# Patient Record
Sex: Female | Born: 1990 | Race: Black or African American | Hispanic: No | Marital: Single | Smoking: Former smoker
Health system: Southern US, Community
[De-identification: ages and names within clinical notes are randomized; demographics above are authoritative.]

## PROBLEM LIST (undated history)

## (undated) ENCOUNTER — Inpatient Hospital Stay (HOSPITAL_COMMUNITY): Payer: Self-pay

## (undated) ENCOUNTER — Inpatient Hospital Stay: Payer: Self-pay

## (undated) DIAGNOSIS — IMO0002 Reserved for concepts with insufficient information to code with codable children: Secondary | ICD-10-CM

## (undated) DIAGNOSIS — E282 Polycystic ovarian syndrome: Principal | ICD-10-CM

## (undated) DIAGNOSIS — D649 Anemia, unspecified: Secondary | ICD-10-CM

## (undated) DIAGNOSIS — O4290 Premature rupture of membranes, unspecified as to length of time between rupture and onset of labor, unspecified weeks of gestation: Secondary | ICD-10-CM

## (undated) DIAGNOSIS — R011 Cardiac murmur, unspecified: Secondary | ICD-10-CM

## (undated) HISTORY — DX: Reserved for concepts with insufficient information to code with codable children: IMO0002

## (undated) HISTORY — DX: Polycystic ovarian syndrome: E28.2

## (undated) HISTORY — DX: Cardiac murmur, unspecified: R01.1

## (undated) HISTORY — DX: Anemia, unspecified: D64.9

## (undated) HISTORY — PX: WISDOM TOOTH EXTRACTION: SHX21

---
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
## 2008-09-27 HISTORY — PX: CHOLECYSTECTOMY: SHX55

## 2008-10-07 ENCOUNTER — Encounter (INDEPENDENT_AMBULATORY_CARE_PROVIDER_SITE_OTHER): Payer: Self-pay | Admitting: Surgery

## 2008-10-07 ENCOUNTER — Ambulatory Visit (HOSPITAL_COMMUNITY): Admission: RE | Admit: 2008-10-07 | Discharge: 2008-10-07 | Payer: Self-pay | Admitting: Surgery

## 2010-04-26 ENCOUNTER — Emergency Department (HOSPITAL_COMMUNITY): Admission: EM | Admit: 2010-04-26 | Discharge: 2010-04-26 | Payer: Self-pay | Admitting: Emergency Medicine

## 2011-02-13 LAB — POCT I-STAT, CHEM 8
Creatinine, Ser: 0.8 mg/dL (ref 0.4–1.2)
Glucose, Bld: 89 mg/dL (ref 70–99)
HCT: 44 % (ref 36.0–46.0)
Potassium: 3.8 mEq/L (ref 3.5–5.1)
Sodium: 138 mEq/L (ref 135–145)
TCO2: 23 mmol/L (ref 0–100)

## 2011-02-13 LAB — URINE MICROSCOPIC-ADD ON

## 2011-02-13 LAB — URINALYSIS, ROUTINE W REFLEX MICROSCOPIC
Glucose, UA: NEGATIVE mg/dL
Ketones, ur: NEGATIVE mg/dL

## 2011-03-15 ENCOUNTER — Emergency Department (HOSPITAL_COMMUNITY)
Admission: EM | Admit: 2011-03-15 | Discharge: 2011-03-16 | Discharge status: Against Medical Advice | Payer: Self-pay | Attending: Emergency Medicine | Admitting: Emergency Medicine

## 2011-03-15 DIAGNOSIS — R109 Unspecified abdominal pain: Secondary | ICD-10-CM | POA: Insufficient documentation

## 2011-03-15 LAB — DIFFERENTIAL
Basophils Absolute: 0 10*3/uL (ref 0.0–0.1)
Basophils Relative: 0 % (ref 0–1)
Eosinophils Absolute: 0.3 10*3/uL (ref 0.0–0.7)
Lymphs Abs: 3.6 10*3/uL (ref 0.7–4.0)
Monocytes Relative: 8 % (ref 3–12)
Neutro Abs: 5.7 10*3/uL (ref 1.7–7.7)
Neutrophils Relative %: 54 % (ref 43–77)

## 2011-03-15 LAB — CBC
HCT: 39 % (ref 36.0–46.0)
Hemoglobin: 13.5 g/dL (ref 12.0–15.0)
MCH: 30.1 pg (ref 26.0–34.0)
MCHC: 34.6 g/dL (ref 30.0–36.0)
RBC: 4.49 MIL/uL (ref 3.87–5.11)
RDW: 12.9 % (ref 11.5–15.5)
WBC: 10.5 10*3/uL (ref 4.0–10.5)

## 2011-03-16 ENCOUNTER — Emergency Department (HOSPITAL_COMMUNITY): Payer: Self-pay

## 2011-03-16 ENCOUNTER — Inpatient Hospital Stay (HOSPITAL_COMMUNITY)
Admission: AD | Admit: 2011-03-16 | Discharge: 2011-03-16 | Disposition: A | Discharge status: Home / Self Care | Payer: Self-pay | Source: Ambulatory Visit | Attending: Obstetrics & Gynecology | Admitting: Obstetrics & Gynecology

## 2011-03-16 ENCOUNTER — Inpatient Hospital Stay (HOSPITAL_COMMUNITY): Payer: Self-pay

## 2011-03-16 DIAGNOSIS — R109 Unspecified abdominal pain: Secondary | ICD-10-CM | POA: Insufficient documentation

## 2011-03-16 LAB — BASIC METABOLIC PANEL
BUN: 5 mg/dL — ABNORMAL LOW (ref 6–23)
Calcium: 8.8 mg/dL (ref 8.4–10.5)
Creatinine, Ser: 0.66 mg/dL (ref 0.4–1.2)
GFR calc Af Amer: >60 mL/min (ref >60)
Glucose, Bld: 86 mg/dL (ref 70–99)
Potassium: 3.5 mEq/L (ref 3.5–5.1)

## 2011-03-16 LAB — POCT PREGNANCY, URINE: Preg Test, Ur: NEGATIVE

## 2011-03-16 LAB — GC/CHLAMYDIA PROBE AMP, GENITAL
Chlamydia, DNA Probe: NEGATIVE
GC Probe Amp, Genital: NEGATIVE

## 2011-03-16 LAB — URINALYSIS, ROUTINE W REFLEX MICROSCOPIC
Ketones, ur: NEGATIVE mg/dL
Leukocytes, UA: NEGATIVE
Protein, ur: NEGATIVE mg/dL
Specific Gravity, Urine: 1.027 (ref 1.005–1.030)
pH: 6 (ref 5.0–8.0)

## 2011-04-11 NOTE — Op Note (Signed)
Sarah Sutton, Sarah Sutton                ACCOUNT NO.:  [id number]   MEDICAL RECORD NO.:  [id number]          PATIENT TYPE:  AMB   LOCATION:  SDS                          FACILITY:  [facility]   PHYSICIAN:  Thomas A. Cornett, M.D.DATE OF BIRTH:  17-Oct-1991   DATE OF PROCEDURE:  10/07/2008  DATE OF DISCHARGE:                               OPERATIVE REPORT   PREOPERATIVE DIAGNOSIS:  Symptomatic cholelithiasis.   POSTOPERATIVE DIAGNOSIS:  Symptomatic cholelithiasis.   PROCEDURE:  Laparoscopic cholecystectomy with intraoperative  cholangiogram.   SURGEON:  Thomas A. Cornett, MD   ANESTHESIA:  General endotracheal anesthesia, 0.25% Sensorcaine local.   ESTIMATED BLOOD LOSS:  20 mL.   SPECIMEN:  Gallbladder gallstone to pathology   INDICATIONS FOR PROCEDURE:  The patient is a 20 year old female with  right upper quadrant pain and gallstones.  She presents for laparoscopic  cholecystectomy for symptomatic cholelithiasis.  Risks of the procedure  were discussed preoperatively with the patient in my office.  She voiced  understanding and agreed to proceed.   DESCRIPTION OF PROCEDURE:  The patient was brought to the operating room  and placed supine.  After induction of general anesthesia, the abdomen  was prepped and draped in the sterile fashion.  An 1-cm infraumbilical  incision was made.  Dissection was carried down to fascia.  Her fascia  was opened in the midline.  Using a small hemostat, I spread the  peritoneal lining open.  Purse-string suture of 0 Vicryl was placed and  a 12-mm Hasson cannula was placed under direct vision.  Pneumoperitoneum  was created, 15 mmHg of CO2 and laparoscope was placed.  She was placed  in reverse Trendelenburg and rolled to her left.  Upon examination of  the abdominal cavity, no significant abnormality was seen except for  some chronic cholecystitis.  An 11-mm subxiphoid port was then placed  under direct vision.  Two 5-mm ports were placed in the right  upper  quadrant.  Gallbladder was grabbed by its dome and retracted to the  patient's right shoulder.  Second grasper was used to grab the  infundibulum port to the patient's right lower quadrant.  The dissector  was used to dissect the cystic duct as the only tubular structure  entering the gallbladder.  Clips were placed in the gallbladder side.  A  small incision was made in the cystic duct for placement of a Cook  cholangiogram catheter, which was placed percutaneously.  The catheter  was placed in the cystic duct and controlled with clips.  Intraoperative  cholangiogram was performed using one-half strength Hypaque dye, which  showed free flow of contrast down the cystic duct and the common bile  duct into the duodenum.  There was free flow of contrast up the common  hepatic duct into the right and left hepatic ducts.  There was mild  dilatation of the biliary system, but no evidence of any retained stone,  stricture, or extravasation.  Catheter was then removed.  Cystic duct  stump was clipped 3 times and divided.  There were some small posterior  branches and cystic artery was  controlled between clips.  The cystic  artery itself was controlled between clips.  Cautery was used to dissect  the gallbladder from the gallbladder fossa without difficulty.  Gallbladder was placed in an Endocatch bag.  Gallbladder bed was  examined.  There was a little bit of oozing, controlled with cautery and  Surgicel.  Gallbladder bile was extracted through the umbilicus and  passed off the field.  Excess irrigation was suctioned out.  No evidence  of bleeding or bile leakage.  The patient was then flattened out.  CO2  was allowed escape.  Upon examining of the abdominal cavity, it showed  no evidence of injury to small bowel, large bowel, liver, or stomach.  Once ports were withdrawn, there was no evidence of any port site  bleeding.  With the CO2 escape, the port was close with removal of   gallbladder using the purse-string suture of 0 Vicryl under direct  vision.  At this point in time once all ports were removed, 4-0 Monocryl  was used to close all skin incisions.  Dermabond was applied to all skin  incisions.  All final counts of sponge, needle, and instrument were  found to be correct at this portion of the case.  The patient was awoke  and taken to the recovery room in satisfactory condition.      Thomas A. Cornett, M.D.  Electronically Signed     TAC/MEDQ  D:  10/07/2008  T:  10/08/2008  Job:  [id number]   cc:   Kathreen Cosier, M.D.

## 2011-06-16 ENCOUNTER — Ambulatory Visit (INDEPENDENT_AMBULATORY_CARE_PROVIDER_SITE_OTHER): Payer: Self-pay | Admitting: Advanced Practice Midwife

## 2011-06-16 ENCOUNTER — Encounter: Payer: Self-pay | Admitting: Obstetrics and Gynecology

## 2011-06-16 DIAGNOSIS — N643 Galactorrhea not associated with childbirth: Secondary | ICD-10-CM

## 2011-06-16 DIAGNOSIS — E282 Polycystic ovarian syndrome: Secondary | ICD-10-CM

## 2011-06-16 DIAGNOSIS — Z202 Contact with and (suspected) exposure to infections with a predominantly sexual mode of transmission: Secondary | ICD-10-CM

## 2011-06-16 MED ORDER — METFORMIN HCL 500 MG PO TABS: 500.0000 mg | ORAL_TABLET | Freq: Two times a day (BID) | ORAL | Status: DC

## 2011-06-16 NOTE — Patient Instructions (Signed)
Polycystic Ovarian Syndrome (PCOS) Polycystic ovarian syndrome is a condition with a number of problems. One problem is with the ovaries. The ovaries are organs located in the female pelvis, on each side of the uterus. Usually, during the menstrual cycle, an egg is released from 1 ovary every month. This is called ovulation. When the egg is fertilized, it goes into the womb (uterus), which allows for the growth of a baby. The egg travels from the ovary through the fallopian tube to the uterus. The ovaries also make the hormones estrogen and progesterone. These hormones help the development of a woman's breasts, body shape, and body hair. They also regulate the menstrual cycle and pregnancy. Sometimes, cysts form in the ovaries. A cyst is a fluid-filled sac. On the ovary, different types of cysts can form. The most common type of ovarian cyst is called a functional or ovulation cyst. It is normal, and often forms during the normal menstrual cycle. Each month, a woman's ovaries grow tiny cysts that hold the eggs. When an egg is fully grown, the sac breaks open. This releases the egg. Then, the sac which released the egg from the ovary dissolves. In one type of functional cyst, called a follicle cyst, the sac does not break open to release the egg. It may actually continue to grow. This type of cyst usually disappears within 1 to 3 months.  One type of cyst problem with the ovaries is called Polycystic Ovarian Syndrome (PCOS). In this condition, many follicle cysts form, but do not rupture and produce an egg. This health problem can affect the following:  Menstrual cycle.  Heart.   Obesity.   Cancer of the uterus.   Fertility.  Blood vessels.   Hair growth (face and body) or baldness.   Hormones.  Appearance.   High blood pressure.   Stroke.   Insulin production.  Inflammation of the liver.   Elevated blood cholesterol and triglycerides.   SYMPTOMS  Infrequent or no menstrual periods,  and/or irregular bleeding.   Inability to get pregnant (infertility), because of not ovulating.   Increased growth of hair on the face, chest, stomach, back, thumbs, thighs, or toes.   Acne, oily skin, or dandruff.   Pelvic pain.   Weight gain or obesity, usually carrying extra weight around the waist.   Type 2 diabetes (this is the diabetes that usually does not need insulin).   High cholesterol.   High blood pressure.   Female-pattern baldness or thinning hair.   Patches of thickened and dark brown or black skin on the neck, arms, breasts, or thighs.   Skin tags, or tiny excess flaps of skin, in the armpits or neck area.   Sleep apnea (excessive snoring and breathing stops at times while asleep).   Deepening of the voice.   Gestational diabetes when pregnant.   Increased risk of miscarriage with pregnancy.  WOMEN WITH PCOS HAVE THESE CHARACTERISTICS:  High levels of female hormones called androgens.   An irregular or no menstrual cycle.   May have many small cysts in their ovaries.   An estimated 5 to 10 percent of women of childbearing age have PCOS.  PCOS is the most common hormonal reproductive problem in women of childbearing age. CAUSES  No one knows the exact cause of PCOS.   Women with PCOS often have a mother or sister with PCOS. There is not yet enough proof to say this is inherited.   Many women with PCOS have a weight problem.     Researchers are looking at the relationship between PCOS and the body's ability to make insulin. Insulin is a hormone that regulates the change of sugar, starches, and other food into energy for the body's use, or for storage. Some women with PCOS make too much insulin. It is possible that the ovaries react by making too many female hormones, called androgens. This can lead to acne, excessive hair growth, weight gain, and ovulation problems.   Too much production of luteinizing hormone (LH) from the pituitary gland in the brain  stimulates the ovary to produce too much female hormone (androgen).  WHY DO WOMEN WITH PCOS HAVE TROUBLE WITH THEIR MENSTRUAL CYCLE? Each month, about 20 eggs start to mature in the ovaries. As one egg grows and matures, the follicle breaks open to release the egg, so it can travel through the fallopian tube for fertilization. When the single egg leaves the follicle, ovulation takes place. In women with PCOS, the ovary does not make all of the hormones it needs for any of the eggs to fully mature. They may start to grow and accumulate fluid, but no one egg becomes large enough. Instead, some may remain as cysts. Since no egg matures or is released, ovulation does not occur and the hormone progesterone is not made. Without progesterone, a woman's menstrual cycle is irregular or absent. Also, the cysts produce female hormones, which continue to prevent ovulation.  DIAGNOSIS There is no single test to diagnose PCOS.   Your caregiver will:   Take a medical history.  Perform a pelvic exam.   Perform an ultrasound.   Check your female and female hormone levels.  Measure glucose or sugar levels in the blood.   Do other blood tests.    If you are producing too many female hormones, your caregiver will make sure it is from PCOS. At the physical exam, your caregiver will want to evaluate the areas of increased hair growth. Try to allow natural hair growth for a few days before the visit.   During a pelvic exam, the ovaries may be enlarged or swollen by the increased number of small cysts. This can be seen more easily by vaginal ultrasound or screening, to examine the ovaries and lining of the uterus (endometrium) for cysts. The uterine lining may become thicker, if there has not been a regular period.  TREATMENT Because there is no cure for PCOS, it needs to be managed to prevent problems. Treatments are based on your symptoms. Treatment is also based on whether you want to have a baby or whether you need  contraception.  Treatment may include:  Progesterone hormone, to start a menstrual period.   Birth control pills, to make you have regular menstrual periods.   Medicines to make you ovulate, if you want to get pregnant.   Medicines to control your insulin.   Medicine to control your blood pressure.   Medicine and diet, to control your high cholesterol and triglycerides in your blood.   Surgery, making small holes in the ovary, to decrease the amount of female hormone production. This is done through a long, lighted tube (laparoscope), placed into the pelvis through a tiny incision in the lower abdomen.  Your caregiver will go over some of the choices with you. Document Released: 03/09/2005 Document Re-Released: 05/03/2010 [employer] Patient Information 2011 [employer]. 

## 2011-06-16 NOTE — Progress Notes (Signed)
  Subjective:    Patient ID: Sarah Sutton, female    DOB: 1991-07-11, 20 y.o.   MRN: [id number]  HPI Presents with c/o irregular cycles and right nipple discharge. Had a period in February and the next was not until May.  She then had one in June. She had had a long history of missing periods occasionally.  She has been Rxed Metformin before by Dr Gaynell Face who thought she had PCOS but never took it.  She noticed a tiny amount of nipple discharge and started squeezing her nipples to "get it out". After that the discharge was more copious. She does this daily.  Also reports was treated for Trich at [facility].  Review of Systems Right Nipple discharge Irregular cycles with some abdominal discomfort before period.     Objective:   Physical Exam  Constitutional: She appears well-developed and well-nourished.  HENT:  Head: Normocephalic.  Abdominal: Soft.  Neurological: She is alert.  Skin: Skin is warm and dry.  Psychiatric: She has a normal mood and affect.   [facility] Pel[facility] exam deferred today.      Assessment & Plan:  A:  Oligomenorrhea probably related to PCOS Galactorrhea, probably related to hormonal shifts and exacerbated by stimulation, but need to r/o pituitary tumor  P:  PCOS labs, including Testosterone Prolactin to be done with above labs in One week (pt instructed to cease nipple stimulation) Metformin 500mg  bid.  When she comes back for her results visit, need to order her the increased dose of Metformin 1000mg  bid Discussed PCOS and nipple discharge in detail.  Return 2 wks after labs for results  Declines contraceptive Rx ... Pt wishes to become pregnant

## 2011-06-22 ENCOUNTER — Other Ambulatory Visit: Payer: Self-pay

## 2011-06-23 ENCOUNTER — Other Ambulatory Visit: Payer: Self-pay

## 2011-06-23 DIAGNOSIS — E282 Polycystic ovarian syndrome: Secondary | ICD-10-CM

## 2011-06-24 LAB — LUTEINIZING HORMONE: LH: 7.1 m[IU]/mL

## 2011-06-24 LAB — PROLACTIN: Prolactin: 40.8 ng/mL

## 2011-06-24 LAB — ESTRADIOL: Estradiol: 121.4 pg/mL

## 2011-06-24 LAB — TESTOSTERONE: Testosterone: 69.19 ng/dL (ref 10–70)

## 2011-07-03 ENCOUNTER — Telehealth: Payer: Self-pay | Admitting: *Deleted

## 2011-07-03 NOTE — Telephone Encounter (Signed)
Spoke w/pt. I told her that she needs return appt for results per Dr. Serita Kyle not from 06/16/11. I am not able to interpret the results. I will have our scheduling staff call her tomorrow with appt date and time. Pt voiced understanding.

## 2011-07-06 NOTE — Telephone Encounter (Signed)
Called pt and left voicemail with appointment date and time.

## 2011-08-09 ENCOUNTER — Ambulatory Visit: Payer: Self-pay | Admitting: Obstetrics and Gynecology

## 2011-08-11 ENCOUNTER — Encounter: Payer: Self-pay | Admitting: Advanced Practice Midwife

## 2011-08-11 ENCOUNTER — Ambulatory Visit (INDEPENDENT_AMBULATORY_CARE_PROVIDER_SITE_OTHER): Payer: Self-pay | Admitting: Advanced Practice Midwife

## 2011-08-11 DIAGNOSIS — N6459 Other signs and symptoms in breast: Secondary | ICD-10-CM

## 2011-08-11 DIAGNOSIS — N926 Irregular menstruation, unspecified: Secondary | ICD-10-CM

## 2011-08-11 DIAGNOSIS — N6452 Nipple discharge: Secondary | ICD-10-CM

## 2011-08-11 DIAGNOSIS — E282 Polycystic ovarian syndrome: Secondary | ICD-10-CM

## 2011-08-11 LAB — PROLACTIN: Prolactin: 9.6 ng/mL

## 2011-08-11 NOTE — Progress Notes (Signed)
States just had a very light period ,not her normal period. Also c/o breasts leaking milk since february

## 2011-08-11 NOTE — Progress Notes (Signed)
  Subjective:    Patient ID: Sarah Sutton, female    DOB: 11-14-1991, 20 y.o.   MRN: [id number]  HPI; results visit for PCOS eval 7/12. 2 Monthly cycles since last visit, but LMP light. Galactorrhea continues. Does not strongly desire pregnancy, but does not want to prevent it either. Not using contraception. Some diarrhea w/ Metformin, but does not always take w/ food.   Review of Systems Otherwise neg    Objective:   Physical Exam  Recent Results (from the past 2016 hour(s))  PROLACTIN   Collection Time   06/23/11 11:04 AM      Component Value Range   Prolactin 40.8    TESTOSTERONE   Collection Time   06/23/11 11:04 AM      Component Value Range   Testosterone 69.19  10 - 70 (ng/dL)  FOLLICLE STIMULATING HORMONE   Collection Time   06/23/11 11:04 AM      Component Value Range   FSH 2.3    LUTEINIZING HORMONE   Collection Time   06/23/11 11:04 AM      Component Value Range   LH 7.1    ESTRADIOL   Collection Time   06/23/11 11:04 AM      Component Value Range   Estradiol 121.4      Assessment & Plan:  Assessment: 1. Hyperprolactinemia 2. PCOS  Plan: 1. Per consult w/ Dr. Macon Large increase Metformin to 1500 mg PO QD (pt has adequate refills). Pt educated that this may increase her fertility. Pt verbalizes understanding and wishes to proceed w/ dose change. 2. TSH. HgbA1C, and fasting Prolactin drawn 3. Will call w/ results and schedule F/U

## 2011-08-13 MED ORDER — METFORMIN HCL 500 MG PO TABS: 500.0000 mg | ORAL_TABLET | Freq: Three times a day (TID) | ORAL | Status: DC

## 2011-08-29 LAB — COMPREHENSIVE METABOLIC PANEL
Albumin: 3.7
BUN: 6
CO2: 28
Calcium: 9.5
Chloride: 106
Creatinine, Ser: 0.65
GFR calc non Af Amer: >60
Total Bilirubin: 0.4

## 2011-08-29 LAB — PREGNANCY, URINE: Preg Test, Ur: NEGATIVE

## 2011-08-29 LAB — CBC
HCT: 41.2
MCHC: 33.3
MCV: 88
Platelets: 315
WBC: 8.6

## 2011-08-29 LAB — DIFFERENTIAL
Basophils Absolute: 0.1
Lymphocytes Relative: 44
Lymphs Abs: 3.8
Neutro Abs: 4

## 2011-09-11 LAB — POCT I-STAT CREATININE: Creatinine, Ser: 0.8

## 2011-09-11 LAB — URINALYSIS, ROUTINE W REFLEX MICROSCOPIC
Leukocytes, UA: NEGATIVE
Leukocytes, UA: NEGATIVE
Nitrite: NEGATIVE
Protein, ur: NEGATIVE
Protein, ur: NEGATIVE
Specific Gravity, Urine: 1.02
Urobilinogen, UA: 1
Urobilinogen, UA: 1

## 2011-09-11 LAB — GC/CHLAMYDIA PROBE AMP, GENITAL
Chlamydia, DNA Probe: NEGATIVE
GC Probe Amp, Genital: NEGATIVE

## 2011-09-11 LAB — POCT PREGNANCY, URINE
Operator id: 288331
Preg Test, Ur: NEGATIVE

## 2011-09-11 LAB — I-STAT 8, (EC8 V) (CONVERTED LAB)
Acid-Base Excess: 4 — ABNORMAL HIGH
Chloride: 101
Potassium: 3.1 — ABNORMAL LOW
Sodium: 137
pH, Ven: 7.389 — ABNORMAL HIGH

## 2011-09-11 LAB — URINE MICROSCOPIC-ADD ON

## 2011-09-11 LAB — WET PREP, GENITAL: WBC, Wet Prep HPF POC: NONE SEEN

## 2011-09-11 LAB — PREGNANCY, URINE: Preg Test, Ur: NEGATIVE

## 2011-11-20 ENCOUNTER — Emergency Department (HOSPITAL_COMMUNITY)
Admission: EM | Admit: 2011-11-20 | Discharge: 2011-11-20 | Disposition: A | Discharge status: Home / Self Care | Payer: Self-pay | Source: Home / Self Care | Attending: Family Medicine | Admitting: Family Medicine

## 2011-11-22 ENCOUNTER — Emergency Department (INDEPENDENT_AMBULATORY_CARE_PROVIDER_SITE_OTHER)
Admission: EM | Admit: 2011-11-22 | Discharge: 2011-11-22 | Disposition: A | Discharge status: Hospice | Payer: Self-pay | Source: Home / Self Care | Attending: Emergency Medicine | Admitting: Emergency Medicine

## 2011-11-22 ENCOUNTER — Encounter (HOSPITAL_COMMUNITY): Payer: Self-pay | Admitting: *Deleted

## 2011-11-22 DIAGNOSIS — R1110 Vomiting, unspecified: Secondary | ICD-10-CM

## 2011-11-22 DIAGNOSIS — J069 Acute upper respiratory infection, unspecified: Secondary | ICD-10-CM

## 2011-11-22 LAB — POCT URINALYSIS DIP (DEVICE)
Glucose, UA: NEGATIVE mg/dL
Leukocytes, UA: NEGATIVE
Nitrite: NEGATIVE
pH: 6 (ref 5.0–8.0)

## 2011-11-22 LAB — POCT PREGNANCY, URINE: Preg Test, Ur: NEGATIVE

## 2011-11-22 MED ORDER — GUAIFENESIN-CODEINE 100-10 MG/5ML PO SYRP: 5.0000 mL | ORAL_SOLUTION | Freq: Three times a day (TID) | ORAL | Status: AC | PRN

## 2011-11-22 MED ORDER — ONDANSETRON HCL 4 MG PO TABS: 4.0000 mg | ORAL_TABLET | Freq: Four times a day (QID) | ORAL | Status: AC

## 2011-11-22 NOTE — ED Provider Notes (Signed)
History     CSN: [id number]  Arrival date & time 11/22/11  5956   First MD Initiated Contact with Patient 11/22/11 [phone]      Chief Complaint  Patient presents with  . Cough    (Consider location/radiation/quality/duration/timing/severity/associated sxs/prior treatment) HPI Comments: Cough, and congested for several days, still coughing tried OTC medicines and not getting any better, No SOB  Patient is a 20 y.o. female presenting with cough. The history is provided by the patient.  Cough This is a new problem. The current episode started more than 2 days ago. The problem occurs hourly. The problem has not changed since onset.The cough is non-productive. The maximum temperature recorded prior to her arrival was 100 to 100.9 F. Associated symptoms include rhinorrhea. Pertinent negatives include no chest pain, no chills, no sweats, no ear congestion, no shortness of breath and no wheezing. She has tried decongestants and cough syrup for the symptoms. The treatment provided mild relief. She is not a smoker. Her past medical history does not include bronchitis, pneumonia, COPD or asthma.    Past Medical History  Diagnosis Date  . Polycystic ovarian syndrome 2009    by Dr. Gaynell Face  . Diabetes mellitus     Past Surgical History  Procedure Date  . Cholecystectomy november 2009  . Colposcopy 2009    Family History  Problem Relation Age of Onset  . Asthma Mother   . Diabetes Father   . Hypertension Father   . Cancer Maternal Grandmother     bone cancer  . Cancer Maternal Grandfather     breast cancer    History  Substance Use Topics  . Smoking status: Current Everyday Smoker -- 0.5 packs/day    Types: Cigarettes  . Smokeless tobacco: Never Used  . Alcohol Use: No    OB History    Grav Para Term Preterm Abortions TAB SAB Ect Mult Living   0               Review of Systems  Constitutional: Negative for chills.  HENT: Positive for rhinorrhea.   Respiratory:  Positive for cough. Negative for shortness of breath and wheezing.   Cardiovascular: Negative for chest pain.    Allergies  Review of patient's allergies indicates no known allergies.  Home Medications   Current Outpatient Rx  Name Route Sig Dispense Refill  . GUAIFENESIN-CODEINE 100-10 MG/5ML PO SYRP Oral Take 5 mLs by mouth 3 (three) times daily as needed for cough. 120 mL 0  . METFORMIN HCL 500 MG PO TABS Oral Take 1 tablet (500 mg total) by mouth 3 (three) times daily with meals. 1 tablet 0  . ONDANSETRON HCL 4 MG PO TABS Oral Take 1 tablet (4 mg total) by mouth every 6 (six) hours. 12 tablet 0    BP 102/66  Pulse 79  Temp(Src) 97.9 F (36.6 C) (Oral)  Resp 16  SpO2 99%  LMP 10/18/2011  Physical Exam  Nursing note and vitals reviewed. Constitutional: She appears well-developed and well-nourished. No distress.  HENT:  Head: Normocephalic.  Mouth/Throat: Uvula is midline, oropharynx is clear and moist and mucous membranes are normal.  Eyes: Conjunctivae are normal.  Neck: Normal range of motion. No JVD present. No tracheal deviation present.  Cardiovascular: Normal rate.   No murmur heard. Pulmonary/Chest: Breath sounds normal. No respiratory distress. She has no decreased breath sounds. She has no wheezes. She has no rhonchi. She has no rales.  Abdominal: Soft.  Lymphadenopathy:    She  has no cervical adenopathy.    ED Course  Procedures (including critical care time)  Labs Reviewed  POCT URINALYSIS DIP (DEVICE) - Abnormal; Notable for the following:    Bilirubin Urine SMALL (*)    Hgb urine dipstick MODERATE (*)    All other components within normal limits  POCT PREGNANCY, URINE   No results found.   1. Upper respiratory infection   2. Vomiting       MDM  URI normal  lung exam-comfortable        Jimmie Molly, MD 11/22/11 [phone]

## 2011-11-22 NOTE — ED Notes (Signed)
Pt  Has     Cough  /  Congested      Aching  All  Over     Reports  Had  Fever  Several  Days  Ago       symptoms  unreleived  By otc meds  By  otc  meds

## 2011-11-28 NOTE — L&D Delivery Note (Signed)
Delivery Note Pt complete at 2325.  Pushed with approximately 5 ctxs to SVD at 11:47 PM.  A viable female "Sarah Sutton" was delivered via Vaginal, Spontaneous Delivery (Presentation: Left Occiput Anterior).  APGAR: 5, 7; weight 4 lb 11.2 oz (2132 g).  Double nuchal noted and loose; reduced over head prior to delivery of body.  Newborn immediately placed on mom's abdomen where dried and stimulated, w/ spontaneous shallow cry, and slow to transition; cord doubly clamped and [name] cut cord, and newborn transferred to warmer where NICU awaited.   Placenta status: Intact, Spontaneous, Schultz.  Cord: 3 vessels with the following complications: None.  Cord pH: not collected.  Anesthesia: epidural Episiotomy: None Lacerations: None Suture Repair: n/a Est. Blood Loss (mL): 350 Placenta w/ delay in delivery, and after delivered, fundus boggy intermittently.  Placed of cytotec pr.  Placenta sent to path.   Mom to postpartum 3rd floor, and will CTO BP's closely.  Baby to NICU. Pt plans to breastfeed, pump initially.    [name] 10/01/2012, 3:58 AM

## 2012-03-15 ENCOUNTER — Inpatient Hospital Stay (HOSPITAL_COMMUNITY)
Admission: AD | Admit: 2012-03-15 | Discharge: 2012-03-15 | Disposition: A | Discharge status: Home / Self Care | Payer: 59 | Source: Ambulatory Visit | Attending: Obstetrics & Gynecology | Admitting: Obstetrics & Gynecology

## 2012-03-15 ENCOUNTER — Encounter (HOSPITAL_COMMUNITY): Payer: Self-pay | Admitting: *Deleted

## 2012-03-15 ENCOUNTER — Inpatient Hospital Stay (HOSPITAL_COMMUNITY): Payer: 59

## 2012-03-15 DIAGNOSIS — O26899 Other specified pregnancy related conditions, unspecified trimester: Secondary | ICD-10-CM

## 2012-03-15 DIAGNOSIS — O99891 Other specified diseases and conditions complicating pregnancy: Secondary | ICD-10-CM | POA: Insufficient documentation

## 2012-03-15 DIAGNOSIS — R1011 Right upper quadrant pain: Secondary | ICD-10-CM | POA: Insufficient documentation

## 2012-03-15 DIAGNOSIS — R109 Unspecified abdominal pain: Secondary | ICD-10-CM

## 2012-03-15 LAB — URINALYSIS, ROUTINE W REFLEX MICROSCOPIC
Nitrite: NEGATIVE
Protein, ur: NEGATIVE mg/dL
Urobilinogen, UA: 0.2 mg/dL (ref 0.0–1.0)

## 2012-03-15 LAB — URINE MICROSCOPIC-ADD ON

## 2012-03-15 LAB — CBC
HCT: 37.7 % (ref 36.0–46.0)
MCHC: 34.2 g/dL (ref 30.0–36.0)
RDW: 13.5 % (ref 11.5–15.5)

## 2012-03-15 LAB — HCG, QUANTITATIVE, PREGNANCY: hCG, Beta Chain, Quant, S: 3335 m[IU]/mL — ABNORMAL HIGH (ref <5)

## 2012-03-15 LAB — POCT PREGNANCY, URINE: Preg Test, Ur: POSITIVE — AB

## 2012-03-15 NOTE — MAU Note (Signed)
Pt states she has had some abdominal pain and pt states she is pregnant

## 2012-03-15 NOTE — MAU Provider Note (Signed)
Attestation of Attending Supervision of Advanced Practitioner: Evaluation and management procedures were performed by the OB Fellow/PA/CNM/NP under my supervision and collaboration. Chart reviewed, and agree with management and plan.  Lynne Takemoto, M.D. 03/15/2012 2:15 PM   

## 2012-03-15 NOTE — MAU Provider Note (Signed)
History     CSN: [id number]  Arrival date and time: 03/15/12 0011   None     Chief Complaint  Patient presents with  . Abdominal Pain   HPI 21 y.o. G1P0 at Unknown EGA with RUQ pain earlier tonight, now resolved, + UPT at home, h/o irregular cycles/PCOS. No bleeding or discharge.    Past Medical History  Diagnosis Date  . Polycystic ovarian syndrome 2009    by Dr. Gaynell Face  . Diabetes mellitus     Past Surgical History  Procedure Date  . Cholecystectomy november 2009  . Colposcopy 2009    Family History  Problem Relation Age of Onset  . Asthma Mother   . Diabetes Father   . Hypertension Father   . Cancer Maternal Grandmother     bone cancer  . Cancer Maternal Grandfather     breast cancer    History  Substance Use Topics  . Smoking status: Former Smoker -- 0.5 packs/day    Types: Cigarettes    Quit date: 03/14/2012  . Smokeless tobacco: Never Used  . Alcohol Use: No    Allergies: No Known Allergies  No prescriptions prior to admission    Review of Systems  Constitutional: Negative.   Respiratory: Negative.   Cardiovascular: Negative.   Gastrointestinal: Positive for abdominal pain. Negative for nausea, vomiting, diarrhea and constipation.  Genitourinary: Negative for dysuria, urgency, frequency, hematuria and flank pain.       Negative for vaginal bleeding, vaginal discharge, dyspareunia  Musculoskeletal: Negative.   Neurological: Negative.   Psychiatric/Behavioral: Negative.    Physical Exam   Blood pressure 128/76, pulse 68, temperature 98 F (36.7 C), temperature source Oral, resp. rate 18, last menstrual period 01/12/2012.  Physical Exam  Nursing note and vitals reviewed. Constitutional: She is oriented to person, place, and time. She appears well-developed and well-nourished. No distress.  Cardiovascular: Normal rate.   Respiratory: Effort normal.  GI: Soft. There is no tenderness.  Musculoskeletal: Normal range of motion.    Neurological: She is alert and oriented to person, place, and time.  Skin: Skin is warm and dry.  Psychiatric: She has a normal mood and affect.    MAU Course  Procedures  Results for orders placed during the hospital encounter of 03/15/12 (from the past 24 hour(s))  URINALYSIS, ROUTINE W REFLEX MICROSCOPIC     Status: Abnormal   Collection Time   03/15/12 12:27 AM      Component Value Range   Color, Urine YELLOW  YELLOW    APPearance CLEAR  CLEAR    Specific Gravity, Urine >1.030 (*) 1.005 - 1.030    pH 6.0  5.0 - 8.0    Glucose, UA NEGATIVE  NEGATIVE (mg/dL)   Hgb urine dipstick SMALL (*) NEGATIVE    Bilirubin Urine NEGATIVE  NEGATIVE    Ketones, ur NEGATIVE  NEGATIVE (mg/dL)   Protein, ur NEGATIVE  NEGATIVE (mg/dL)   Urobilinogen, UA 0.2  0.0 - 1.0 (mg/dL)   Nitrite NEGATIVE  NEGATIVE    Leukocytes, UA NEGATIVE  NEGATIVE   URINE MICROSCOPIC-ADD ON     Status: Abnormal   Collection Time   03/15/12 12:27 AM      Component Value Range   Squamous Epithelial / LPF MANY (*) RARE    RBC / HPF 0-2  <3 (RBC/hpf)  POCT PREGNANCY, URINE     Status: Abnormal   Collection Time   03/15/12  1:22 AM      Component Value Range  Preg Test, Ur POSITIVE (*) NEGATIVE   CBC     Status: Normal   Collection Time   03/15/12  1:45 AM      Component Value Range   WBC 10.4  4.0 - 10.5 (K/uL)   RBC 4.32  3.87 - 5.11 (MIL/uL)   Hemoglobin 12.9  12.0 - 15.0 (g/dL)   HCT 45.4  09.8 - 11.9 (%)   MCV 87.3  78.0 - 100.0 (fL)   MCH 29.9  26.0 - 34.0 (pg)   MCHC 34.2  30.0 - 36.0 (g/dL)   RDW 14.7  82.9 - 56.2 (%)   Platelets 298  150 - 400 (K/uL)  ABO/RH     Status: Normal   Collection Time   03/15/12  1:45 AM      Component Value Range   ABO/RH(D) A POS    HCG, QUANTITATIVE, PREGNANCY     Status: Abnormal   Collection Time   03/15/12  1:45 AM      Component Value Range   hCG, Beta Chain, Quant, S 3335 (*) <5 (mIU/mL)   U/S: 5.3 week IUGS, + yolk sac, no fetal pole   Declines pelvic  exam  Assessment and Plan  21 y.o. G1P0 at 5.[redacted] weeks EGA Start prenatal care ASAP  Sarah Sutton 03/15/2012, 2:57 AM

## 2012-04-04 ENCOUNTER — Inpatient Hospital Stay (HOSPITAL_COMMUNITY)
Admission: AD | Admit: 2012-04-04 | Discharge: 2012-04-05 | Disposition: A | Discharge status: Home / Self Care | Payer: 59 | Source: Ambulatory Visit | Attending: Obstetrics and Gynecology | Admitting: Obstetrics and Gynecology

## 2012-04-04 ENCOUNTER — Encounter (HOSPITAL_COMMUNITY): Payer: Self-pay | Admitting: *Deleted

## 2012-04-04 DIAGNOSIS — N760 Acute vaginitis: Secondary | ICD-10-CM

## 2012-04-04 DIAGNOSIS — A499 Bacterial infection, unspecified: Secondary | ICD-10-CM | POA: Insufficient documentation

## 2012-04-04 DIAGNOSIS — O209 Hemorrhage in early pregnancy, unspecified: Secondary | ICD-10-CM

## 2012-04-04 DIAGNOSIS — O26859 Spotting complicating pregnancy, unspecified trimester: Secondary | ICD-10-CM | POA: Insufficient documentation

## 2012-04-04 DIAGNOSIS — O2390 Unspecified genitourinary tract infection in pregnancy, unspecified trimester: Secondary | ICD-10-CM | POA: Insufficient documentation

## 2012-04-04 DIAGNOSIS — B9689 Other specified bacterial agents as the cause of diseases classified elsewhere: Secondary | ICD-10-CM | POA: Insufficient documentation

## 2012-04-04 NOTE — MAU Note (Signed)
Pt reports vaginal bleeding/spotting x 4 hours. Denies pain.

## 2012-04-05 ENCOUNTER — Inpatient Hospital Stay (HOSPITAL_COMMUNITY): Payer: 59

## 2012-04-05 ENCOUNTER — Encounter (HOSPITAL_COMMUNITY): Payer: Self-pay | Admitting: *Deleted

## 2012-04-05 DIAGNOSIS — N760 Acute vaginitis: Secondary | ICD-10-CM

## 2012-04-05 DIAGNOSIS — O208 Other hemorrhage in early pregnancy: Secondary | ICD-10-CM

## 2012-04-05 LAB — URINE MICROSCOPIC-ADD ON

## 2012-04-05 LAB — URINALYSIS, ROUTINE W REFLEX MICROSCOPIC
Bilirubin Urine: NEGATIVE
Glucose, UA: NEGATIVE mg/dL
Ketones, ur: NEGATIVE mg/dL
Leukocytes, UA: NEGATIVE
Protein, ur: NEGATIVE mg/dL
pH: 6 (ref 5.0–8.0)

## 2012-04-05 LAB — GC/CHLAMYDIA PROBE AMP, GENITAL
Chlamydia, DNA Probe: NEGATIVE
GC Probe Amp, Genital: NEGATIVE

## 2012-04-05 LAB — WET PREP, GENITAL
Trich, Wet Prep: NONE SEEN
Yeast Wet Prep HPF POC: NONE SEEN

## 2012-04-05 MED ORDER — METRONIDAZOLE 500 MG PO TABS: 500.0000 mg | ORAL_TABLET | Freq: Two times a day (BID) | ORAL | Status: AC

## 2012-04-05 NOTE — Discharge Instructions (Signed)
Bacterial Vaginosis Bacterial vaginosis (BV) is a vaginal infection where the normal balance of bacteria in the vagina is disrupted. The normal balance is then replaced by an overgrowth of certain bacteria. There are several different kinds of bacteria that can cause BV. BV is the most common vaginal infection in women of childbearing age. CAUSES   The cause of BV is not fully understood. BV develops when there is an increase or imbalance of harmful bacteria.   Some activities or behaviors can upset the normal balance of bacteria in the vagina and put women at increased risk including:   Having a new sex partner or multiple sex partners.   Douching.   Using an intrauterine device (IUD) for contraception.   It is not clear what role sexual activity plays in the development of BV. However, women that have never had sexual intercourse are rarely infected with BV.  Women do not get BV from toilet seats, bedding, swimming pools or from touching objects around them.  SYMPTOMS   Grey vaginal discharge.   A fish-like odor with discharge, especially after sexual intercourse.   Itching or burning of the vagina and vulva.   Burning or pain with urination.   Some women have no signs or symptoms at all.  DIAGNOSIS  Your caregiver must examine the vagina for signs of BV. Your caregiver will perform lab tests and look at the sample of vaginal fluid through a microscope. They will look for bacteria and abnormal cells (clue cells), a pH test higher than 4.5, and a positive amine test all associated with BV.  RISKS AND COMPLICATIONS   Pelvic inflammatory disease (PID).   Infections following gynecology surgery.   Developing HIV.   Developing herpes virus.  TREATMENT  Sometimes BV will clear up without treatment. However, all women with symptoms of BV should be treated to avoid complications, especially if gynecology surgery is planned. Female partners generally do not need to be treated. However,  BV may spread between female sex partners so treatment is helpful in preventing a recurrence of BV.   BV may be treated with antibiotics. The antibiotics come in either pill or vaginal cream forms. Either can be used with nonpregnant or pregnant women, but the recommended dosages differ. These antibiotics are not harmful to the baby.   BV can recur after treatment. If this happens, a second round of antibiotics will often be prescribed.   Treatment is important for pregnant women. If not treated, BV can cause a premature delivery, especially for a pregnant woman who had a premature birth in the past. All pregnant women who have symptoms of BV should be checked and treated.   For chronic reoccurrence of BV, treatment with a type of prescribed gel vaginally twice a week is helpful.  HOME CARE INSTRUCTIONS   Finish all medication as directed by your caregiver.   Do not have sex until treatment is completed.   Tell your sexual partner that you have a vaginal infection. They should see their caregiver and be treated if they have problems, such as a mild rash or itching.   Practice safe sex. Use condoms. Only have 1 sex partner.  PREVENTION  Basic prevention steps can help reduce the risk of upsetting the natural balance of bacteria in the vagina and developing BV:  Do not have sexual intercourse (be abstinent).   Do not douche.   Use all of the medicine prescribed for treatment of BV, even if the signs and symptoms go away.     Tell your sex partner if you have BV. That way, they can be treated, if needed, to prevent reoccurrence.  SEEK MEDICAL CARE IF:   Your symptoms are not improving after 3 days of treatment.   You have increased discharge, pain, or fever.  MAKE SURE YOU:   Understand these instructions.   Will watch your condition.   Will get help right away if you are not doing well or get worse.  FOR MORE INFORMATION  Division of STD Prevention (DSTDP), Centers for Disease  Control and Prevention: [employer] American Social Health Association (ASHA): www.ashastd.org  Document Released: 11/13/2005 Document Revised: 11/02/2011 Document Reviewed: 05/06/2009 [facility] Patient Information 2012 [place], [place]. Vaginal Bleeding During Pregnancy, First Trimester A small amount of bleeding (spotting) is relatively common in early pregnancy. It usually stops on its own. There are many causes for bleeding or spotting in early pregnancy. Some bleeding may be related to the pregnancy and some may not. Cramping with the bleeding is more serious and concerning. Tell your caregiver if you have any vaginal bleeding.  CAUSES   It is normal in most cases.   The pregnancy ends (miscarriage).   The pregnancy may end (threatened miscarriage).   Infection or inflammation of the cervix.   Growths (polyps) on the cervix.   Pregnancy happens outside of the uterus and in a fallopian tube (tubal pregnancy).   Many tiny cysts in the uterus instead of pregnancy tissue (molar pregnancy).  SYMPTOMS  Vaginal bleeding or spotting with or without cramps. DIAGNOSIS  To evaluate the pregnancy, your caregiver may:  Do a pelvic exam.   Take blood tests.   Do an ultrasound.  It is very important to follow your caregiver's instructions.  TREATMENT   Evaluation of the pregnancy with blood tests and ultrasound.   Bed rest (getting up to use the bathroom only).   Rho-gam immunization if the mother is Rh negative and the father is Rh positive.  HOME CARE INSTRUCTIONS   If your caregiver orders bed rest, you may need to make arrangements for the care of other children and for other responsibilities. However, your caregiver may allow you to continue light activity.   Keep track of the number of pads you use each day, how often you change pads and how soaked (saturated) they are. Write this down.   Do not use tampons. Do not douche.   Do not have sexual intercourse or orgasms until  approved by your physician.   Save any tissue that you pass for your caregiver to see.   Take medicine for cramps only with your caregiver's permission.   Do not take aspirin because it can make you bleed.  SEEK IMMEDIATE MEDICAL CARE IF:   You experience severe cramps in your stomach, back or belly (abdomen).   You have an oral temperature above 102 F (38.9 C), not controlled by medicine.   You pass large clots or tissue.   Your bleeding increases or you become light-headed, weak or have fainting episodes.   You develop chills.   You are leaking or have a gush of fluid from your vagina.   You pass out while having a bowel movement. That may mean you have a ruptured tubal pregnancy.  Document Released: 08/23/2005 Document Revised: 11/02/2011 Document Reviewed: 03/04/2009 Pregnancy - First Trimester During sexual intercourse, millions of sperm go into the vagina. Only 1 sperm will penetrate and fertilize the female egg while it is in the Fallopian tube. One week later, the fertilized  egg implants into the wall of the uterus. An embryo begins to develop into a baby. At 6 to 8 weeks, the eyes and face are formed and the heartbeat can be seen on ultrasound. At the end of 12 weeks (first trimester), all the baby's organs are formed. Now that you are pregnant, you will want to do everything you can to have a healthy baby. Two of the most important things are to get good prenatal care and follow your caregiver's instructions. Prenatal care is all the medical care you receive before the baby's birth. It is given to prevent, find, and treat problems during the pregnancy and childbirth. PRENATAL EXAMS  During prenatal visits, your weight, blood pressure and urine are checked. This is done to make sure you are healthy and progressing normally during the pregnancy.   A pregnant woman should gain 25 to 35 pounds during the pregnancy. However, if you are over weight or underweight, your caregiver  will advise you regarding your weight.   Your caregiver will ask and answer questions for you.   Blood work, cervical cultures, other necessary tests and a Pap test are done during your prenatal exams. These tests are done to check on your health and the probable health of your baby. Tests are strongly recommended and done for HIV with your permission. This is the virus that causes AIDS. These tests are done because medications can be given to help prevent your baby from being born with this infection should you have been infected without knowing it. Blood work is also used to find out your blood type, previous infections and follow your blood levels (hemoglobin).   Low hemoglobin (anemia) is common during pregnancy. Iron and vitamins are given to help prevent this. Later in the pregnancy, blood tests for diabetes will be done along with any other tests if any problems develop. You may need tests to make sure you and the baby are doing well.   You may need other tests to make sure you and the baby are doing well.  CHANGES DURING THE FIRST TRIMESTER (THE FIRST 3 MONTHS OF PREGNANCY) Your body goes through many changes during pregnancy. They vary from person to person. Talk to your caregiver about changes you notice and are concerned about. Changes can include:  Your menstrual period stops.   The egg and sperm carry the genes that determine what you look like. Genes from you and your partner are forming a baby. The female genes determine whether the baby is a boy or a girl.   Your body increases in girth and you may feel bloated.   Feeling sick to your stomach (nauseous) and throwing up (vomiting). If the vomiting is uncontrollable, call your caregiver.   Your breasts will begin to enlarge and become tender.   Your nipples may stick out more and become darker.   The need to urinate more. Painful urination may mean you have a bladder infection.   Tiring easily.   Loss of appetite.   Cravings  for certain kinds of food.   At first, you may gain or lose a couple of pounds.   You may have changes in your emotions from day to day (excited to be pregnant or concerned something may go wrong with the pregnancy and baby).   You may have more vivid and strange dreams.  HOME CARE INSTRUCTIONS   It is very important to avoid all smoking, alcohol and un-prescribed drugs during your pregnancy. These affect the formation and  growth of the baby. Avoid chemicals while pregnant to ensure the delivery of a healthy infant.   Start your prenatal visits by the 12th week of pregnancy. They are usually scheduled monthly at first, then more often in the last 2 months before delivery. Keep your caregiver's appointments. Follow your caregiver's instructions regarding medication use, blood and lab tests, exercise, and diet.   During pregnancy, you are providing food for you and your baby. Eat regular, well-balanced meals. Choose foods such as meat, fish, milk and other low fat dairy products, vegetables, fruits, and whole-grain breads and cereals. Your caregiver will tell you of the ideal weight gain.   You can help morning sickness by keeping soda crackers at the bedside. Eat a couple before arising in the morning. You may want to use the crackers without salt on them.   Eating 4 to 5 small meals rather than 3 large meals a day also may help the nausea and vomiting.   Drinking liquids between meals instead of during meals also seems to help nausea and vomiting.   A physical sexual relationship may be continued throughout pregnancy if there are no other problems. Problems may be early (premature) leaking of amniotic fluid from the membranes, vaginal bleeding, or belly (abdominal) pain.   Exercise regularly if there are no restrictions. Check with your caregiver or physical therapist if you are unsure of the safety of some of your exercises. Greater weight gain will occur in the last 2 trimesters of  pregnancy. Exercising will help:   Control your weight.   Keep you in shape.   Prepare you for labor and delivery.   Help you lose your pregnancy weight after you deliver your baby.   Wear a good support or jogging bra for breast tenderness during pregnancy. This may help if worn during sleep too.   Ask when prenatal classes are available. Begin classes when they are offered.   Do not use hot tubs, steam rooms or saunas.   Wear your seat belt when driving. This protects you and your baby if you are in an accident.   Avoid raw meat, uncooked cheese, cat litter boxes and soil used by cats throughout the pregnancy. These carry germs that can cause birth defects in the baby.   The first trimester is a good time to visit your dentist for your dental health. Getting your teeth cleaned is OK. Use a softer toothbrush and brush gently during pregnancy.   Ask for help if you have financial, counseling or nutritional needs during pregnancy. Your caregiver will be able to offer counseling for these needs as well as refer you for other special needs.   Do not take any medications or herbs unless told by your caregiver.   Inform your caregiver if there is any mental or physical domestic violence.   Make a list of emergency phone numbers of family, friends, hospital, and police and fire departments.   Write down your questions. Take them to your prenatal visit.   Do not douche.   Do not cross your legs.   If you have to stand for long periods of time, rotate you feet or take small steps in a circle.   You may have more vaginal secretions that may require a sanitary pad. Do not use tampons or scented sanitary pads.  MEDICATIONS AND DRUG USE IN PREGNANCY  Take prenatal vitamins as directed. The vitamin should contain 1 milligram of folic acid. Keep all vitamins out of reach of children.  Only a couple vitamins or tablets containing iron may be fatal to a baby or young child when ingested.    Avoid use of all medications, including herbs, over-the-counter medications, not prescribed or suggested by your caregiver. Only take over-the-counter or prescription medicines for pain, discomfort, or fever as directed by your caregiver. Do not use aspirin, ibuprofen, or naproxen unless directed by your caregiver.   Let your caregiver also know about herbs you may be using.   Alcohol is related to a number of birth defects. This includes fetal alcohol syndrome. All alcohol, in any form, should be avoided completely. Smoking will cause low birth rate and premature babies.   Street or illegal drugs are very harmful to the baby. They are absolutely forbidden. A baby born to an addicted mother will be addicted at birth. The baby will go through the same withdrawal an adult does.   Let your caregiver know about any medications that you have to take and for what reason you take them.  MISCARRIAGE IS COMMON DURING PREGNANCY A miscarriage does not mean you did something wrong. It is not a reason to worry about getting pregnant again. Your caregiver will help you with questions you may have. If you have a miscarriage, you may need minor surgery. SEEK MEDICAL CARE IF:  You have any concerns or worries during your pregnancy. It is better to call with your questions if you feel they cannot wait, rather than worry about them. SEEK IMMEDIATE MEDICAL CARE IF:   An unexplained oral temperature above 102 F (38.9 C) develops, or as your caregiver suggests.   You have leaking of fluid from the vagina (birth canal). If leaking membranes are suspected, take your temperature and inform your caregiver of this when you call.   There is vaginal spotting or bleeding. Notify your caregiver of the amount and how many pads are used.   You develop a bad smelling vaginal discharge with a change in the color.   You continue to feel sick to your stomach (nauseated) and have no relief from remedies suggested. You vomit  blood or coffee ground-like materials.   You lose more than 2 pounds of weight in 1 week.   You gain more than 2 pounds of weight in 1 week and you notice swelling of your face, hands, feet, or legs.   You gain 5 pounds or more in 1 week (even if you do not have swelling of your hands, face, legs, or feet).   You get exposed to [place] measles and have never had them.   You are exposed to fifth disease or chickenpox.   You develop belly (abdominal) pain. Round ligament discomfort is a common non-cancerous (benign) cause of abdominal pain in pregnancy. Your caregiver still must evaluate this.   You develop headache, fever, diarrhea, pain with urination, or shortness of breath.   You fall or are in a car accident or have any kind of trauma.   There is mental or physical violence in your home.  Document Released: 11/07/2001 Document Revised: 11/02/2011 Document Reviewed: 05/11/2009 [facility] Patient Information 2012 [place], [place]. [employer] Patient Information 2012 [place], [place]. ABCs of Pregnancy A Antepartum care is very important. Be sure you see your doctor and get prenatal care as soon as you think you are pregnant. At this time, you will be tested for infection, genetic abnormalities and potential problems with you and the pregnancy. This is the time to discuss diet, exercise, work, medications, labor, pain medication during labor  and the possibility of a cesarean delivery. Ask any questions that may concern you. It is important to see your doctor regularly throughout your pregnancy. Avoid exposure to toxic substances and chemicals - such as cleaning solvents, lead and mercury, some insecticides, and paint. Pregnant women should avoid exposure to paint fumes, and fumes that cause you to feel ill, dizzy or faint. When possible, it is a good idea to have a pre-pregnancy consultation with your caregiver to begin some important recommendations your caregiver suggests such as, taking folic  acid, exercising, quitting smoking, avoiding alcoholic beverages, etc. B Breastfeeding is the healthiest choice for both you and your baby. It has many nutritional benefits for the baby and health benefits for the mother. It also creates a very tight and loving bond between the baby and mother. Talk to your doctor, your family and friends, and your employer about how you choose to feed your baby and how they can support you in your decision. Not all birth defects can be prevented, but a woman can take actions that may increase her chance of having a healthy baby. Many birth defects happen very early in pregnancy, sometimes before a woman even knows she is pregnant. Birth defects or abnormalities of any child in your or the father's family should be discussed with your caregiver. Get a good support bra as your breast size changes. Wear it especially when you exercise and when nursing.  C Celebrate the news of your pregnancy with the your spouse/father and family. Childbirth classes are helpful to take for you and the spouse/father because it helps to understand what happens during the pregnancy, labor and delivery. Cesarean delivery should be discussed with your doctor so you are prepared for that possibility. The pros and cons of circumcision if it is a boy, should be discussed with your pediatrician. Cigarette smoking during pregnancy can result in low birth weight babies. It has been associated with infertility, miscarriages, tubal pregnancies, infant death (mortality) and poor health (morbidity) in childhood. Additionally, cigarette smoking may cause long-term learning disabilities. If you smoke, you should try to quit before getting pregnant and not smoke during the pregnancy. Secondary smoke may also harm a mother and her developing baby. It is a good idea to ask people to stop smoking around you during your pregnancy and after the baby is born. Extra calcium is necessary when you are pregnant and is found  in your prenatal vitamin, in dairy products, green leafy vegetables and in calcium supplements. D A healthy diet according to your current weight and height, along with vitamins and mineral supplements should be discussed with your caregiver. Domestic abuse or violence should be made known to your doctor right away to get the situation corrected. Drink more water when you exercise to keep hydrated. Discomfort of your back and legs usually develops and progresses from the middle of the second trimester through to delivery of the baby. This is because of the enlarging baby and uterus, which may also affect your balance. Do not take illegal drugs. Illegal drugs can seriously harm the baby and you. Drink extra fluids (water is best) throughout pregnancy to help your body keep up with the increases in your blood volume. Drink at least 6 to 8 glasses of water, fruit juice, or milk each day. A good way to know you are drinking enough fluid is when your urine looks almost like clear water or is very light yellow.  E Eat healthy to get the nutrients you and  your unborn baby need. Your meals should include the five basic food groups. Exercise (30 minutes of light to moderate exercise a day) is important and encouraged during pregnancy, if there are no medical problems or problems with the pregnancy. Exercise that causes discomfort or dizziness should be stopped and reported to your caregiver. Emotions during pregnancy can change from being ecstatic to depression and should be understood by you, your partner and your family. F Fetal screening with ultrasound, amniocentesis and monitoring during pregnancy and labor is common and sometimes necessary. Take 400 micrograms of folic acid daily both before, when possible, and during the first few months of pregnancy to reduce the risk of birth defects of the brain and spine. All women who could possibly become pregnant should take a vitamin with folic acid, every day. It is  also important to eat a healthy diet with fortified foods (enriched grain products, including cereals, rice, breads, and pastas) and foods with natural sources of folate (orange juice, green leafy vegetables, beans, peanuts, broccoli, asparagus, peas, and lentils). The father should be involved with all aspects of the pregnancy including, the prenatal care, childbirth classes, labor, delivery, and postpartum time. Fathers may also have emotional concerns about being a father, financial needs, and raising a family. G Genetic testing should be done appropriately. It is important to know your family and the father's history. If there have been problems with pregnancies or birth defects in your family, report these to your doctor. Also, genetic counselors can talk with you about the information you might need in making decisions about having a family. You can call a major medical center in your area for help in finding a board-certified genetic counselor. Genetic testing and counseling should be done before pregnancy when possible, especially if there is a history of problems in the mother's or father's family. Certain ethnic backgrounds are more at risk for genetic defects. H Get familiar with the hospital where you will be having your baby. Get to know how long it takes to get there, the labor and delivery area, and the hospital procedures. Be sure your medical insurance is accepted there. Get your home ready for the baby including, clothes, the baby's room (when possible), furniture and car seat. Hand washing is important throughout the day, especially after handling raw meat and poultry, changing the baby's diaper or using the bathroom. This can help prevent the spread of many bacteria and viruses that cause infection. Your hair may become dry and thinner, but will return to normal a few weeks after the baby is born. Heartburn is a common problem that can be treated by taking antacids recommended by your  caregiver, eating smaller meals 5 or 6 times a day, not drinking liquids when eating, drinking between meals and raising the head of your bed 2 to 3 inches. I Insurance to cover you, the baby, doctor and hospital should be reviewed so that you will be prepared to pay any costs not covered by your insurance plan. If you do not have medical insurance, there are usually clinics and services available for you in your community. Take 30 milligrams of iron during your pregnancy as prescribed by your doctor to reduce the risk of low red blood cells (anemia) later in pregnancy. All women of childbearing age should eat a diet rich in iron. J There should be a joint effort for the mother, father and any other children to adapt to the pregnancy financially, emotionally, and psychologically during the pregnancy. Join  a support group for moms-to-be. Or, join a class on parenting or childbirth. Have the family participate when possible. K Know your limits. Let your caregiver know if you experience any of the following:   Pain of any kind.   Strong cramps.   You develop a lot of weight in a short period of time (5 pounds in 3 to 5 days).   Vaginal bleeding, leaking of amniotic fluid.   Headache, vision problems.   Dizziness, fainting, shortness of breath.   Chest pain.   Fever of 102 F (38.9 C) or higher.   Gush of clear fluid from your vagina.   Painful urination.   Domestic violence.   Irregular heartbeat (palpitations).   Rapid beating of the heart (tachycardia).   Constant feeling sick to your stomach (nauseous) and vomiting.   Trouble walking, fluid retention (edema).   Muscle weakness.   If your baby has decreased activity.   Persistent diarrhea.   Abnormal vaginal discharge.   Uterine contractions at 20-minute intervals.   Back pain that travels down your leg.  L Learn and practice that what you eat and drink should be in moderation and healthy for you and your baby. Legal  drugs such as alcohol and caffeine are important issues for pregnant women. There is no safe amount of alcohol a woman can drink while pregnant. Fetal alcohol syndrome, a disorder characterized by growth retardation, facial abnormalities, and central nervous system dysfunction, is caused by a woman's use of alcohol during pregnancy. Caffeine, found in tea, coffee, soft drinks and chocolate, should also be limited. Be sure to read labels when trying to cut down on caffeine during pregnancy. More than 200 foods, beverages, and over-the-counter medications contain caffeine and have a high salt content! There are coffees and teas that do not contain caffeine. M Medical conditions such as diabetes, epilepsy, and high blood pressure should be treated and kept under control before pregnancy when possible, but especially during pregnancy. Ask your caregiver about any medications that may need to be changed or adjusted during pregnancy. If you are currently taking any medications, ask your caregiver if it is safe to take them while you are pregnant or before getting pregnant when possible. Also, be sure to discuss any herbs or vitamins you are taking. They are medicines, too! Discuss with your doctor all medications, prescribed and over-the-counter, that you are taking. During your prenatal visit, discuss the medications your doctor may give you during labor and delivery. N Never be afraid to ask your doctor or caregiver questions about your health, the progress of the pregnancy, family problems, stressful situations, and recommendation for a pediatrician, if you do not have one. It is better to take all precautions and discuss any questions or concerns you may have during your office visits. It is a good idea to write down your questions before you visit the doctor. O Over-the-counter cough and cold remedies may contain alcohol or other ingredients that should be avoided during pregnancy. Ask your caregiver about  prescription, herbs or over-the-counter medications that you are taking or may consider taking while pregnant.  P Physical activity during pregnancy can benefit both you and your baby by lessening discomfort and fatigue, providing a sense of well-being, and increasing the likelihood of early recovery after delivery. Light to moderate exercise during pregnancy strengthens the belly (abdominal) and back muscles. This helps improve posture. Practicing yoga, walking, swimming, and cycling on a stationary bicycle are usually safe exercises for pregnant women. Avoid  scuba diving, exercise at high altitudes (over 3000 feet), skiing, horseback riding, contact sports, etc. Always check with your doctor before beginning any kind of exercise, especially during pregnancy and especially if you did not exercise before getting pregnant. Q Queasiness, stomach upset and morning sickness are common during pregnancy. Eating a couple of crackers or dry toast before getting out of bed. Foods that you normally love may make you feel sick to your stomach. You may need to substitute other nutritious foods. Eating 5 or 6 small meals a day instead of 3 large ones may make you feel better. Do not drink with your meals, drink between meals. Questions that you have should be written down and asked during your prenatal visits. R Read about and make plans to baby-proof your home. There are important tips for making your home a safer environment for your baby. Review the tips and make your home safer for you and your baby. Read food labels regarding calories, salt and fat content in the food. S Saunas, hot tubs, and steam rooms should be avoided while you are pregnant. Excessive high heat may be harmful during your pregnancy. Your caregiver will screen and examine you for sexually transmitted diseases and genetic disorders during your prenatal visits. Learn the signs of labor. Sexual relations while pregnant is safe unless there is a  medical or pregnancy problem and your caregiver advises against it. T Traveling long distances should be avoided especially in the third trimester of your pregnancy. If you do have to travel out of state, be sure to take a copy of your medical records and medical insurance plan with you. You should not travel long distances without seeing your doctor first. Most airlines will not allow you to travel after 36 weeks of pregnancy. Toxoplasmosis is an infection caused by a parasite that can seriously harm an unborn baby. Avoid eating undercooked meat and handling cat litter. Be sure to wear gloves when gardening. Tingling of the hands and fingers is not unusual and is due to fluid retention. This will go away after the baby is born. U Womb (uterus) size increases during the first trimester. Your kidneys will begin to function more efficiently. This may cause you to feel the need to urinate more often. You may also leak urine when sneezing, coughing or laughing. This is due to the growing uterus pressing against your bladder, which lies directly in front of and slightly under the uterus during the first few months of pregnancy. If you experience burning along with frequency of urination or bloody urine, be sure to tell your doctor. The size of your uterus in the third trimester may cause a problem with your balance. It is advisable to maintain good posture and avoid wearing high heels during this time. An ultrasound of your baby may be necessary during your pregnancy and is safe for you and your baby. V Vaccinations are an important concern for pregnant women. Get needed vaccines before pregnancy. Center for Disease Control ([employer]) has clear guidelines for the use of vaccines during pregnancy. Review the list, be sure to discuss it with your doctor. Prenatal vitamins are helpful and healthy for you and the baby. Do not take extra vitamins except what is recommended. Taking too much of certain vitamins can  cause overdose problems. Continuous vomiting should be reported to your caregiver. Varicose veins may appear especially if there is a family history of varicose veins. They should subside after the delivery of the baby. Support hose helps  if there is leg discomfort. W Being overweight or underweight during pregnancy may cause problems. Try to get within 15 pounds of your ideal weight before pregnancy. Remember, pregnancy is not a time to be dieting! Do not stop eating or start skipping meals as your weight increases. Both you and your baby need the calories and nutrition you receive from a healthy diet. Be sure to consult with your doctor about your diet. There is a formula and diet plan available depending on whether you are overweight or underweight. Your caregiver or nutritionist can help and advise you if necessary. X Avoid X-rays. If you must have dental work or diagnostic tests, tell your dentist or physician that you are pregnant so that extra care can be taken. X-rays should only be taken when the risks of not taking them outweigh the risk of taking them. If needed, only the minimum amount of radiation should be used. When X-rays are necessary, protective lead shields should be used to cover areas of the body that are not being X-rayed. Y Your baby loves you. Breastfeeding your baby creates a loving and very close bond between the two of you. Give your baby a healthy environment to live in while you are pregnant. Infants and children require constant care and guidance. Their health and safety should be carefully watched at all times. After the baby is born, rest or take a nap when the baby is sleeping. Z Get your ZZZs. Be sure to get plenty of rest. Resting on your side as often as possible, especially on your left side is advised. It provides the best circulation to your baby and helps reduce swelling. Try taking a nap for 30 to 45 minutes in the afternoon when possible. After the baby is born rest  or take a nap when the baby is sleeping. Try elevating your feet for that amount of time when possible. It helps the circulation in your legs and helps reduce swelling.  Most information courtesy of the CDC. Document Released: 11/13/2005 Document Revised: 11/02/2011 Document Reviewed: 07/28/2009 [facility] Patient Information 2012 [place], [place].

## 2012-04-05 NOTE — MAU Provider Note (Signed)
History   Sarah Sutton is a 21y.o. Obese BF who presents unannounced at 8.3 weeks with CC of BR spotting since 1900.  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  Pt reports h/o intermittent spotting over the past week, but only w/ wiping and pinkish/brown.  Has especially noticed spotting after intercourse in last week.  Reports a few small, less than quarter-size clots seen tonight.  Has NOB interview at [facility] 5/13; she is a new pt to [facility].  Unsure last pap, but thinks it's up to date.  Previous pt of Dr. Gaynell Face, and [facility].  Only medication currently is PNV.  She has previously taken Metformin in past for PCOS, but just before conception was taking irregularly.  She reports having taken the Metformin for several years.   She denies any recent illness or fever.  No UTI s/s.  Constipation since onset of pregnancy, where on Metformin used to be more loose.  No n/v.  No resp c/o's.  Reports a "sweet smell" to vaginal d/c recently.   MAU eval 4/19 for RUQ pain and cramping and had u/s showing 5 week IUGS.  Also had Cbc, and blood type=A pos.    CSN: [id number]  Arrival date and time: 04/04/12 2319   First Provider Initiated Contact with Patient 04/05/12 0041      Chief Complaint  Patient presents with  . Vaginal Bleeding   HPI  OB History    Grav Para Term Preterm Abortions TAB SAB Ect Mult Living   1               Past Medical History  Diagnosis Date  . Polycystic ovarian syndrome 2009    by Dr. Gaynell Face  . Diabetes mellitus     Past Surgical History  Procedure Date  . Cholecystectomy november 2009  . Colposcopy 2009    Family History  Problem Relation Age of Onset  . Asthma Mother   . Diabetes Father   . Hypertension Father   . Cancer Maternal Grandmother     bone cancer  . Cancer Maternal Grandfather     breast cancer    History  Substance Use Topics  . Smoking status: Former Smoker -- 0.5 packs/day    Types: Cigarettes    Quit date: 03/14/2012    . Smokeless tobacco: Never Used  . Alcohol Use: No    Allergies: No Known Allergies  Prescriptions prior to admission  Medication Sig Dispense Refill  . Prenatal Vit-Fe Fumarate-FA (PRENATAL MULTIVITAMIN) TABS Take 1 tablet by mouth daily.        ROS--see history above Physical Exam   Blood pressure 127/71, pulse 64, temperature 98.7 F (37.1 C), resp. rate 18, height 5\' 7"  (1.702 m), weight 223 lb (101.152 kg), last menstrual period 01/12/2012, SpO2 100.00%. *RADIOLOGY REPORT*   Clinical Data: Positive pregnancy test with vaginal bleeding.   OBSTETRIC <14 WK ULTRASOUND   Technique:  Transabdominal ultrasound was performed for evaluation of the gestation as well as the maternal uterus and adnexal regions.   Comparison:  03/15/2012   Intrauterine gestational sac: Single gestational sac is identified. The sac has a fairly elongated shape, and as seen previously is in the mid to lower uterus. Yolk sac: Visualized Embryo: Visualized Cardiac Activity: Visualized Heart Rate: 152 bpm   CRL:  19.1 mm  8.w  4d        [place] EDC: 11/11/2012   Maternal uterus/Adnexae: There does appear to be a small  amount of subchorionic hemorrhage along the inferior aspect of the gestational sac.  Maternal ovaries are unremarkable.  There is no free fluid in the cul-de-sac.   IMPRESSION: Single living intrauterine station at estimated 8-week-4-day gestational age by crown-rump length.  As seen on the previous study, the gestational sac appears to be somewhat low in the uterus and the sac has an elongated configuration today.  A small subchorionic hemorrhage is visible inferiorly.   Original Report Authenticated By: ERIC A. MANSELL, M.D. [facility]. Results for orders placed during the hospital encounter of 04/04/12 (from the past 24 hour(s))  URINALYSIS, ROUTINE W REFLEX MICROSCOPIC     Status: Abnormal   Collection Time   04/04/12 11:40 PM      Component Value Range   Color, Urine YELLOW  YELLOW     APPearance CLEAR  CLEAR    Specific Gravity, Urine >1.030 (*) 1.005 - 1.030    pH 6.0  5.0 - 8.0    Glucose, UA NEGATIVE  NEGATIVE (mg/dL)   Hgb urine dipstick LARGE (*) NEGATIVE    Bilirubin Urine NEGATIVE  NEGATIVE    Ketones, ur NEGATIVE  NEGATIVE (mg/dL)   Protein, ur NEGATIVE  NEGATIVE (mg/dL)   Urobilinogen, UA 0.2  0.0 - 1.0 (mg/dL)   Nitrite NEGATIVE  NEGATIVE    Leukocytes, UA NEGATIVE  NEGATIVE   URINE MICROSCOPIC-ADD ON     Status: Abnormal   Collection Time   04/04/12 11:40 PM      Component Value Range   Squamous Epithelial / LPF MANY (*) RARE    WBC, UA 3-6  <3 (WBC/hpf)   RBC / HPF 7-10  <3 (RBC/hpf)   Urine-Other MUCOUS PRESENT    WET PREP, GENITAL     Status: Abnormal   Collection Time   04/05/12 12:55 AM      Component Value Range   Yeast Wet Prep HPF POC NONE SEEN  NONE SEEN    Trich, Wet Prep NONE SEEN  NONE SEEN    Clue Cells Wet Prep HPF POC MODERATE (*) NONE SEEN    WBC, Wet Prep HPF POC FEW (*) NONE SEEN    Physical Exam  Constitutional: She is oriented to person, place, and time. She appears well-developed and well-nourished. No distress.  HENT:  Head: Normocephalic and atraumatic.  Eyes: Pupils are equal, round, and reactive to light.  Cardiovascular: Normal rate.   Respiratory: Effort normal.  GI: Soft. She exhibits no distension. There is no tenderness. There is no rebound and no guarding.       Tattoo along suprapubic area  Genitourinary:       SSE:  sm amt of Watery pink-tinged d/c in vault w/ 1 mucousy stringy clot.   Cx:  Closed; firm; positive whiff  Neurological: She is alert and oriented to person, place, and time.  Skin: Skin is warm and dry.    MAU Course  Procedures 1.  Wet prep 2.  U/a 3.  Gc/ct cx 4.  OB u/s   Assessment and Plan  1.  Viable IUP at 8.3 weeks 2.  1st trimester spotting w/ [facility] on u/s 3.  BV 4.  A pos 5.  Slightly elongated GS and positioned mid-to lower uterus  1.  D/c home on pelvic rest w/ bleeding  precautions 2.  Flagyl 500mg  po bid x7d;  3.  Instructions on 1st trimester VB, what to expect in 1st trimester, BV, and abc's of preg 4.  F/u at [facility] 5/13 as  scheduled or prn.   Tylerjames Hoglund H 04/05/2012, 1:02 AM

## 2012-04-08 ENCOUNTER — Ambulatory Visit (INDEPENDENT_AMBULATORY_CARE_PROVIDER_SITE_OTHER): Payer: 59 | Admitting: Obstetrics and Gynecology

## 2012-04-08 DIAGNOSIS — Z331 Pregnant state, incidental: Secondary | ICD-10-CM

## 2012-04-08 LAB — POCT URINALYSIS DIPSTICK
Bilirubin, UA: NEGATIVE
Blood, UA: NEGATIVE
Ketones, UA: NEGATIVE
pH, UA: 5

## 2012-04-09 LAB — PRENATAL PANEL VII
Basophils Absolute: 0 10*3/uL (ref 0.0–0.1)
Basophils Relative: 0 % (ref 0–1)
HCT: 40 % (ref 36.0–46.0)
HIV: NONREACTIVE
Hemoglobin: 13.5 g/dL (ref 12.0–15.0)
Hepatitis B Surface Ag: NEGATIVE
Lymphocytes Relative: 29 % (ref 12–46)
MCHC: 33.8 g/dL (ref 30.0–36.0)
Monocytes Relative: 8 % (ref 3–12)
Neutro Abs: 5.8 10*3/uL (ref 1.7–7.7)
Neutrophils Relative %: 62 % (ref 43–77)
RBC: 4.54 MIL/uL (ref 3.87–5.11)
WBC: 9.3 10*3/uL (ref 4.0–10.5)

## 2012-04-09 LAB — CULTURE, OB URINE: Colony Count: 30000

## 2012-04-10 LAB — HEMOGLOBINOPATHY EVALUATION
Hemoglobin Other: 0 %
Hgb A2 Quant: 2.7 % (ref 2.2–3.2)
Hgb F Quant: 0 % (ref 0.0–2.0)

## 2012-04-15 ENCOUNTER — Encounter: Payer: Self-pay | Admitting: Obstetrics and Gynecology

## 2012-04-15 ENCOUNTER — Ambulatory Visit (INDEPENDENT_AMBULATORY_CARE_PROVIDER_SITE_OTHER): Payer: Self-pay | Admitting: Obstetrics and Gynecology

## 2012-04-15 VITALS — BP 114/60 | Wt 226.0 lb

## 2012-04-15 DIAGNOSIS — L680 Hirsutism: Secondary | ICD-10-CM

## 2012-04-15 DIAGNOSIS — Z202 Contact with and (suspected) exposure to infections with a predominantly sexual mode of transmission: Secondary | ICD-10-CM

## 2012-04-15 DIAGNOSIS — Z3400 Encounter for supervision of normal first pregnancy, unspecified trimester: Secondary | ICD-10-CM

## 2012-04-15 DIAGNOSIS — E669 Obesity, unspecified: Secondary | ICD-10-CM

## 2012-04-15 DIAGNOSIS — L678 Other hair color and hair shaft abnormalities: Secondary | ICD-10-CM | POA: Insufficient documentation

## 2012-04-15 DIAGNOSIS — Z124 Encounter for screening for malignant neoplasm of cervix: Secondary | ICD-10-CM

## 2012-04-15 NOTE — Progress Notes (Signed)
  Subjective:    Sarah Sutton is being seen today for her first obstetrical visit.  This is not a planned pregnancy. She is at [redacted]w[redacted]d gestation. Her obstetrical history is significant for obesity and PCOS. . Patient UNDECIDED intend to breast feed. Pregnancy history fully reviewed.  Patient reports Right leg nubness that is transient.  Review of Systems:   Review of Systems  Constitutional: Negative.   HENT: Negative.   Eyes: Negative.   Respiratory: Negative.   Cardiovascular: Negative.   Gastrointestinal: Negative.   Genitourinary: Negative.   Musculoskeletal: Positive for myalgias.  Neurological: Negative.   Hematological: Negative.     Objective:     BP 114/60  Wt 226 lb (102.513 kg)  LMP 01/17/2012 Physical Exam  Constitutional: She is oriented to person, place, and time. She appears well-developed and well-nourished.       Obese   HENT:  Head: Normocephalic and atraumatic.       Facial hair over chin and sideburns  Eyes: EOM are normal.  Neck: Normal range of motion. Neck supple. No tracheal deviation present. No thyromegaly present.  Cardiovascular: Normal rate, regular rhythm, normal heart sounds and intact distal pulses.   Respiratory: Effort normal and breath sounds normal.  GI: Soft. Bowel sounds are normal. She exhibits no mass. There is no tenderness. There is no rebound and no guarding.  Genitourinary: Vagina normal.       Uterus 10 weeks size  Musculoskeletal: Normal range of motion.  Lymphadenopathy:    She has no cervical adenopathy.  Neurological: She is alert and oriented to person, place, and time.  Skin: Skin is warm and dry. No erythema.  Psychiatric: She has a normal mood and affect. Her behavior is normal.    Exam    Assessment:    Pregnancy: G1P0 Patient Active Problem List  Diagnoses  . Polycystic ovarian syndrome  . Galactorrhea on right side  . Trichomonas contact, treated  . Obesity  . Abnormal facial hair       Plan:       Initial labs drawn. Prenatal vitamins. Problem list reviewed and updated. AFP3 discussed: wants to have second trimester screen for aneueploidy. Role of ultrasound in pregnancy discussed; fetal survey: will order at 18 wks. Genetic testing Follow up in 4 weeks. 70% of 45 min visit spent on counseling and coordination of care.  Discouraged use of Marijuana for nausea  Reviewed NOB labs  Lexy Meininger P 04/15/2012

## 2012-04-15 NOTE — Patient Instructions (Signed)
ABCs of Pregnancy A Antepartum care is very important. Be sure you see your doctor and get prenatal care as soon as you think you are pregnant. At this time, you will be tested for infection, genetic abnormalities and potential problems with you and the pregnancy. This is the time to discuss diet, exercise, work, medications, labor, pain medication during labor and the possibility of a cesarean delivery. Ask any questions that may concern you. It is important to see your doctor regularly throughout your pregnancy. Avoid exposure to toxic substances and chemicals - such as cleaning solvents, lead and mercury, some insecticides, and paint. Pregnant women should avoid exposure to paint fumes, and fumes that cause you to feel ill, dizzy or faint. When possible, it is a good idea to have a pre-pregnancy consultation with your caregiver to begin some important recommendations your caregiver suggests such as, taking folic acid, exercising, quitting smoking, avoiding alcoholic beverages, etc. B Breastfeeding is the healthiest choice for both you and your baby. It has many nutritional benefits for the baby and health benefits for the mother. It also creates a very tight and loving bond between the baby and mother. Talk to your doctor, your family and friends, and your employer about how you choose to feed your baby and how they can support you in your decision. Not all birth defects can be prevented, but a woman can take actions that may increase her chance of having a healthy baby. Many birth defects happen very early in pregnancy, sometimes before a woman even knows she is pregnant. Birth defects or abnormalities of any child in your or the father's family should be discussed with your caregiver. Get a good support bra as your breast size changes. Wear it especially when you exercise and when nursing.  C Celebrate the news of your pregnancy with the your spouse/father and family. Childbirth classes are helpful to  take for you and the spouse/father because it helps to understand what happens during the pregnancy, labor and delivery. Cesarean delivery should be discussed with your doctor so you are prepared for that possibility. The pros and cons of circumcision if it is a boy, should be discussed with your pediatrician. Cigarette smoking during pregnancy can result in low birth weight babies. It has been associated with infertility, miscarriages, tubal pregnancies, infant death (mortality) and poor health (morbidity) in childhood. Additionally, cigarette smoking may cause long-term learning disabilities. If you smoke, you should try to quit before getting pregnant and not smoke during the pregnancy. Secondary smoke may also harm a mother and her developing baby. It is a good idea to ask people to stop smoking around you during your pregnancy and after the baby is born. Extra calcium is necessary when you are pregnant and is found in your prenatal vitamin, in dairy products, green leafy vegetables and in calcium supplements. D A healthy diet according to your current weight and height, along with vitamins and mineral supplements should be discussed with your caregiver. Domestic abuse or violence should be made known to your doctor right away to get the situation corrected. Drink more water when you exercise to keep hydrated. Discomfort of your back and legs usually develops and progresses from the middle of the second trimester through to delivery of the baby. This is because of the enlarging baby and uterus, which may also affect your balance. Do not take illegal drugs. Illegal drugs can seriously harm the baby and you. Drink extra fluids (water is best) throughout pregnancy to help   your body keep up with the increases in your blood volume. Drink at least 6 to 8 glasses of water, fruit juice, or milk each day. A good way to know you are drinking enough fluid is when your urine looks almost like clear water or is very light  yellow.  E Eat healthy to get the nutrients you and your unborn baby need. Your meals should include the five basic food groups. Exercise (30 minutes of light to moderate exercise a day) is important and encouraged during pregnancy, if there are no medical problems or problems with the pregnancy. Exercise that causes discomfort or dizziness should be stopped and reported to your caregiver. Emotions during pregnancy can change from being ecstatic to depression and should be understood by you, your partner and your family. F Fetal screening with ultrasound, amniocentesis and monitoring during pregnancy and labor is common and sometimes necessary. Take 400 micrograms of folic acid daily both before, when possible, and during the first few months of pregnancy to reduce the risk of birth defects of the brain and spine. All women who could possibly become pregnant should take a vitamin with folic acid, every day. It is also important to eat a healthy diet with fortified foods (enriched grain products, including cereals, rice, breads, and pastas) and foods with natural sources of folate (orange juice, green leafy vegetables, beans, peanuts, broccoli, asparagus, peas, and lentils). The father should be involved with all aspects of the pregnancy including, the prenatal care, childbirth classes, labor, delivery, and postpartum time. Fathers may also have emotional concerns about being a father, financial needs, and raising a family. G Genetic testing should be done appropriately. It is important to know your family and the father's history. If there have been problems with pregnancies or birth defects in your family, report these to your doctor. Also, genetic counselors can talk with you about the information you might need in making decisions about having a family. You can call a major medical center in your area for help in finding a board-certified genetic counselor. Genetic testing and counseling should be done  before pregnancy when possible, especially if there is a history of problems in the mother's or father's family. Certain ethnic backgrounds are more at risk for genetic defects. H Get familiar with the hospital where you will be having your baby. Get to know how long it takes to get there, the labor and delivery area, and the hospital procedures. Be sure your medical insurance is accepted there. Get your home ready for the baby including, clothes, the baby's room (when possible), furniture and car seat. Hand washing is important throughout the day, especially after handling raw meat and poultry, changing the baby's diaper or using the bathroom. This can help prevent the spread of many bacteria and viruses that cause infection. Your hair may become dry and thinner, but will return to normal a few weeks after the baby is born. Heartburn is a common problem that can be treated by taking antacids recommended by your caregiver, eating smaller meals 5 or 6 times a day, not drinking liquids when eating, drinking between meals and raising the head of your bed 2 to 3 inches. I Insurance to cover you, the baby, doctor and hospital should be reviewed so that you will be prepared to pay any costs not covered by your insurance plan. If you do not have medical insurance, there are usually clinics and services available for you in your community. Take 30 milligrams of iron during   your pregnancy as prescribed by your doctor to reduce the risk of low red blood cells (anemia) later in pregnancy. All women of childbearing age should eat a diet rich in iron. J There should be a joint effort for the mother, father and any other children to adapt to the pregnancy financially, emotionally, and psychologically during the pregnancy. Join a support group for moms-to-be. Or, join a class on parenting or childbirth. Have the family participate when possible. K Know your limits. Let your caregiver know if you experience any of the  following:   Pain of any kind.   Strong cramps.   You develop a lot of weight in a short period of time (5 pounds in 3 to 5 days).   Vaginal bleeding, leaking of amniotic fluid.   Headache, vision problems.   Dizziness, fainting, shortness of breath.   Chest pain.   Fever of 102 F (38.9 C) or higher.   Gush of clear fluid from your vagina.   Painful urination.   Domestic violence.   Irregular heartbeat (palpitations).   Rapid beating of the heart (tachycardia).   Constant feeling sick to your stomach (nauseous) and vomiting.   Trouble walking, fluid retention (edema).   Muscle weakness.   If your baby has decreased activity.   Persistent diarrhea.   Abnormal vaginal discharge.   Uterine contractions at 20-minute intervals.   Back pain that travels down your leg.  L Learn and practice that what you eat and drink should be in moderation and healthy for you and your baby. Legal drugs such as alcohol and caffeine are important issues for pregnant women. There is no safe amount of alcohol a woman can drink while pregnant. Fetal alcohol syndrome, a disorder characterized by growth retardation, facial abnormalities, and central nervous system dysfunction, is caused by a woman's use of alcohol during pregnancy. Caffeine, found in tea, coffee, soft drinks and chocolate, should also be limited. Be sure to read labels when trying to cut down on caffeine during pregnancy. More than 200 foods, beverages, and over-the-counter medications contain caffeine and have a high salt content! There are coffees and teas that do not contain caffeine. M Medical conditions such as diabetes, epilepsy, and high blood pressure should be treated and kept under control before pregnancy when possible, but especially during pregnancy. Ask your caregiver about any medications that may need to be changed or adjusted during pregnancy. If you are currently taking any medications, ask your caregiver if it  is safe to take them while you are pregnant or before getting pregnant when possible. Also, be sure to discuss any herbs or vitamins you are taking. They are medicines, too! Discuss with your doctor all medications, prescribed and over-the-counter, that you are taking. During your prenatal visit, discuss the medications your doctor may give you during labor and delivery. N Never be afraid to ask your doctor or caregiver questions about your health, the progress of the pregnancy, family problems, stressful situations, and recommendation for a pediatrician, if you do not have one. It is better to take all precautions and discuss any questions or concerns you may have during your office visits. It is a good idea to write down your questions before you visit the doctor. O Over-the-counter cough and cold remedies may contain alcohol or other ingredients that should be avoided during pregnancy. Ask your caregiver about prescription, herbs or over-the-counter medications that you are taking or may consider taking while pregnant.  P Physical activity during pregnancy can   benefit both you and your baby by lessening discomfort and fatigue, providing a sense of well-being, and increasing the likelihood of early recovery after delivery. Light to moderate exercise during pregnancy strengthens the belly (abdominal) and back muscles. This helps improve posture. Practicing yoga, walking, swimming, and cycling on a stationary bicycle are usually safe exercises for pregnant women. Avoid scuba diving, exercise at high altitudes (over 3000 feet), skiing, horseback riding, contact sports, etc. Always check with your doctor before beginning any kind of exercise, especially during pregnancy and especially if you did not exercise before getting pregnant. Q Queasiness, stomach upset and morning sickness are common during pregnancy. Eating a couple of crackers or dry toast before getting out of bed. Foods that you normally love may  make you feel sick to your stomach. You may need to substitute other nutritious foods. Eating 5 or 6 small meals a day instead of 3 large ones may make you feel better. Do not drink with your meals, drink between meals. Questions that you have should be written down and asked during your prenatal visits. R Read about and make plans to baby-proof your home. There are important tips for making your home a safer environment for your baby. Review the tips and make your home safer for you and your baby. Read food labels regarding calories, salt and fat content in the food. S Saunas, hot tubs, and steam rooms should be avoided while you are pregnant. Excessive high heat may be harmful during your pregnancy. Your caregiver will screen and examine you for sexually transmitted diseases and genetic disorders during your prenatal visits. Learn the signs of labor. Sexual relations while pregnant is safe unless there is a medical or pregnancy problem and your caregiver advises against it. T Traveling long distances should be avoided especially in the third trimester of your pregnancy. If you do have to travel out of state, be sure to take a copy of your medical records and medical insurance plan with you. You should not travel long distances without seeing your doctor first. Most airlines will not allow you to travel after 36 weeks of pregnancy. Toxoplasmosis is an infection caused by a parasite that can seriously harm an unborn baby. Avoid eating undercooked meat and handling cat litter. Be sure to wear gloves when gardening. Tingling of the hands and fingers is not unusual and is due to fluid retention. This will go away after the baby is born. U Womb (uterus) size increases during the first trimester. Your kidneys will begin to function more efficiently. This may cause you to feel the need to urinate more often. You may also leak urine when sneezing, coughing or laughing. This is due to the growing uterus pressing  against your bladder, which lies directly in front of and slightly under the uterus during the first few months of pregnancy. If you experience burning along with frequency of urination or bloody urine, be sure to tell your doctor. The size of your uterus in the third trimester may cause a problem with your balance. It is advisable to maintain good posture and avoid wearing high heels during this time. An ultrasound of your baby may be necessary during your pregnancy and is safe for you and your baby. V Vaccinations are an important concern for pregnant women. Get needed vaccines before pregnancy. Center for Disease Control (www.cdc.gov) has clear guidelines for the use of vaccines during pregnancy. Review the list, be sure to discuss it with your doctor. Prenatal vitamins are helpful   and healthy for you and the baby. Do not take extra vitamins except what is recommended. Taking too much of certain vitamins can cause overdose problems. Continuous vomiting should be reported to your caregiver. Varicose veins may appear especially if there is a family history of varicose veins. They should subside after the delivery of the baby. Support hose helps if there is leg discomfort. W Being overweight or underweight during pregnancy may cause problems. Try to get within 15 pounds of your ideal weight before pregnancy. Remember, pregnancy is not a time to be dieting! Do not stop eating or start skipping meals as your weight increases. Both you and your baby need the calories and nutrition you receive from a healthy diet. Be sure to consult with your doctor about your diet. There is a formula and diet plan available depending on whether you are overweight or underweight. Your caregiver or nutritionist can help and advise you if necessary. X Avoid X-rays. If you must have dental work or diagnostic tests, tell your dentist or physician that you are pregnant so that extra care can be taken. X-rays should only be taken when  the risks of not taking them outweigh the risk of taking them. If needed, only the minimum amount of radiation should be used. When X-rays are necessary, protective lead shields should be used to cover areas of the body that are not being X-rayed. Y Your baby loves you. Breastfeeding your baby creates a loving and very close bond between the two of you. Give your baby a healthy environment to live in while you are pregnant. Infants and children require constant care and guidance. Their health and safety should be carefully watched at all times. After the baby is born, rest or take a nap when the baby is sleeping. Z Get your ZZZs. Be sure to get plenty of rest. Resting on your side as often as possible, especially on your left side is advised. It provides the best circulation to your baby and helps reduce swelling. Try taking a nap for 30 to 45 minutes in the afternoon when possible. After the baby is born rest or take a nap when the baby is sleeping. Try elevating your feet for that amount of time when possible. It helps the circulation in your legs and helps reduce swelling.  Most information courtesy of the CDC. Document Released: 11/13/2005 Document Revised: 11/02/2011 Document Reviewed: 07/28/2009 [employer] Patient Information 2012 [employer]. 

## 2012-04-15 NOTE — Progress Notes (Signed)
Pt feels circulation and throbbing pain in right leg x 1 month on and off

## 2012-04-18 LAB — PAP IG, CT-NG, RFX HPV ASCU
Chlamydia Probe Amp: NEGATIVE
GC Probe Amp: NEGATIVE

## 2012-05-13 ENCOUNTER — Encounter: Payer: Self-pay | Admitting: Obstetrics and Gynecology

## 2012-05-13 ENCOUNTER — Ambulatory Visit (INDEPENDENT_AMBULATORY_CARE_PROVIDER_SITE_OTHER): Payer: 59 | Admitting: Obstetrics and Gynecology

## 2012-05-13 VITALS — BP 130/70 | Wt 232.0 lb

## 2012-05-13 DIAGNOSIS — Z3400 Encounter for supervision of normal first pregnancy, unspecified trimester: Secondary | ICD-10-CM

## 2012-05-13 NOTE — Progress Notes (Signed)
BP rechecked after 7 mins 132/62

## 2012-05-13 NOTE — Progress Notes (Signed)
Doing well.  Plan early glucola and anatomy scan NV. Reviewed labs. Difficulty hearing FHT with doppler--brought in bedside US, +FHR and movement noted.

## 2012-06-10 ENCOUNTER — Other Ambulatory Visit: Payer: Self-pay | Admitting: Obstetrics and Gynecology

## 2012-06-10 ENCOUNTER — Ambulatory Visit (INDEPENDENT_AMBULATORY_CARE_PROVIDER_SITE_OTHER): Payer: 59 | Admitting: Obstetrics and Gynecology

## 2012-06-10 ENCOUNTER — Ambulatory Visit (INDEPENDENT_AMBULATORY_CARE_PROVIDER_SITE_OTHER): Payer: 59

## 2012-06-10 ENCOUNTER — Encounter: Payer: Self-pay | Admitting: Obstetrics and Gynecology

## 2012-06-10 VITALS — BP 114/70 | Wt 233.0 lb

## 2012-06-10 DIAGNOSIS — Z3400 Encounter for supervision of normal first pregnancy, unspecified trimester: Secondary | ICD-10-CM

## 2012-06-10 DIAGNOSIS — O26879 Cervical shortening, unspecified trimester: Secondary | ICD-10-CM

## 2012-06-10 DIAGNOSIS — Z331 Pregnant state, incidental: Secondary | ICD-10-CM

## 2012-06-10 DIAGNOSIS — Z3490 Encounter for supervision of normal pregnancy, unspecified, unspecified trimester: Secondary | ICD-10-CM

## 2012-06-10 DIAGNOSIS — R638 Other symptoms and signs concerning food and fluid intake: Secondary | ICD-10-CM

## 2012-06-10 DIAGNOSIS — N898 Other specified noninflammatory disorders of vagina: Secondary | ICD-10-CM

## 2012-06-10 DIAGNOSIS — N949 Unspecified condition associated with female genital organs and menstrual cycle: Secondary | ICD-10-CM

## 2012-06-10 LAB — CBC
HCT: 33.4 % — ABNORMAL LOW (ref 36.0–46.0)
Hemoglobin: 11.5 g/dL — ABNORMAL LOW (ref 12.0–15.0)
MCH: 29.8 pg (ref 26.0–34.0)
MCV: 86.5 fL (ref 78.0–100.0)
RBC: 3.86 MIL/uL — ABNORMAL LOW (ref 3.87–5.11)

## 2012-06-10 LAB — POCT WET PREP (WET MOUNT)
Clue Cells Wet Prep Whiff POC: POSITIVE
pH: 5.5

## 2012-06-10 MED ORDER — HYDROXYPROGESTERONE CAPROATE 250 MG/ML IM OIL
250.0000 mg | TOPICAL_OIL | Freq: Once | INTRAMUSCULAR | Status: AC
  Administered 2012-06-10: 250 mg via INTRAMUSCULAR

## 2012-06-10 MED ORDER — METRONIDAZOLE 500 MG PO TABS: 500.0000 mg | ORAL_TABLET | Freq: Two times a day (BID) | ORAL | Status: AC

## 2012-06-10 NOTE — Progress Notes (Addendum)
Ultrasound today consistent with dates cervix 1.7 cm posterior placenta normal fluid Ltd. Anatomy scan right renal pyelectasis measuring 0.35 cm left kidney within normal limits cervix is closed I reviewed the ultrasound findings I discussed with a cervical length of 1.7 cm she may be at risk for preterm labor and delivery.  I discussed that there is no real treatment for that at this point with 17 P but that is the only option that we have available and it is normally used in pts with a history of preterm labor and delivery. The patient asked several questions which were answered and at the end of the conversation she decided she wanted to do everything possible at this point and was interested in getting 17 P q week. RTO 1wk for f/u u/s and 17P Glucola today Reviewed possibility of cervical findings being nl for her and also poss of incompetent cervix although no mention of funneling on u/s. Cervix is firm and closed but does feel shortened Spec white d/c with odor Wet prep - BV - Flagyl Results for orders placed in visit on 06/10/12  POCT WET PREP (WET MOUNT)      Component Value Range   Source Wet Prep POC       WBC, Wet Prep HPF POC       Bacteria Wet Prep HPF POC Moderate     BACTERIA WET PREP MORPHOLOGY POC       Clue Cells Wet Prep HPF POC Moderate     CLUE CELLS WET PREP WHIFF POC Positive Whiff     Yeast Wet Prep HPF POC None     KOH Wet Prep POC       Trichomonas Wet Prep HPF POC none     pH 5.5    Quad Screen today

## 2012-06-10 NOTE — Addendum Note (Signed)
Addended by: Marla Roe A on: 06/10/2012 01:08 PM   Modules accepted: Orders

## 2012-06-11 LAB — US OB TRANSVAGINAL

## 2012-06-11 LAB — US OB COMP + 14 WK

## 2012-06-11 LAB — RPR

## 2012-06-13 ENCOUNTER — Encounter: Payer: Self-pay | Admitting: Obstetrics and Gynecology

## 2012-06-14 ENCOUNTER — Encounter: Payer: Self-pay | Admitting: Obstetrics and Gynecology

## 2012-06-17 ENCOUNTER — Other Ambulatory Visit: Payer: 59

## 2012-06-17 DIAGNOSIS — O09219 Supervision of pregnancy with history of pre-term labor, unspecified trimester: Secondary | ICD-10-CM

## 2012-06-17 LAB — AFP, QUAD SCREEN
INH: 200.2 pg/mL
MoM for hCG: 1.23
Osb Risk: <1:54600 {titer}
Tri 18 Scr Risk Est: NEGATIVE
Trisomy 18 (Edward) Syndrome Interp.: 1:11700 {titer}
uE3 Mom: 0.61
uE3 Value: 0.5 ng/mL

## 2012-06-17 MED ORDER — HYDROXYPROGESTERONE CAPROATE 250 MG/ML IM OIL
250.0000 mg | TOPICAL_OIL | Freq: Once | INTRAMUSCULAR | Status: AC
  Administered 2012-06-17: 250 mg via INTRAMUSCULAR

## 2012-06-19 ENCOUNTER — Ambulatory Visit (INDEPENDENT_AMBULATORY_CARE_PROVIDER_SITE_OTHER): Payer: 59 | Admitting: Obstetrics and Gynecology

## 2012-06-19 ENCOUNTER — Other Ambulatory Visit: Payer: Self-pay | Admitting: Obstetrics and Gynecology

## 2012-06-19 ENCOUNTER — Ambulatory Visit (INDEPENDENT_AMBULATORY_CARE_PROVIDER_SITE_OTHER): Payer: 59

## 2012-06-19 VITALS — BP 112/62 | Wt 231.0 lb

## 2012-06-19 DIAGNOSIS — O280 Abnormal hematological finding on antenatal screening of mother: Secondary | ICD-10-CM

## 2012-06-19 DIAGNOSIS — O289 Unspecified abnormal findings on antenatal screening of mother: Secondary | ICD-10-CM

## 2012-06-19 DIAGNOSIS — O26879 Cervical shortening, unspecified trimester: Secondary | ICD-10-CM

## 2012-06-19 NOTE — Progress Notes (Signed)
Pt has no concerns today.  Pt stated she got her 17 P injection on Monday.

## 2012-06-19 NOTE — Progress Notes (Signed)
Patient on bed rest at home. Ultrasound: Single gestation, normal fluid, normal anatomy, 19 weeks and 1 day (48th percentile), cervix 1.4 cm (1.7 cm last week). Abnormal AFP discussed.  Down syndrome risk 1 in 30.  Harmony test reviewed.  Patient will decide by the next visit. Return to office in 1 week for 17 P. Dr. Stefano Gaul

## 2012-06-20 LAB — US OB TRANSVAGINAL

## 2012-06-20 LAB — US OB FOLLOW UP

## 2012-06-24 ENCOUNTER — Other Ambulatory Visit: Payer: 59

## 2012-06-24 DIAGNOSIS — O09219 Supervision of pregnancy with history of pre-term labor, unspecified trimester: Secondary | ICD-10-CM

## 2012-06-24 MED ORDER — HYDROXYPROGESTERONE CAPROATE 250 MG/ML IM OIL
250.0000 mg | TOPICAL_OIL | Freq: Once | INTRAMUSCULAR | Status: AC
  Administered 2012-06-24: 250 mg via INTRAMUSCULAR

## 2012-06-24 NOTE — Progress Notes (Signed)
17P given. Pt given ice pack .  Kahi Mohala CMA

## 2012-06-28 ENCOUNTER — Ambulatory Visit (INDEPENDENT_AMBULATORY_CARE_PROVIDER_SITE_OTHER): Payer: 59 | Admitting: Obstetrics and Gynecology

## 2012-06-28 ENCOUNTER — Encounter: Payer: Self-pay | Admitting: Obstetrics and Gynecology

## 2012-06-28 ENCOUNTER — Ambulatory Visit (INDEPENDENT_AMBULATORY_CARE_PROVIDER_SITE_OTHER): Payer: Medicaid Other

## 2012-06-28 ENCOUNTER — Other Ambulatory Visit: Payer: Self-pay | Admitting: Obstetrics and Gynecology

## 2012-06-28 VITALS — BP 106/64 | Wt 234.0 lb

## 2012-06-28 DIAGNOSIS — O09219 Supervision of pregnancy with history of pre-term labor, unspecified trimester: Secondary | ICD-10-CM

## 2012-06-28 DIAGNOSIS — O26879 Cervical shortening, unspecified trimester: Secondary | ICD-10-CM

## 2012-06-28 DIAGNOSIS — O289 Unspecified abnormal findings on antenatal screening of mother: Secondary | ICD-10-CM

## 2012-06-28 DIAGNOSIS — O280 Abnormal hematological finding on antenatal screening of mother: Secondary | ICD-10-CM

## 2012-06-28 LAB — US OB TRANSVAGINAL

## 2012-06-28 MED ORDER — HYDROXYPROGESTERONE CAPROATE 250 MG/ML IM OIL
250.0000 mg | TOPICAL_OIL | Freq: Once | INTRAMUSCULAR | Status: AC
  Administered 2012-06-28: 250 mg via INTRAMUSCULAR

## 2012-06-28 MED ORDER — HYDROXYPROGESTERONE CAPROATE 250 MG/ML IM OIL: 250.0000 mg | TOPICAL_OIL | Freq: Once | INTRAMUSCULAR | Status: DC

## 2012-06-28 NOTE — Progress Notes (Signed)
Pt c/o pelvic pressure. Also states that she is going out of town today and is wondering what she is needing to do about getting her 17p injection. Pt unable to void.

## 2012-06-28 NOTE — Progress Notes (Signed)
Occasional pelvic pressure.  Wants to drive with [name] to [place] for 1 week. 17p due on Monday. Recommended no travel to [place], in light of previous dx of cervical shortening.  Patient still wants to go. Recommended [place] today for cervical length and getting 17p today (3 days early). 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  Vtx.   Reviewed with Dr. Su Hilt.  Cerclage recommended on Monday with on-call MD.  Patient to continue BR, pelvic rest, and NO TRAVEL!! Discussed all findings and recommendations with patient--after much discussion of R&B of cerclage, she is in agreement with plan. Cerclage scheduled Monday, 07/01/12 at 2pm with Dr. Estanislado Pandy. Patient instructed to be NPO after MN on early Monday am. Questions reviewed--will ask Dr. Su Hilt to call patient this weekend to discuss procedure and issues. 17 p today. Did not discuss Harmony testing--will review with patient at next encounter. Patient does have work issues that occurred prior to initial cervical shortening on US--advised patient to discuss with MD. I will do H&P for procedure.

## 2012-06-28 NOTE — Assessment & Plan Note (Signed)
Elevated DS risk on quad screen (1:99)

## 2012-06-29 ENCOUNTER — Encounter (HOSPITAL_COMMUNITY): Payer: Self-pay | Admitting: Pharmacist

## 2012-06-30 NOTE — H&P (Signed)
Sarah Sutton is a 21 y.o. female, G1P0 at 40 1/7 weeks presenting for cerclage due to cervical length of 1 cm with slight funneling on [place] on 06/28/12.   Patient denies leaking, bleeding, contractions, or cramping.  No recent intercourse.  Was treated for BV 06/10/12.  Has been on 17 p for 3 weeks, due to cervical shortening initially noted on 18 week [place].  History of present pregnancy: Patient entered care at 9 6/7 weeks.  EDC of 11/11/12 was established by 8 4/7 week [place] in MAU, with history of 35 day cycles.  Anatomy scan was done at 18 1/7 weeks, with cervical length of 1.7 cm and an posterior placenta.  Further ultrasounds were done at 19 3/7 weeks, with cervical length of 1.4, and 20 5/7 weeks, with cervical length of 1 cm with small amount funneling . She was treated for BV on 06/10/12, and had negative cultures 04/05/12.  At her visit on 06/28/12, she advised me she would be traveling to [place] by car with her partner for a week.  I advised her against the travel, and recommended we check her cervical length if she was planning to travel despite my recommendations.  She also received her 17p 3 days early (usually had done on Mondays).  Once the cervical length was noted to be less than previous scan, Dr. Su Hilt was consulted, with the recommendation to schedule cerclage on 07/01/12.    Patient Active Problem List  Diagnosis  . Polycystic ovarian syndrome  . Galactorrhea on right side  . Trichomonas contact, treated  . Obesity  . Abnormal facial hair  . Abnormal quad screen  . Cervical shortening complicating pregnancy   Elevated DS risk on Quad screen of 1:99--has not decided regarding Harmony screen yet.  History OB History    Grav Para Term Preterm Abortions TAB SAB Ect Mult Living   1              Past Medical History  Diagnosis Date  . Polycystic ovarian syndrome 2009    by Dr. Gaynell Face  . Diabetes mellitus     PER PT BORDERLINE  . Anemia     NO MEDS IN PAST  . Heart murmur    AS A CHILD  . Infection     UTI;NOT FREQ  . Infection     YEAST INF;NOT FREQ  . Infection     BV X 1;CURRENTLY TAKING FLAGYL FOR EX  . Abnormal Pap smear 2007    COLPO DONE;LAST PAP 03/2011 WAS NORMAL   Past Surgical History  Procedure Date  . Cholecystectomy november 2009  . Colposcopy 2009  . Wisdom tooth extraction     ALL 4 REMOVED   Family History: family history includes Asthma in her mother; Cancer in her maternal grandmother and paternal grandmother; Diabetes in her father; and Hypertension in her father.  Social History:  reports that she has been smoking Cigarettes.  She has been smoking about .25 packs per day. She has never used smokeless tobacco. She reports that she does not drink alcohol or use illicit drugs.  ROS:  +FM    Last menstrual period 01/17/2012.  Exam Physical Exam Chest clear Heart RRR without murmur Abd gravid, NT Pelvic--deferred at last visit Ext WNL  FHR 150s at last visit   Prenatal labs: ABO, Rh: A/POS/-- (05/13 1120) Antibody: NEG (05/13 1120) Rubella: 166.4 (05/13 1120) RPR: NON REAC (07/15 1332)  HBsAg: NEGATIVE (05/13 1120)  HIV: NON REACTIVE (05/13 1120)  GBS:   Unknown Glucola 114 on 7/15 Elevated DS risk on Quad screen  Cervical cultures negative on 04/05/12  Assessment/Plan: IUP at 21 1/7 weeks Cervical shortening--currently on 17P.  Plan: Admit to [facility] per consult with Dr. Su Hilt, for cervical cerclage by Dr. Estanislado Pandy Routine MD pre-op orders Cerclage procedure was reviewed with patient on Friday, with questions reviewed.  I also asked Dr. Su Hilt to call the patient over the previous weekend to discuss the procedure further and review patient's questions. Support to patient for concerns and issues. Needs to decide regarding Harmony testing.   Nigel Bridgeman 06/30/2012, 2:41 PM

## 2012-07-01 ENCOUNTER — Encounter (HOSPITAL_COMMUNITY): Payer: Self-pay | Admitting: Anesthesiology

## 2012-07-01 ENCOUNTER — Ambulatory Visit (HOSPITAL_COMMUNITY)
Admission: RE | Admit: 2012-07-01 | Discharge: 2012-07-01 | Disposition: A | Discharge status: Home / Self Care | Payer: Medicaid Other | Source: Ambulatory Visit | Attending: Obstetrics and Gynecology | Admitting: Obstetrics and Gynecology

## 2012-07-01 ENCOUNTER — Other Ambulatory Visit: Payer: 59

## 2012-07-01 ENCOUNTER — Encounter (HOSPITAL_COMMUNITY): Payer: Self-pay | Admitting: *Deleted

## 2012-07-01 ENCOUNTER — Encounter (HOSPITAL_COMMUNITY)
Admission: RE | Disposition: A | Discharge status: Home / Self Care | Payer: Self-pay | Source: Ambulatory Visit | Attending: Obstetrics and Gynecology

## 2012-07-01 ENCOUNTER — Ambulatory Visit (HOSPITAL_COMMUNITY): Payer: Medicaid Other | Admitting: Anesthesiology

## 2012-07-01 DIAGNOSIS — O3430 Maternal care for cervical incompetence, unspecified trimester: Secondary | ICD-10-CM

## 2012-07-01 HISTORY — PX: CERVICAL CERCLAGE: SHX1329

## 2012-07-01 LAB — CBC
HCT: 34.7 % — ABNORMAL LOW (ref 36.0–46.0)
MCV: 87.4 fL (ref 78.0–100.0)
RDW: 12.9 % (ref 11.5–15.5)
WBC: 10.5 10*3/uL (ref 4.0–10.5)

## 2012-07-01 SURGERY — CERCLAGE, CERVIX, VAGINAL APPROACH
Anesthesia: Spinal | Site: Cervix | Wound class: Clean Contaminated

## 2012-07-01 MED ORDER — LACTATED RINGERS IV SOLN
INTRAVENOUS | Status: DC
  Administered 2012-07-01 (×2): via INTRAVENOUS

## 2012-07-01 MED ORDER — CEFAZOLIN SODIUM-DEXTROSE 2-3 GM-% IV SOLR
INTRAVENOUS | Status: AC
  Filled 2012-07-01: qty 50

## 2012-07-01 MED ORDER — IBUPROFEN 600 MG PO TABS: 600.0000 mg | ORAL_TABLET | Freq: Three times a day (TID) | ORAL | Status: AC

## 2012-07-01 MED ORDER — STERILE WATER FOR IRRIGATION IR SOLN
Status: DC | PRN
  Administered 2012-07-01: 1000 mL

## 2012-07-01 MED ORDER — CEFAZOLIN SODIUM-DEXTROSE 2-3 GM-% IV SOLR
2.0000 g | Freq: Once | INTRAVENOUS | Status: AC
  Administered 2012-07-01: 2 g via INTRAVENOUS

## 2012-07-01 MED ORDER — BUPIVACAINE IN DEXTROSE 0.75-8.25 % IT SOLN
INTRATHECAL | Status: DC | PRN
  Administered 2012-07-01: 1.2 mL via INTRATHECAL

## 2012-07-01 MED ORDER — MEPERIDINE HCL 25 MG/ML IJ SOLN: 6.2500 mg | INTRAMUSCULAR | Status: DC | PRN

## 2012-07-01 MED ORDER — SODIUM CHLORIDE 0.9 % IR SOLN: Status: DC | PRN

## 2012-07-01 MED ORDER — LACTATED RINGERS IV SOLN: INTRAVENOUS | Status: DC | PRN

## 2012-07-01 MED ORDER — FENTANYL CITRATE 0.05 MG/ML IJ SOLN: 25.0000 ug | INTRAMUSCULAR | Status: DC | PRN

## 2012-07-01 MED ORDER — ONDANSETRON HCL 4 MG/2ML IJ SOLN: 4.0000 mg | Freq: Once | INTRAMUSCULAR | Status: DC | PRN

## 2012-07-01 MED ORDER — KETOROLAC TROMETHAMINE 30 MG/ML IJ SOLN: 15.0000 mg | Freq: Once | INTRAMUSCULAR | Status: DC | PRN

## 2012-07-01 SURGICAL SUPPLY — 21 items
CATH ROBINSON RED A/P 16FR (CATHETERS) ×2 IMPLANT
CLOTH BEACON ORANGE TIMEOUT ST (SAFETY) ×2 IMPLANT
COUNTER NEEDLE 1200 MAGNETIC (NEEDLE) ×1 IMPLANT
GLOVE BIOGEL PI IND STRL 7.0 (GLOVE) ×2 IMPLANT
GLOVE BIOGEL PI INDICATOR 7.0 (GLOVE) ×2
GOWN PREVENTION PLUS LG XLONG (DISPOSABLE) ×4 IMPLANT
NDL MA TROC 1/2 (NEEDLE) IMPLANT
NDL SPNL 22GX3.5 QUINCKE BK (NEEDLE) IMPLANT
NEEDLE MA TROC 1/2 (NEEDLE) IMPLANT
NEEDLE MAYO .5 CIRCLE (NEEDLE) ×2 IMPLANT
NEEDLE SPNL 22GX3.5 QUINCKE BK (NEEDLE) IMPLANT
PACK VAGINAL MINOR WOMEN LF (CUSTOM PROCEDURE TRAY) ×2 IMPLANT
PAD PREP 24X48 CUFFED NSTRL (MISCELLANEOUS) ×2 IMPLANT
SUT MERSILENE 5MM BP 1 12 (SUTURE) ×2 IMPLANT
SUT PROLENE 0 CT 1 30 (SUTURE) ×2 IMPLANT
SYR CONTROL 10ML LL (SYRINGE) IMPLANT
TOWEL OR 17X24 6PK STRL BLUE (TOWEL DISPOSABLE) ×4 IMPLANT
TRAY FOLEY CATH 14FR (SET/KITS/TRAYS/PACK) IMPLANT
TUBING NON-CON 1/4 X 20 CONN (TUBING) IMPLANT
WATER STERILE IRR 1000ML POUR (IV SOLUTION) ×2 IMPLANT
YANKAUER SUCT BULB TIP NO VENT (SUCTIONS) IMPLANT

## 2012-07-01 NOTE — Progress Notes (Signed)
Instructions were reviewed at 1800, not 1700

## 2012-07-01 NOTE — Progress Notes (Signed)
Patient was unable to void.  Pt states that she feels like her bladder is full.  Bladder was scanned and there is 800 cc of urine noted.  Dr. Arby Barrette made aware and orders received.

## 2012-07-01 NOTE — Op Note (Signed)
Preoperative diagnosis: Intrauterine pregnancy at 21 weeks and 1 day with cervical incompetence  Postoperative diagnosis: Same  Anesthesia: Spinal  Anesthesiologist: Dr. Arby Barrette  Procedure: Cervical cerclage  Surgeon: Dr. Dois Davenport Donnetta Gillin  Assistant: Sanda Klein CNM  Procedure:  After being informed of the planned procedure with possible complications including but not limited to bleeding, infection, accidental rupture of membranes, failure to prevent preterm delivery, informed consent is obtained and patient is taken to or #3. She is given spinal anesthesia without complication. She is placed in the lithotomy position prepped and draped in a sterile fashion with knee-high sequential compressive devices.  Pelvic exam reveals a closed cervix that is shortened to approximately 0.5 cm. The lower uterine segment is defined.  A weighted speculum is inserted in the vagina and the anterior lip of the cervix is grasped with a open ring forcep. Starting at 12:00, we perform a cervical cerclage using a 0 Mersilene tape on a 18-24 4D needle. The cerclage is tightened and tied with a 0 Prolene tag. Bleeding is minimal. Instruments are removed. The bladder is emptied with an in and out catheter for 200 cc of urine. A postoperative pelvic exam reveals a closed cervix with a cervical length closer to the pre-operative evaluation of 1 cm.  Instrument and sponge count is complete x2. Estimated blood loss is minimal. The procedure is well tolerated by the patient is taken to recovery room in a well and stable condition.  Specimen: None

## 2012-07-01 NOTE — Anesthesia Postprocedure Evaluation (Signed)
Anesthesia Post Note  Patient: Sarah Sutton  Procedure(s) Performed: Procedure(s) (LRB): CERCLAGE CERVICAL (N/A)  Anesthesia type: Spinal  Patient location: PACU  Post pain: Pain level controlled  Post assessment: Post-op Vital signs reviewed  Last Vitals:  Filed Vitals:   07/01/12 1515  BP:   Pulse: 80  Temp:   Resp: 18    Post vital signs: Reviewed  Level of consciousness: awake  Complications: No apparent anesthesia complications

## 2012-07-01 NOTE — Anesthesia Preprocedure Evaluation (Signed)
Anesthesia Evaluation  Patient identified by MRN, date of birth, ID band Patient awake    Reviewed: Allergy & Precautions, H&P , NPO status , Patient's Chart, lab work & pertinent test results  Airway Mallampati: II TM Distance: >3 FB Neck ROM: full    Dental No notable dental hx.    Pulmonary neg pulmonary ROS,    Pulmonary exam normal       Cardiovascular negative cardio ROS      Neuro/Psych negative neurological ROS  negative psych ROS   GI/Hepatic negative GI ROS, Neg liver ROS,   Endo/Other  Morbid obesity  Renal/GU negative Renal ROS  negative genitourinary   Musculoskeletal negative musculoskeletal ROS (+)   Abdominal (+) + obese,   Peds negative pediatric ROS (+)  Hematology negative hematology ROS (+)   Anesthesia Other Findings   Reproductive/Obstetrics (+) Pregnancy                           Anesthesia Physical Anesthesia Plan  ASA: III  Anesthesia Plan: Spinal   Post-op Pain Management:    Induction:   Airway Management Planned:   Additional Equipment:   Intra-op Plan:   Post-operative Plan:   Informed Consent: I have reviewed the patients History and Physical, chart, labs and discussed the procedure including the risks, benefits and alternatives for the proposed anesthesia with the patient or authorized representative who has indicated his/her understanding and acceptance.     Plan Discussed with: CRNA and Surgeon  Anesthesia Plan Comments:         Anesthesia Quick Evaluation  

## 2012-07-01 NOTE — Interval H&P Note (Signed)
History and Physical Interval Note:  07/01/2012 1:56 PM  Sarah Sutton  has presented today for surgery, with the diagnosis of Shortened Cervix  The various methods of treatment have been discussed with the patient and family. After consideration of risks, benefits and other options for treatment, the patient has consented to  Procedure(s) (LRB): CERCLAGE CERVICAL (N/A) as a surgical intervention .  The patient's history has been reviewed, patient examined, no change in status, stable for surgery.  I have reviewed the patient's chart and labs.  Questions were answered to the patient's satisfaction.     Devaunte Gasparini A

## 2012-07-01 NOTE — Anesthesia Procedure Notes (Signed)
Spinal  Patient location during procedure: OR Start time: 07/01/2012 2:10 PM End time: 07/01/2012 2:15 PM Staffing Anesthesiologist: Sandrea Hughs Performed by: anesthesiologist  Preanesthetic Checklist Completed: patient identified, site marked, surgical consent, pre-op evaluation, timeout performed, IV checked, risks and benefits discussed and monitors and equipment checked Spinal Block Patient position: sitting Prep: DuraPrep Patient monitoring: heart rate, cardiac monitor, continuous pulse ox and blood pressure Approach: midline Location: L3-4 Injection technique: single-shot Needle Needle type: Sprotte  Needle gauge: 24 G Needle length: 9 cm Needle insertion depth: 8 cm Assessment Sensory level: T10

## 2012-07-01 NOTE — Transfer of Care (Signed)
Immediate Anesthesia Transfer of Care Note  Patient: Sarah Sutton  Procedure(s) Performed: Procedure(s) (LRB): CERCLAGE CERVICAL (N/A)  Patient Location: PACU  Anesthesia Type: Spinal  Level of Consciousness: awake, alert  and oriented  Airway & Oxygen Therapy: Patient Spontanous Breathing  Post-op Assessment: Report given to PACU RN and Post -op Vital signs reviewed and stable  Post vital signs: stable  Complications: No apparent anesthesia complications

## 2012-07-02 ENCOUNTER — Encounter (HOSPITAL_COMMUNITY): Payer: Self-pay | Admitting: Obstetrics and Gynecology

## 2012-07-05 ENCOUNTER — Other Ambulatory Visit: Payer: Medicaid Other

## 2012-07-05 DIAGNOSIS — O09219 Supervision of pregnancy with history of pre-term labor, unspecified trimester: Secondary | ICD-10-CM

## 2012-07-05 MED ORDER — HYDROXYPROGESTERONE CAPROATE 250 MG/ML IM OIL
250.0000 mg | TOPICAL_OIL | Freq: Once | INTRAMUSCULAR | Status: AC
  Administered 2012-07-05: 250 mg via INTRAMUSCULAR

## 2012-07-08 ENCOUNTER — Ambulatory Visit (INDEPENDENT_AMBULATORY_CARE_PROVIDER_SITE_OTHER): Payer: Medicaid Other | Admitting: Obstetrics and Gynecology

## 2012-07-08 VITALS — BP 142/60 | Wt 240.0 lb

## 2012-07-08 DIAGNOSIS — O26879 Cervical shortening, unspecified trimester: Secondary | ICD-10-CM

## 2012-07-08 NOTE — Progress Notes (Signed)
Pt c/o feet burning.  No swelling in feet. Also c/o some pain in vagina.  BP increased today.  Repeat BP  136/76

## 2012-07-08 NOTE — Progress Notes (Signed)
[redacted]w[redacted]d POD# 7 cerclage: no complaints, No bleeding, no LOF, No contractions No symptoms of PIH. BP rechecked:136/76 Stitch in place.Pt advised of pelvic rest.

## 2012-07-12 ENCOUNTER — Telehealth: Payer: Self-pay

## 2012-07-12 ENCOUNTER — Encounter: Payer: 59 | Admitting: Obstetrics and Gynecology

## 2012-07-12 ENCOUNTER — Other Ambulatory Visit: Payer: Medicaid Other

## 2012-07-12 DIAGNOSIS — O09219 Supervision of pregnancy with history of pre-term labor, unspecified trimester: Secondary | ICD-10-CM

## 2012-07-12 MED ORDER — HYDROXYPROGESTERONE CAPROATE 250 MG/ML IM OIL
250.0000 mg | TOPICAL_OIL | Freq: Once | INTRAMUSCULAR | Status: AC
  Administered 2012-07-12: 250 mg via INTRAMUSCULAR

## 2012-07-12 NOTE — Telephone Encounter (Signed)
LM for pt to cb re: whether or not she wants to go ahead with [facility] testing. It's not clear in chart documentation if she had made final decision. Melody Comas A

## 2012-07-16 ENCOUNTER — Telehealth: Payer: Self-pay

## 2012-07-16 NOTE — Telephone Encounter (Signed)
Since I have not heard back from pt re Harmony testing, I edited appt notes to read" verify whether pt wants Harmony testing" at her visit on 07/19/2012 with Dr. Estanislado Pandy. Melody Comas A

## 2012-07-19 ENCOUNTER — Other Ambulatory Visit: Payer: Medicaid Other

## 2012-07-19 DIAGNOSIS — O09219 Supervision of pregnancy with history of pre-term labor, unspecified trimester: Secondary | ICD-10-CM

## 2012-07-19 MED ORDER — HYDROXYPROGESTERONE CAPROATE 250 MG/ML IM OIL
250.0000 mg | TOPICAL_OIL | Freq: Once | INTRAMUSCULAR | Status: AC
  Administered 2012-07-19: 250 mg via INTRAMUSCULAR

## 2012-07-20 ENCOUNTER — Inpatient Hospital Stay (HOSPITAL_COMMUNITY): Payer: Medicaid Other

## 2012-07-20 ENCOUNTER — Inpatient Hospital Stay (HOSPITAL_COMMUNITY)
Admission: AD | Admit: 2012-07-20 | Discharge: 2012-07-20 | Disposition: A | Discharge status: Home / Self Care | Payer: Medicaid Other | Source: Ambulatory Visit | Attending: Obstetrics and Gynecology | Admitting: Obstetrics and Gynecology

## 2012-07-20 ENCOUNTER — Telehealth: Payer: Self-pay | Admitting: Obstetrics and Gynecology

## 2012-07-20 ENCOUNTER — Encounter (HOSPITAL_COMMUNITY): Payer: Self-pay | Admitting: *Deleted

## 2012-07-20 DIAGNOSIS — O26879 Cervical shortening, unspecified trimester: Secondary | ICD-10-CM | POA: Insufficient documentation

## 2012-07-20 DIAGNOSIS — N949 Unspecified condition associated with female genital organs and menstrual cycle: Secondary | ICD-10-CM | POA: Insufficient documentation

## 2012-07-20 DIAGNOSIS — R109 Unspecified abdominal pain: Secondary | ICD-10-CM | POA: Insufficient documentation

## 2012-07-20 LAB — URINE MICROSCOPIC-ADD ON

## 2012-07-20 LAB — URINALYSIS, ROUTINE W REFLEX MICROSCOPIC
Glucose, UA: NEGATIVE mg/dL
Protein, ur: 30 mg/dL — AB

## 2012-07-20 MED ORDER — IBUPROFEN 600 MG PO TABS: 600.0000 mg | ORAL_TABLET | Freq: Four times a day (QID) | ORAL | Status: AC | PRN

## 2012-07-20 NOTE — Telephone Encounter (Signed)
TC from patient--23 weeks, hx cerclage placement 3 weeks ago for short cervix.  Now feeling pain/pressure in right side of vagina, worse today.  Denies leaking or bleeding, reports +FM, denies dysuria.  Issue reviewed--recommended patient come to MAU for evaluation.

## 2012-07-20 NOTE — MAU Note (Signed)
Pt presents to MAU for vaginal pressure.

## 2012-07-20 NOTE — MAU Provider Note (Signed)
History   21 yo G1P0 at 58 6/7 weeks presented after calling with c/o right sided vaginal pain/pressure/cramping.  Hx cerclage on 8/5 for progressive cervical shortening.  Noted increased sharp pressure and pain, particularly on the right side deep in the groin over the last 24 hours.  Worse with walking and turning movements.  Denies leaking or bleeding, no recent IC, and reports +FM.  Has been instructed to avoid heavy lifting, but is not on bedrest.  Reports urine has had "strong odor" recently.  On 17 P weekly, with doses on Friday. Has next provider appointment on Monday.  Pregnancy remarkable for: Patient Active Problem List  Diagnosis  . Polycystic ovarian syndrome  . Galactorrhea on right side  . Trichomonas contact, treated  . Obesity  . Abnormal facial hair  . Abnormal quad screen  . Cervical shortening complicating pregnancy--cerclage placed 07/01/12      OB History    Grav Para Term Preterm Abortions TAB SAB Ect Mult Living   1               Past Medical History  Diagnosis Date  . Polycystic ovarian syndrome 2009    by Dr. Gaynell Face  . Diabetes mellitus     PER PT BORDERLINE  . Anemia     NO MEDS IN PAST  . Heart murmur     AS A CHILD  . Infection     UTI;NOT FREQ  . Infection     YEAST INF;NOT FREQ  . Infection     BV X 1;CURRENTLY TAKING FLAGYL FOR EX  . Abnormal Pap smear 2007    COLPO DONE;LAST PAP 03/2011 WAS NORMAL    Past Surgical History  Procedure Date  . Cholecystectomy november 2009  . Colposcopy 2009  . Wisdom tooth extraction     ALL 4 REMOVED  . Cervical cerclage 07/01/2012    Procedure: CERCLAGE CERVICAL;  Surgeon: Esmeralda Arthur, MD;  Location: [facility];  Service: Gynecology;  Laterality: N/A;    Family History  Problem Relation Age of Onset  . Asthma Mother   . Diabetes Father   . Hypertension Father   . Cancer Maternal Grandmother     bone cancer  . Cancer Paternal Grandmother     breast cancer    History  Substance Use Topics   . Smoking status: Current Everyday Smoker -- 0.2 packs/day    Types: Cigarettes    Last Attempt to Quit: 03/14/2012  . Smokeless tobacco: Never Used   Comment: smoked marijuana x2 since finding out she was pregnant for nausea  . Alcohol Use: No    Allergies: No Known Allergies  Prescriptions prior to admission  Medication Sig Dispense Refill  . Prenatal Vit-Fe Fumarate-FA (PRENATAL MULTIVITAMIN) TABS Take 1 tablet by mouth daily.         Physical Exam   Blood pressure 154/79, pulse 118, temperature 98 F (36.7 C), temperature source Oral, resp. rate 20, last menstrual period 01/17/2012.  Chest clear Heart RRR without murmur Abd gravid, NT Pelvic--cerclage appears intact to speculum exam, with small amount white d/c in vault.  Stitch intact to gentle digital exam. Ext WNL Area of right round ligament tender to touch--this is where patient localizes her pain.  FHR 150s per quick assessment prior to [place]    ED Course  IUP at 23 6/7 weeks Hx cervical shortening, s/p cerclage 8/5 Probable round ligament pain  Plan: Check UA [place] for cervical length/cerclage status Anticipate if cervix is  stable, will d/c home with comfort measures for RLP.  Nigel Bridgeman CNM, [place] 07/20/2012 12:18 PM  Addendum: Returned from [place]: Vtx, cervical length 1.3, appears funnelled, with funnel length 2.9 cm, funnel width 2.9.  Appears dynamic.  Cerclage visualized.  FHR 150s No UCs.  Results for orders placed during the hospital encounter of 07/01/12 (from the past 24 hour(s))  URINALYSIS, ROUTINE W REFLEX MICROSCOPIC     Status: Abnormal   Collection Time   07/20/12 11:55 AM      Component Value Range   Color, Urine YELLOW  YELLOW   APPearance HAZY (*) CLEAR   Specific Gravity, Urine >1.030 (*) 1.005 - 1.030   pH 6.5  5.0 - 8.0   Glucose, UA NEGATIVE  NEGATIVE mg/dL   Hgb urine dipstick MODERATE (*) NEGATIVE   Bilirubin Urine NEGATIVE  NEGATIVE   Ketones, ur 15 (*) NEGATIVE mg/dL    Protein, ur 30 (*) NEGATIVE mg/dL   Urobilinogen, UA 0.2  0.0 - 1.0 mg/dL   Nitrite NEGATIVE  NEGATIVE   Leukocytes, UA NEGATIVE  NEGATIVE  URINE MICROSCOPIC-ADD ON     Status: Abnormal   Collection Time   07/20/12 11:55 AM      Component Value Range   Squamous Epithelial / LPF FEW (*) RARE   WBC, UA 0-2  <3 WBC/hpf   RBC / HPF 3-6  <3 RBC/hpf   Bacteria, UA FEW (*) RARE   Urine-Other MUCOUS PRESENT    WET PREP, GENITAL     Status: Abnormal   Collection Time   07/20/12 12:12 PM      Component Value Range   Yeast Wet Prep HPF POC NONE SEEN  NONE SEEN   Trich, Wet Prep NONE SEEN  NONE SEEN   Clue Cells Wet Prep HPF POC FEW (*) NONE SEEN   WBC, Wet Prep HPF POC FEW (*) NONE SEEN     Consulted with Dr. Stefano Gaul. D/C home on bedrest. Ibuprophen 600 mg po q 6 hours ATC (patient has that at home), with refill called to patient's pharmacy. Keep scheduled appointment on Monday with Dr. Estanislado Pandy. (I will send information on today's visit to her.) S/S PTL reviewed with patient, also comfort measures for RLP discussed. F/U on urine culture on Monday. Patient to push fluids.  Nigel Bridgeman, CNM 07/20/12 1:45p

## 2012-07-22 ENCOUNTER — Encounter: Payer: Self-pay | Admitting: Obstetrics and Gynecology

## 2012-07-22 ENCOUNTER — Telehealth: Payer: Self-pay | Admitting: Obstetrics and Gynecology

## 2012-07-22 ENCOUNTER — Ambulatory Visit (INDEPENDENT_AMBULATORY_CARE_PROVIDER_SITE_OTHER): Payer: Medicaid Other | Admitting: Obstetrics and Gynecology

## 2012-07-22 VITALS — BP 128/60 | Wt 239.0 lb

## 2012-07-22 DIAGNOSIS — O280 Abnormal hematological finding on antenatal screening of mother: Secondary | ICD-10-CM

## 2012-07-22 DIAGNOSIS — O289 Unspecified abnormal findings on antenatal screening of mother: Secondary | ICD-10-CM

## 2012-07-22 DIAGNOSIS — Z331 Pregnant state, incidental: Secondary | ICD-10-CM

## 2012-07-22 DIAGNOSIS — N883 Incompetence of cervix uteri: Secondary | ICD-10-CM

## 2012-07-22 NOTE — Progress Notes (Signed)
[redacted]w[redacted]d Some muscle pain. No contractions or change in D/C: suggest Ibuprofen PRN and maternity belt Quad screen: 1:99 DSR  Pt desires Harmony today

## 2012-07-22 NOTE — Progress Notes (Signed)
Pt states no concerns today, declined [facility]ony test.

## 2012-07-23 LAB — URINE CULTURE

## 2012-07-26 ENCOUNTER — Other Ambulatory Visit: Payer: Medicaid Other

## 2012-07-26 DIAGNOSIS — O09219 Supervision of pregnancy with history of pre-term labor, unspecified trimester: Secondary | ICD-10-CM

## 2012-07-26 MED ORDER — HYDROXYPROGESTERONE CAPROATE 250 MG/ML IM OIL
250.0000 mg | TOPICAL_OIL | Freq: Once | INTRAMUSCULAR | Status: AC
  Administered 2012-07-26: 250 mg via INTRAMUSCULAR

## 2012-07-31 ENCOUNTER — Telehealth: Payer: Self-pay

## 2012-07-31 NOTE — Telephone Encounter (Signed)
Message copied by Larwance Rote on Wed Jul 31, 2012 10:17 AM ------      Message from: Silverio Lay      Created: Tue Jul 30, 2012  1:41 PM      Regarding: Cathlean Sauer results       Please obtain results

## 2012-08-02 ENCOUNTER — Encounter: Payer: Self-pay | Admitting: Obstetrics and Gynecology

## 2012-08-02 ENCOUNTER — Other Ambulatory Visit: Payer: Medicaid Other

## 2012-08-02 ENCOUNTER — Ambulatory Visit (INDEPENDENT_AMBULATORY_CARE_PROVIDER_SITE_OTHER): Payer: Medicaid Other | Admitting: Obstetrics and Gynecology

## 2012-08-02 VITALS — BP 120/60 | Wt 243.0 lb

## 2012-08-02 DIAGNOSIS — O09219 Supervision of pregnancy with history of pre-term labor, unspecified trimester: Secondary | ICD-10-CM

## 2012-08-02 DIAGNOSIS — O3430 Maternal care for cervical incompetence, unspecified trimester: Secondary | ICD-10-CM

## 2012-08-02 LAB — FETAL FIBRONECTIN: Fetal Fibronectin: POSITIVE — AB

## 2012-08-02 MED ORDER — HYDROXYPROGESTERONE CAPROATE 250 MG/ML IM OIL
250.0000 mg | TOPICAL_OIL | Freq: Once | INTRAMUSCULAR | Status: AC
  Administered 2012-08-02: 250 mg via INTRAMUSCULAR

## 2012-08-02 MED ORDER — HYDROXYPROGESTERONE CAPROATE 250 MG/ML IM OIL: 250.0000 mg | TOPICAL_OIL | Freq: Once | INTRAMUSCULAR | Status: DC

## 2012-08-02 NOTE — Progress Notes (Signed)
[redacted]w[redacted]d GFM. Pelvic pressure with contractions perceived at night max less than 6 per hour: FFN done VE: stitch intact

## 2012-08-02 NOTE — Progress Notes (Signed)
Pt states no concerns today.   

## 2012-08-05 ENCOUNTER — Encounter: Payer: Self-pay | Admitting: Obstetrics and Gynecology

## 2012-08-06 ENCOUNTER — Telehealth: Payer: Self-pay

## 2012-08-06 NOTE — Telephone Encounter (Signed)
LMTC at 5:00  ld

## 2012-08-07 ENCOUNTER — Telehealth: Payer: Self-pay | Admitting: Obstetrics and Gynecology

## 2012-08-07 ENCOUNTER — Encounter (HOSPITAL_COMMUNITY): Payer: Self-pay | Admitting: *Deleted

## 2012-08-07 ENCOUNTER — Inpatient Hospital Stay (HOSPITAL_COMMUNITY)
Admission: AD | Admit: 2012-08-07 | Discharge: 2012-08-07 | Disposition: A | Discharge status: Home / Self Care | Payer: Medicaid Other | Source: Ambulatory Visit | Attending: Obstetrics and Gynecology | Admitting: Obstetrics and Gynecology

## 2012-08-07 DIAGNOSIS — O4700 False labor before 37 completed weeks of gestation, unspecified trimester: Secondary | ICD-10-CM | POA: Insufficient documentation

## 2012-08-07 DIAGNOSIS — O26879 Cervical shortening, unspecified trimester: Secondary | ICD-10-CM

## 2012-08-07 DIAGNOSIS — O280 Abnormal hematological finding on antenatal screening of mother: Secondary | ICD-10-CM

## 2012-08-07 MED ORDER — BETAMETHASONE SOD PHOS & ACET 6 (3-3) MG/ML IJ SUSP
12.0000 mg | Freq: Once | INTRAMUSCULAR | Status: AC
  Administered 2012-08-07: 12 mg via INTRAMUSCULAR
  Filled 2012-08-07: qty 2

## 2012-08-07 NOTE — Telephone Encounter (Signed)
Spoke with pt to make sure she was notified of + FFN. Pt states she was not called.  Per SR, pt is to go to MAU for BTM and monitoring. Dee contacted to notify of situation and instruction per SR.  Pt agreeable.  ld

## 2012-08-07 NOTE — MAU Provider Note (Signed)
History    Patient had a Positive FFN 08/02/12 F/u for Steroids (Betamethasone 12mg  im x 2 doses)  CSN: [id number]  Arrival date and time: 08/07/12 1145   None     No chief complaint on file.  HPI  OB History    Grav Para Term Preterm Abortions TAB SAB Ect Mult Living   1               Past Medical History  Diagnosis Date  . Polycystic ovarian syndrome 2009    by Dr. Gaynell Face  . Diabetes mellitus     PER PT BORDERLINE  . Anemia     NO MEDS IN PAST  . Heart murmur     AS A CHILD  . Infection     UTI;NOT FREQ  . Infection     YEAST INF;NOT FREQ  . Infection     BV X 1;CURRENTLY TAKING FLAGYL FOR EX  . Abnormal Pap smear 2007    COLPO DONE;LAST PAP 03/2011 WAS NORMAL    Past Surgical History  Procedure Date  . Cholecystectomy november 2009  . Colposcopy 2009  . Wisdom tooth extraction     ALL 4 REMOVED  . Cervical cerclage 07/01/2012    Procedure: CERCLAGE CERVICAL;  Surgeon: Esmeralda Arthur, MD;  Location: [facility];  Service: Gynecology;  Laterality: N/A;    Family History  Problem Relation Age of Onset  . Asthma Mother   . Diabetes Father   . Hypertension Father   . Cancer Maternal Grandmother     bone cancer  . Cancer Paternal Grandmother     breast cancer    History  Substance Use Topics  . Smoking status: Current Every Day Smoker -- 0.2 packs/day    Types: Cigarettes    Last Attempt to Quit: 03/14/2012  . Smokeless tobacco: Never Used   Comment: smoked marijuana x2 since finding out she was pregnant for nausea  . Alcohol Use: No    Allergies: No Known Allergies  Prescriptions prior to admission  Medication Sig Dispense Refill  . hydroxyprogesterone caproate (DELALUTIN) 250 mg/mL OIL Inject 250 mg into the muscle once.      . Prenatal Vit-Fe Fumarate-FA (PRENATAL MULTIVITAMIN) TABS Take 1 tablet by mouth daily.        Review of Systems  Constitutional: Negative.   HENT: Negative.   Eyes: Negative.   Respiratory: Negative.     Cardiovascular: Negative.   Gastrointestinal:       Cholecystectomy 2009 - has loose stool since operation.  Genitourinary: Negative.   Musculoskeletal: Negative.   Skin: Negative.   Neurological: Negative.   Endo/Heme/Allergies: Negative.   Psychiatric/Behavioral: Negative.    Physical Exam   Blood pressure 118/79, pulse 117, temperature 98.2 F (36.8 C), temperature source Oral, resp. rate 18, height 5\' 8"  (1.727 m), weight 241 lb 3.2 oz (109.408 kg), last menstrual period 01/17/2012, SpO2 100.00%.  Physical Exam  Constitutional: She is oriented to person, place, and time. She appears well-developed and well-nourished.  HENT:  Head: Normocephalic and atraumatic.  Neck: Normal range of motion. Neck supple.  Cardiovascular: Normal rate, regular rhythm and normal heart sounds.   Respiratory: Effort normal and breath sounds normal.  GI: Soft. Bowel sounds are normal.  Genitourinary: Vagina normal.       This patient has a cerclage in place and is taking 17P weekly  Musculoskeletal: Normal range of motion.  Neurological: She is alert and oriented to person, place, and time. She has  normal reflexes.  Skin: Skin is warm and dry.  Psychiatric: She has a normal mood and affect.    MAU Course  Procedures EFM x 2 hrs, No contractions noted during the 2 hr interval. 1st dose of steroids (Betamethasone) given in MAU at this visit D/c home and to office [facility] for 17p injection on Friday 08/10/12.Pa   Assessment and Plan  FFN  Positive 08/02/12.  Betamethasone Course  Of 2 injections commenced today 1st dose today in MAU To return in 24 hrs for 2nd dose. (Patient has been instructed to return to MAU in 24 hrs and verbalizes understanding) Has f/u with Dr. Estanislado Pandy 08/17/12  Sarah Sutton, CNM/ 08/07/2012, 1:48 PM

## 2012-08-07 NOTE — MAU Note (Signed)
Patient states she had a FFN in the office on Friday, was called yesterday and told the FFN was positive and told to come to MAU for monitoring and steriod shots. Patient states she feels some balling up but no real pain. Reports good fetal movement.

## 2012-08-08 ENCOUNTER — Inpatient Hospital Stay (HOSPITAL_COMMUNITY)
Admission: AD | Admit: 2012-08-08 | Discharge: 2012-08-08 | Disposition: A | Discharge status: Home / Self Care | Payer: Medicaid Other | Source: Ambulatory Visit | Attending: Obstetrics and Gynecology | Admitting: Obstetrics and Gynecology

## 2012-08-08 DIAGNOSIS — O4700 False labor before 37 completed weeks of gestation, unspecified trimester: Secondary | ICD-10-CM | POA: Insufficient documentation

## 2012-08-08 DIAGNOSIS — O26879 Cervical shortening, unspecified trimester: Secondary | ICD-10-CM | POA: Insufficient documentation

## 2012-08-08 MED ORDER — BETAMETHASONE SOD PHOS & ACET 6 (3-3) MG/ML IJ SUSP
12.0000 mg | Freq: Once | INTRAMUSCULAR | Status: AC
  Administered 2012-08-08: 12 mg via INTRAMUSCULAR
  Filled 2012-08-08: qty 2

## 2012-08-09 ENCOUNTER — Other Ambulatory Visit: Payer: Medicaid Other

## 2012-08-09 DIAGNOSIS — O09219 Supervision of pregnancy with history of pre-term labor, unspecified trimester: Secondary | ICD-10-CM

## 2012-08-09 MED ORDER — HYDROXYPROGESTERONE CAPROATE 250 MG/ML IM OIL
250.0000 mg | TOPICAL_OIL | Freq: Once | INTRAMUSCULAR | Status: AC
  Administered 2012-08-09: 250 mg via INTRAMUSCULAR

## 2012-08-16 ENCOUNTER — Ambulatory Visit (INDEPENDENT_AMBULATORY_CARE_PROVIDER_SITE_OTHER): Payer: Medicaid Other | Admitting: Obstetrics and Gynecology

## 2012-08-16 ENCOUNTER — Other Ambulatory Visit: Payer: Medicaid Other

## 2012-08-16 ENCOUNTER — Encounter: Payer: Self-pay | Admitting: Obstetrics and Gynecology

## 2012-08-16 VITALS — BP 114/58 | Wt 242.0 lb

## 2012-08-16 DIAGNOSIS — O09219 Supervision of pregnancy with history of pre-term labor, unspecified trimester: Secondary | ICD-10-CM

## 2012-08-16 DIAGNOSIS — O26879 Cervical shortening, unspecified trimester: Secondary | ICD-10-CM

## 2012-08-16 MED ORDER — HYDROXYPROGESTERONE CAPROATE 250 MG/ML IM OIL
250.0000 mg | TOPICAL_OIL | Freq: Once | INTRAMUSCULAR | Status: AC
  Administered 2012-08-16: 250 mg via INTRAMUSCULAR

## 2012-08-16 NOTE — Progress Notes (Signed)
[redacted]w[redacted]d Sporadic cramping without true contraction or change in discharge

## 2012-08-18 ENCOUNTER — Inpatient Hospital Stay (HOSPITAL_COMMUNITY): Payer: Medicaid Other

## 2012-08-18 ENCOUNTER — Inpatient Hospital Stay (HOSPITAL_COMMUNITY)
Admission: AD | Admit: 2012-08-18 | Discharge: 2012-10-02 | DRG: 775 | Disposition: A | Discharge status: Home / Self Care | Payer: Medicaid Other | Source: Ambulatory Visit | Attending: Obstetrics and Gynecology | Admitting: Obstetrics and Gynecology

## 2012-08-18 ENCOUNTER — Encounter (HOSPITAL_COMMUNITY): Payer: Self-pay

## 2012-08-18 ENCOUNTER — Telehealth: Payer: Self-pay | Admitting: Obstetrics and Gynecology

## 2012-08-18 DIAGNOSIS — O4290 Premature rupture of membranes, unspecified as to length of time between rupture and onset of labor, unspecified weeks of gestation: Secondary | ICD-10-CM

## 2012-08-18 DIAGNOSIS — A499 Bacterial infection, unspecified: Secondary | ICD-10-CM | POA: Diagnosis present

## 2012-08-18 DIAGNOSIS — O2390 Unspecified genitourinary tract infection in pregnancy, unspecified trimester: Secondary | ICD-10-CM | POA: Diagnosis present

## 2012-08-18 DIAGNOSIS — B9689 Other specified bacterial agents as the cause of diseases classified elsewhere: Secondary | ICD-10-CM | POA: Diagnosis present

## 2012-08-18 DIAGNOSIS — O34599 Maternal care for other abnormalities of gravid uterus, unspecified trimester: Secondary | ICD-10-CM | POA: Diagnosis present

## 2012-08-18 DIAGNOSIS — N83209 Unspecified ovarian cyst, unspecified side: Secondary | ICD-10-CM | POA: Diagnosis present

## 2012-08-18 DIAGNOSIS — O358XX0 Maternal care for other (suspected) fetal abnormality and damage, not applicable or unspecified: Secondary | ICD-10-CM | POA: Diagnosis present

## 2012-08-18 DIAGNOSIS — E669 Obesity, unspecified: Secondary | ICD-10-CM | POA: Diagnosis present

## 2012-08-18 DIAGNOSIS — O26879 Cervical shortening, unspecified trimester: Secondary | ICD-10-CM | POA: Diagnosis present

## 2012-08-18 DIAGNOSIS — O280 Abnormal hematological finding on antenatal screening of mother: Secondary | ICD-10-CM

## 2012-08-18 DIAGNOSIS — E282 Polycystic ovarian syndrome: Secondary | ICD-10-CM

## 2012-08-18 DIAGNOSIS — H6691 Otitis media, unspecified, right ear: Secondary | ICD-10-CM

## 2012-08-18 DIAGNOSIS — N760 Acute vaginitis: Secondary | ICD-10-CM | POA: Diagnosis present

## 2012-08-18 LAB — URINALYSIS, ROUTINE W REFLEX MICROSCOPIC
Ketones, ur: NEGATIVE mg/dL
Leukocytes, UA: NEGATIVE
Nitrite: NEGATIVE
Specific Gravity, Urine: 1.02 (ref 1.005–1.030)
pH: 6.5 (ref 5.0–8.0)

## 2012-08-18 LAB — CBC WITH DIFFERENTIAL/PLATELET
Basophils Absolute: 0 10*3/uL (ref 0.0–0.1)
Basophils Relative: 0 % (ref 0–1)
Hemoglobin: 11 g/dL — ABNORMAL LOW (ref 12.0–15.0)
MCHC: 33.5 g/dL (ref 30.0–36.0)
Monocytes Relative: 9 % (ref 3–12)
Neutro Abs: 6.1 10*3/uL (ref 1.7–7.7)
Neutrophils Relative %: 60 % (ref 43–77)
Platelets: 271 10*3/uL (ref 150–400)

## 2012-08-18 LAB — URINE MICROSCOPIC-ADD ON

## 2012-08-18 LAB — TYPE AND SCREEN
ABO/RH(D): A POS
Antibody Screen: NEGATIVE

## 2012-08-18 LAB — RPR: RPR Ser Ql: NONREACTIVE

## 2012-08-18 LAB — WET PREP, GENITAL

## 2012-08-18 MED ORDER — AMOXICILLIN 500 MG PO CAPS: 500.0000 mg | ORAL_CAPSULE | Freq: Three times a day (TID) | ORAL | Status: DC

## 2012-08-18 MED ORDER — CALCIUM CARBONATE ANTACID 500 MG PO CHEW
2.0000 | CHEWABLE_TABLET | ORAL | Status: DC | PRN
  Administered 2012-09-27: 400 mg via ORAL
  Filled 2012-08-18 (×2): qty 1

## 2012-08-18 MED ORDER — METRONIDAZOLE 500 MG PO TABS
500.0000 mg | ORAL_TABLET | Freq: Two times a day (BID) | ORAL | Status: AC
  Administered 2012-08-18 – 2012-08-24 (×14): 500 mg via ORAL
  Filled 2012-08-18 (×14): qty 1

## 2012-08-18 MED ORDER — METRONIDAZOLE 500 MG PO TABS
500.0000 mg | ORAL_TABLET | Freq: Two times a day (BID) | ORAL | Status: DC
  Filled 2012-08-18: qty 1

## 2012-08-18 MED ORDER — AMOXICILLIN 500 MG PO CAPS
500.0000 mg | ORAL_CAPSULE | Freq: Three times a day (TID) | ORAL | Status: DC
  Filled 2012-08-18 (×2): qty 1

## 2012-08-18 MED ORDER — SODIUM CHLORIDE 0.9 % IV SOLN
2.0000 g | Freq: Four times a day (QID) | INTRAVENOUS | Status: AC
  Administered 2012-08-18 – 2012-08-20 (×8): 2 g via INTRAVENOUS
  Filled 2012-08-18 (×8): qty 2000

## 2012-08-18 MED ORDER — ZOLPIDEM TARTRATE 5 MG PO TABS
5.0000 mg | ORAL_TABLET | Freq: Every evening | ORAL | Status: DC | PRN
  Administered 2012-08-18 – 2012-09-13 (×26): 5 mg via ORAL
  Filled 2012-08-18 (×26): qty 1

## 2012-08-18 MED ORDER — PRENATAL MULTIVITAMIN CH
1.0000 | ORAL_TABLET | Freq: Every day | ORAL | Status: DC
  Administered 2012-08-18 – 2012-09-26 (×38): 1 via ORAL
  Filled 2012-08-18 (×43): qty 1

## 2012-08-18 MED ORDER — AZITHROMYCIN 500 MG PO TABS
1000.0000 mg | ORAL_TABLET | Freq: Once | ORAL | Status: AC
  Administered 2012-08-18: 1000 mg via ORAL
  Filled 2012-08-18: qty 2

## 2012-08-18 MED ORDER — ERYTHROMYCIN BASE 250 MG PO TABS
250.0000 mg | ORAL_TABLET | Freq: Four times a day (QID) | ORAL | Status: AC
  Administered 2012-08-18 – 2012-08-24 (×26): 250 mg via ORAL
  Filled 2012-08-18 (×26): qty 1

## 2012-08-18 MED ORDER — DOCUSATE SODIUM 100 MG PO CAPS
100.0000 mg | ORAL_CAPSULE | Freq: Every day | ORAL | Status: DC
  Administered 2012-08-18 – 2012-09-22 (×10): 100 mg via ORAL
  Filled 2012-08-18 (×27): qty 1

## 2012-08-18 MED ORDER — LACTATED RINGERS IV SOLN
INTRAVENOUS | Status: DC
  Administered 2012-08-18 – 2012-08-22 (×11): via INTRAVENOUS

## 2012-08-18 MED ORDER — ACETAMINOPHEN 325 MG PO TABS
650.0000 mg | ORAL_TABLET | ORAL | Status: DC | PRN
  Administered 2012-08-26 – 2012-09-30 (×15): 650 mg via ORAL
  Filled 2012-08-18 (×16): qty 2

## 2012-08-18 NOTE — H&P (Signed)
Sarah Sutton is a 21 y.o. female, G1P0 at 53 weeks, presenting for SROM at 1am--+ amnisure in MAU and decreased AFI on [place].  Denies UCs, reports +FM.  No recent IC.  Hx +FFN on 9/6, with betamethasone on 9/11 and 9/12. Cerclage placed 8/5 due to cervical shortening at 19 weeks. Has received 17P weekly, with last dose 9/20.  Patient Active Problem List  Diagnosis  . Polycystic ovarian syndrome  . Galactorrhea on right side  . Trichomonas contact, treated  . Obesity  . Abnormal facial hair  . Abnormal quad screen  . 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 Risk of preterm labor--+ FFN 9/6.  Marland Kitchen ROM (rupture of membranes), premature  . Right fetal pyelectasis    History of present pregnancy: Patient entered care at 9 6/7 weeks.  EDC of 11/10/12 was established by 18 week [place] due to uncertain LMP.  Anatomy scan was done at 18 weeks, with cervical shortening at 1.7 cm noted and a posterior placenta.  Right fetal renal pyelectasis was also noted at 3.5 cm.  17P was initiated at that time, and f/u US in 1 weeks showed cx 1.4 cm.  Quad screen also showed elevated DS risk of 1:99, with normal Harmony testing done as f/u.  Repeat US was done at 20 5/7 weeks, with cervical length of 1 cm, with mild funnelling noted.  Cerclage was recommended at that time, and patient was in agreement.  Cerclage was placed on 07/01/12 by Dr. Estanislado Pandy without difficulty.  FFN was done on 9/6 for c/o some cramping at night, with + result returned.  Patient received betamethasone course on 9/11 and 9/12.   Last 17 P was on 9/20 (Friday).   OB History    Grav Para Term Preterm Abortions TAB SAB Ect Mult Living   1              Past Medical History  Diagnosis Date  . Polycystic ovarian syndrome 2009    by Dr. Gaynell Face  . Diabetes mellitus     PER PT BORDERLINE  . Anemia     NO MEDS IN PAST  . Heart murmur     AS A CHILD  . Infection     UTI;NOT FREQ  . Infection     YEAST INF;NOT FREQ    . Infection     BV X 1;CURRENTLY TAKING FLAGYL FOR EX  . Abnormal Pap smear 2007    COLPO DONE;LAST PAP 03/2011 WAS NORMAL   Past Surgical History  Procedure Date  . Cholecystectomy november 2009  . Colposcopy 2009  . Wisdom tooth extraction     ALL 4 REMOVED  . Cervical cerclage 07/01/2012    Procedure: CERCLAGE CERVICAL;  Surgeon: Esmeralda Arthur, MD;  Location: [facility];  Service: Gynecology;  Laterality: N/A;   Family History: family history includes Asthma in her mother; Cancer in her maternal grandmother and paternal grandmother; Diabetes in her father; and Hypertension in her father.  Social History:  reports that she quit smoking about 5 months ago. Her smoking use included Cigarettes. She smoked .25 packs per day. She has never used smokeless tobacco. She reports that she does not drink alcohol or use illicit drugs.   Prenatal Transfer Tool  Maternal Diabetes:  No Genetic Screening: Abnormal:  Results: Elevated risk of Trisomy 21 on Quad screen, but negative Harmony Maternal Ultrasounds/Referrals: Abnormal:  Findings:   Other: No cervical length, cerclage in place. Fetal Ultrasounds or other Referrals:  Other: Right fetal renal pyelectasis  Maternal Substance Abuse:  No Significant Maternal Medications:  None--received betamethasone 9/11-9/12. Significant Maternal Lab Results: Lab values include: Other: GBS and cultures pending from 08/18/12   ROS:  Leaking fluid, + FM  No Known Allergies   Dilation: Closed (cerclage intact) Exam by:: V Sarah Sutton CNM  Blood pressure 128/71, pulse 98, temperature 98 F (36.7 C), temperature source Oral, resp. rate 18, height 5' 7.5" (1.715 m), weight 245 lb 9.6 oz (111.403 kg), last menstrual period 01/17/2012.  Chest clear Heart RRR without murmur Abd gravid, NT, FH 28 cm Pelvic: Small amount thin d/c in vault, cerclage intact, cervix felt closed to gentle digital exam. Ext: WNL  FHR: reactive, no decels. UCs:  None  [place]:  Vtx, EFW  1041g, 2+5, 34%ile. AFI 10.13, 11%ile.   No measurable cervical length, cerclage intact, but external os 1 cm Right fetal renal pyelectasis 6.3 mm (3.5 mm at 18 weeks)  Prenatal labs: ABO, Rh: A/POS/-- (05/13 1120) Antibody: NEG (05/13 1120) Rubella:   Immune RPR: NON REAC (07/15 1332)  HBsAg: NEGATIVE (05/13 1120)  HIV: NON REACTIVE (05/13 1120)  GBS:  Pending from 08/17/12 Sickle cell/Hgb electrophoresis:  Negative Pap:  Normal 5/12 GC:  Negative 5/13 Chlamydia:  Negative 5/13 Genetic screenings:  Elevated DS risk on Quad screen, 1:99--normal Harmony testing Glucola:  WNL Hgb 13.5 at NOB, 11 at 28 weeks Other:  + FFN on 9/6.    Assessment/Plan: IUP at 28 weeks SROM at 1am--no labor Cerclage in place Hx +FFN 9/6--betamethasone course 9/11-9/12 BV Right fetal renal pyelectasis  Plan: Admit to Antenatal per consult with Dr. Normand Sloop ATB therapy for latency--Ampicillin 2 gm IV q 6 hours x 48 hours, Amoxicillin 500 mg po TID x 5 days, Azithromycin 1 gm po x 1 dose. GBS, GC, chlamydia pending IV Metronidazole 500 mg po BID x 7 days NICU consult   Per Dr. Normand Sloop, plan to maintain cerclage in place unless s/s of infection occur or labor ensues. Reviewed plan with patient regarding ATB for latency, maintaining cerclage at present, possible need for prolonged hospitalization, and anticipated preterm delivery.  Patient seems to understand these issues and is in agreement with the plan of care.  Sarah Sutton, [place], [place] 08/18/2012, 5:15 AM

## 2012-08-18 NOTE — Progress Notes (Signed)
Patient ID: Sarah Sutton, female   DOB: 11/04/1991, 21 y.o.   MRN: [id number] Pt without complaints.  minimal leakage of fluid or VB.  Good FM  BP 139/72  Pulse 91  Temp 98.2 F (36.8 C) (Oral)  Resp 20  Ht 5' 7.5" (1.715 m)  Wt 245 lb 9.6 oz (111.403 kg)  BMI 37.90 kg/m2  LMP 01/17/2012  FHTS Baseline: 140 bpm, Variability: Good {> 6 bpm), Accelerations: Reactive and Decelerations: Absent  Toco none  Pt in NAD CV RRR Lungs CTAB abd  Gravid soft and NT GU no vb EXt no calf tenderness Results for orders placed during the hospital encounter of 08/18/12 (from the past 72 hour(s))  URINALYSIS, ROUTINE W REFLEX MICROSCOPIC     Status: Abnormal   Collection Time   08/18/12  2:30 AM      Component Value Range Comment   Color, Urine YELLOW  YELLOW    APPearance CLEAR  CLEAR    Specific Gravity, Urine 1.020  1.005 - 1.030    pH 6.5  5.0 - 8.0    Glucose, UA NEGATIVE  NEGATIVE mg/dL    Hgb urine dipstick SMALL (*) NEGATIVE    Bilirubin Urine NEGATIVE  NEGATIVE    Ketones, ur NEGATIVE  NEGATIVE mg/dL    Protein, ur NEGATIVE  NEGATIVE mg/dL    Urobilinogen, UA 0.2  0.0 - 1.0 mg/dL    Nitrite NEGATIVE  NEGATIVE    Leukocytes, UA NEGATIVE  NEGATIVE   URINE MICROSCOPIC-ADD ON     Status: Abnormal   Collection Time   08/18/12  2:30 AM      Component Value Range Comment   Squamous Epithelial / LPF FEW (*) RARE    WBC, UA 0-2  <3 WBC/hpf    RBC / HPF 3-6  <3 RBC/hpf    Bacteria, UA FEW (*) RARE   AMNISURE RUPTURE OF MEMBRANE (ROM)     Status: Normal   Collection Time   08/18/12  2:55 AM      Component Value Range Comment   Amnisure ROM POSITIVE     WET PREP, GENITAL     Status: Abnormal   Collection Time   08/18/12  2:55 AM      Component Value Range Comment   Yeast Wet Prep HPF POC NONE SEEN  NONE SEEN    Trich, Wet Prep NONE SEEN  NONE SEEN    Clue Cells Wet Prep HPF POC FEW (*) NONE SEEN    WBC, Wet Prep HPF POC MODERATE (*) NONE SEEN MANY BACTERIA SEEN  POCT FERN TEST      Status: Normal   Collection Time   08/18/12  3:14 AM      Component Value Range Comment   Fern Test Negative     CBC WITH DIFFERENTIAL     Status: Abnormal   Collection Time   08/18/12  5:01 AM      Component Value Range Comment   WBC 10.1  4.0 - 10.5 K/uL    RBC 3.71 (*) 3.87 - 5.11 MIL/uL    Hemoglobin 11.0 (*) 12.0 - 15.0 g/dL    HCT 78.2 (*) 95.6 - 46.0 %    MCV 88.4  78.0 - 100.0 fL    MCH 29.6  26.0 - 34.0 pg    MCHC 33.5  30.0 - 36.0 g/dL    RDW 21.3  08.6 - 57.8 %    Platelets 271  150 - 400 K/uL    Neutrophils  Relative 60  43 - 77 %    Neutro Abs 6.1  1.7 - 7.7 K/uL    Lymphocytes Relative 30  12 - 46 %    Lymphs Abs 3.0  0.7 - 4.0 K/uL    Monocytes Relative 9  3 - 12 %    Monocytes Absolute 0.9  0.1 - 1.0 K/uL    Eosinophils Relative 2  0 - 5 %    Eosinophils Absolute 0.2  0.0 - 0.7 K/uL    Basophils Relative 0  0 - 1 %    Basophils Absolute 0.0  0.0 - 0.1 K/uL   RPR     Status: Normal   Collection Time   08/18/12  5:01 AM      Component Value Range Comment   RPR NON REACTIVE  NON REACTIVE   TYPE AND SCREEN     Status: Normal   Collection Time   08/18/12  5:01 AM      Component Value Range Comment   ABO/RH(D) A POS      Antibody Screen NEG      Sample Expiration 08/21/2012       Assessment and Plan [redacted]w[redacted]d  PProm Ampicillin and erythromycin for seven days GBS pending Flagyl for BV Will get NICu consult Pt doing well

## 2012-08-18 NOTE — MAU Note (Signed)
Felt "pop" & gush of fluid at 0100 this morning. Denies any pain or contractions. Denies vaginal bleeding or discharge. Positive fetal movement.

## 2012-08-18 NOTE — MAU Provider Note (Signed)
History   21 yo G1P0 at 28 weeks presented after reporting "pop" and gush of fluid.  Denies contractions, reports +FM.  Patient Active Problem List  Diagnosis  . Polycystic ovarian syndrome  . Galactorrhea on right side  . Trichomonas contact, treated  . Obesity  . Abnormal facial hair  . Abnormal quad screen  . Cervical shortening complicating pregnancy--cerclage placed 07/01/12  Hx +FFN on 9/6, with betamethasone on 9/11.   OB History    Grav Para Term Preterm Abortions TAB SAB Ect Mult Living   1               Past Medical History  Diagnosis Date  . Polycystic ovarian syndrome 2009    by Dr. Gaynell Face  . Diabetes mellitus     PER PT BORDERLINE  . Anemia     NO MEDS IN PAST  . Heart murmur     AS A CHILD  . Infection     UTI;NOT FREQ  . Infection     YEAST INF;NOT FREQ  . Infection     BV X 1;CURRENTLY TAKING FLAGYL FOR EX  . Abnormal Pap smear 2007    COLPO DONE;LAST PAP 03/2011 WAS NORMAL    Past Surgical History  Procedure Date  . Cholecystectomy november 2009  . Colposcopy 2009  . Wisdom tooth extraction     ALL 4 REMOVED  . Cervical cerclage 07/01/2012    Procedure: CERCLAGE CERVICAL;  Surgeon: Esmeralda Arthur, MD;  Location: [facility];  Service: Gynecology;  Laterality: N/A;    Family History  Problem Relation Age of Onset  . Asthma Mother   . Diabetes Father   . Hypertension Father   . Cancer Maternal Grandmother     bone cancer  . Cancer Paternal Grandmother     breast cancer    History  Substance Use Topics  . Smoking status: Current Every Day Smoker -- 0.2 packs/day    Types: Cigarettes    Last Attempt to Quit: 03/14/2012  . Smokeless tobacco: Never Used   Comment: smoked marijuana x2 since finding out she was pregnant for nausea  . Alcohol Use: No    Allergies: No Known Allergies  Prescriptions prior to admission  Medication Sig Dispense Refill  . hydroxyprogesterone caproate (DELALUTIN) 250 mg/mL OIL Inject 250 mg into the muscle once.       . Prenatal Vit-Fe Fumarate-FA (PRENATAL MULTIVITAMIN) TABS Take 1 tablet by mouth daily.         Physical Exam   Last menstrual period 01/17/2012.  Chest clear Heart RRR without murmur Abd gravid, NT Pelvic--no obvious leaking.  Small amount thin d/c in vault. Cerclage intact, cervix closed, firm. Ext WNL--no edema  FHR reactive, no decels No contractions  Fern negative  ED Course  IUP at 28 week Cerclage +FFN 9/6--s/p betamethasone 9/11  Plan: Amnisure GC, chlamydia, GBS, wet prep, UA   Chenita Ruda CNM, [place] 08/18/2012 2:32 AM  Addendum: Amnisure positive. Wet prep + BV  FHR reactive, no contractions  Will get [place] for growth, fluid, cervical status, and fetal position. Consult with Dr. Normand Sloop regarding admission orders.  Nigel Bridgeman, CNM 3:35a

## 2012-08-18 NOTE — Telephone Encounter (Signed)
TC from patient--felt "pop", then gush of fluid.   On 17 P, cerclage, hx +FFN. No contractions, +FM. Come to MAU.

## 2012-08-19 ENCOUNTER — Encounter (HOSPITAL_COMMUNITY): Payer: Self-pay | Admitting: *Deleted

## 2012-08-19 ENCOUNTER — Encounter: Payer: Self-pay | Admitting: Obstetrics and Gynecology

## 2012-08-19 DIAGNOSIS — O4290 Premature rupture of membranes, unspecified as to length of time between rupture and onset of labor, unspecified weeks of gestation: Secondary | ICD-10-CM

## 2012-08-19 LAB — GC/CHLAMYDIA PROBE AMP, GENITAL: Chlamydia, DNA Probe: NEGATIVE

## 2012-08-19 MED ORDER — SENNOSIDES-DOCUSATE SODIUM 8.6-50 MG PO TABS: 2.0000 | ORAL_TABLET | Freq: Every evening | ORAL | Status: DC | PRN

## 2012-08-19 NOTE — Progress Notes (Addendum)
Metha Kolasa is a 21 y.o. G1P0 at [redacted]w[redacted]d Reason for Admission:21615} Patient Active Problem List  Diagnosis  . Polycystic ovarian syndrome  . Galactorrhea on right side  . Trichomonas contact, treated  . Obesity  . Abnormal facial hair  . Abnormal quad screen  . 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 Risk of preterm labor--+ FFN 9/6.  Marland Kitchen ROM (rupture of membranes), premature, with cerclage in place  . Right fetal pyelectasis    Subjective: States leaking a lot of fluid, clear to yellow, deniesvag bleeding, uc, with +FM, backache with lying in bed.  Pregnancy complications: SROM  Objective: BP 143/70  Pulse 91  Temp 98.4 F (36.9 C) (Oral)  Resp 20  Ht 5' 7.5" (1.715 m)  Wt 245 lb 9.6 oz (111.403 kg)  BMI 37.90 kg/m2  LMP 01/17/2012     Calm, no distress, HEENT WNL grossly, lungs clear bilaterally, AP RRR, abd soft nt,no masses, not tympanic bowel sounds active, abdomen nontender,  No fluid seen on pad now No edema to lower extremities Fhts 145 LTV min to mod with variables at times with rapid recovery uc none SVE:   Dilation: Closed (cerclage intact) Exam by:: Manfred Arch CNM  Labs: Lab Results  Component Value Date   WBC 10.1 08/18/2012   HGB 11.0* 08/18/2012   HCT 32.8* 08/18/2012   MCV 88.4 08/18/2012   PLT 271 08/18/2012  Calm, no distress, HEENT WNL grossly, lungs clear bilaterally, AP RRR, abd soft nt,no masses, not tympanic bowel sounds active, abdomen nontender,  No fluid seen on pad now No edema to lower extremities Fhts 145 LTV min to mod with variables at times with rapid recovery uc none  Assessment and Plan: has Polycystic ovarian syndrome; Galactorrhea on right side; Trichomonas contact, treated; Obesity; Abnormal facial hair; Abnormal quad screen; Cervical shortening complicating pregnancy--cerclage placed 07/01/12; Risk of preterm labor--+ FFN 9/6.; ROM (rupture of membranes), premature, with cerclage in place; and Right fetal  pyelectasis on her problem list. [redacted]w[redacted]d PPROM NICU consult pending, bed exercises by nursing, stool softner ordered, encouraged deep breathing hourly, report if frequent uc, bleeding, change if color of discharge, abdominal pain. Lavera Guise, CNM 08/19/2012, 8:34 AM     Pt seen Plan of care reviewed including need for Magnesium Sulfate for neuroprotection in labor Continue current management with ATB for 7 days Will request NICU consult

## 2012-08-19 NOTE — Consult Note (Signed)
Neonatology Consult Note: At the request of the patients obstetrician Dr. Normand Sloop I met with Ms. Kirkeby who is at  28 1 wks currently with preg complicated by  PPROM, cervical shortening s/p cerclage / 17 P, right fetal renal pyelectasis was also noted at 3.5 cm. Patient received betamethasone course on 9/11 and 9/12.  We discussed morbidity/mortality at this gestional age, delivery room resuscitation, including intubation and surfactant in DR.  Discussed mechanical ventilation and risk for chronic lung disease, risk for IVH with potential for motor / cognitive deficits, ROP, NEC, sepsis, as well as temperature instability and feeding immaturity.  Discussed NG / OG feeds, benefits of MBM in reducing incidence of NEC.   Discussed likely length of stay. Thank you for allowing [place] to participate in her care.  Please call with questions.  John Giovanni, DO  Neonatologist  Face to face time 20 min.

## 2012-08-19 NOTE — Progress Notes (Signed)
Ur chart review completed.  

## 2012-08-20 DIAGNOSIS — O3430 Maternal care for cervical incompetence, unspecified trimester: Secondary | ICD-10-CM

## 2012-08-20 LAB — CULTURE, BETA STREP (GROUP B ONLY)

## 2012-08-20 MED ORDER — AMOXICILLIN 500 MG PO CAPS
500.0000 mg | ORAL_CAPSULE | Freq: Three times a day (TID) | ORAL | Status: AC
  Administered 2012-08-20 – 2012-08-25 (×14): 500 mg via ORAL
  Filled 2012-08-20 (×15): qty 1

## 2012-08-20 MED ORDER — FOLIC ACID 1 MG PO TABS
4.0000 mg | ORAL_TABLET | Freq: Every day | ORAL | Status: DC
  Filled 2012-08-20: qty 4

## 2012-08-20 NOTE — Progress Notes (Addendum)
Patient ID: Sarah Sutton, female   DOB: 1991-01-28, 21 y.o.   MRN: [id number] Sarah Sutton is a 21 y.o. G1P0000 at [redacted]w[redacted]d by LMP admitted for PROM after cervical cerclage  Subjective: GI: negative GU: Denies: dysuria, frequency/urgency, hematuria, vaginal bleeding OB: good movement        Objective: BP 129/62  Pulse 100  Temp 98.4 F (36.9 C) (Oral)  Resp 20  Ht 5' 7.5" (1.715 m)  Wt 245 lb 9.6 oz (111.403 kg)  BMI 37.90 kg/m2  LMP 01/17/2012      FHT:  FHR: 140s bpm, variability: moderate,  accelerations:  Present,  decelerations:  Present intermittent variables UC:   none SVE:   Dilation: Closed (cerclage intact) Exam by:: Manfred Arch CNM  Labs: Lab Results  Component Value Date   WBC 10.1 08/18/2012   HGB 11.0* 08/18/2012   HCT 32.8* 08/18/2012   MCV 88.4 08/18/2012   PLT 271 08/18/2012  GBS neg GC/CHL NEG  Assessment / Plan: PPROM without evidence of infection and no labor Cerclage in place Pt desires rest from monitoring Fetal Wellbeing:  Category II  Plan Continue antibiotics and bedrest Rest from monitor periodically   Sarah Sutton P 08/20/2012, 12:02 PM

## 2012-08-21 NOTE — Progress Notes (Signed)
Patient ID: Sarah Sutton, female   DOB: October 17, 1991, 21 y.o.   MRN: [id number] Sarah Sutton is a 22 y.o. G1P0000 at [redacted]w[redacted]d admitted for PPROM s/p rescue cerclage several weeks ago.   Subjective: Pt reports occas cramping.  Still leaking and fluid is clear.  No vag bleeding but notices the discharge may be slightly more mucousy.  Objective: BP 140/71  Pulse 98  Temp 98.1 F (36.7 C) (Oral)  Resp 20  Ht 5' 7.5" (1.715 m)  Wt 245 lb 9.6 oz (111.403 kg)  BMI 37.90 kg/m2  LMP 01/17/2012      Physical Exam:  Gen: well appearing Chest/Lungs: cta bilaterally  Heart/Pulse: RRR  Abdomen: soft, gravid, nontender, BX x4 quad Uterine fundus: soft, nontender Skin & Color: warm and dry  EXT: negative Homan's b/l, edema tr  FHT:  Cat 2 with occas decels and occas accels UC:   none SVE:   Dilation: Closed (cerclage intact) Exam by:: V Latham CNM VE deferred  Labs: Lab Results  Component Value Date   WBC 10.1 08/18/2012   HGB 11.0* 08/18/2012   HCT 32.8* 08/18/2012   MCV 88.4 08/18/2012   PLT 271 08/18/2012    Assessment and Plan: P0 at 28 3/7 wks admitted with PPROM.  Currently stable.  No s/sxs of chorio.  Fetal status is overall reassuring.  Cont observation.   Purcell Nails 08/21/2012, 3:41 PM

## 2012-08-22 LAB — CBC
MCH: 29.9 pg (ref 26.0–34.0)
MCHC: 33.9 g/dL (ref 30.0–36.0)
Platelets: 229 10*3/uL (ref 150–400)
RBC: 3.55 MIL/uL — ABNORMAL LOW (ref 3.87–5.11)
RDW: 13.4 % (ref 11.5–15.5)

## 2012-08-22 LAB — COMPREHENSIVE METABOLIC PANEL
Albumin: 2.3 g/dL — ABNORMAL LOW (ref 3.5–5.2)
BUN: 4 mg/dL — ABNORMAL LOW (ref 6–23)
Calcium: 8.9 mg/dL (ref 8.4–10.5)
Creatinine, Ser: 0.39 mg/dL — ABNORMAL LOW (ref 0.50–1.10)
GFR calc Af Amer: >90 mL/min (ref >90)
Glucose, Bld: 97 mg/dL (ref 70–99)
Potassium: 3.4 mEq/L — ABNORMAL LOW (ref 3.5–5.1)
Total Protein: 5.6 g/dL — ABNORMAL LOW (ref 6.0–8.3)

## 2012-08-22 LAB — URIC ACID: Uric Acid, Serum: 3 mg/dL (ref 2.4–7.0)

## 2012-08-22 LAB — URINALYSIS, ROUTINE W REFLEX MICROSCOPIC
Bilirubin Urine: NEGATIVE
Hgb urine dipstick: NEGATIVE
Ketones, ur: NEGATIVE mg/dL
Protein, ur: NEGATIVE mg/dL
Urobilinogen, UA: 0.2 mg/dL (ref 0.0–1.0)

## 2012-08-22 NOTE — Progress Notes (Signed)
21 y.o. year old female,at [redacted]w[redacted]d gestation. She has ROM with a cerclage in place.  SUBJECTIVE:  She denies fever and chills. She is stressed by being in the hospital. She denies headaches, blurred vision, and RUQ pain.   OBJECTIVE:  BP 135/69  Pulse 90  Temp 97.9 F (36.6 C) (Oral)  Resp 18  Ht 5' 7.5" (1.715 m)  Wt 245 lb 9.6 oz (111.403 kg)  BMI 37.90 kg/m2  LMP 01/17/2012  Fetal Heart Tones:  Cat 1  Contractions:          few  Chest: clear Heart: RRR Abd: Gravid, nontender Reflexes: Normal, no clonus  ASSESSMENT:  [redacted]w[redacted]d Weeks Pregnancy  SROM   Cerclage in place  Slight elevation in BP: doubt Preeclampsia  Reassuring fetal tracing  PLAN:  Use large cuff for BP's. Check PIH labs and cath UA.  Intermittent fetal monitoring.  17 P tomorrow (will bring from our office).  Continue to observe pregnancy.  Leonard Schwartz, M.D.

## 2012-08-22 NOTE — Progress Notes (Signed)
UR Chart review completed.  

## 2012-08-23 ENCOUNTER — Other Ambulatory Visit: Payer: Medicaid Other

## 2012-08-23 ENCOUNTER — Inpatient Hospital Stay (HOSPITAL_COMMUNITY): Payer: Medicaid Other

## 2012-08-23 ENCOUNTER — Inpatient Hospital Stay (HOSPITAL_COMMUNITY)
Admit: 2012-08-23 | Discharge: 2012-08-23 | Disposition: A | Discharge status: Home / Self Care | Payer: Medicaid Other | Attending: Obstetrics and Gynecology | Admitting: Obstetrics and Gynecology

## 2012-08-23 MED ORDER — SODIUM CHLORIDE 0.9 % IJ SOLN
3.0000 mL | Freq: Two times a day (BID) | INTRAMUSCULAR | Status: DC
  Administered 2012-08-23 – 2012-08-31 (×15): 3 mL via INTRAVENOUS

## 2012-08-23 MED ORDER — HYDROXYPROGESTERONE CAPROATE 250 MG/ML IM OIL
250.0000 mg | TOPICAL_OIL | INTRAMUSCULAR | Status: DC
  Administered 2012-08-23 – 2012-09-20 (×5): 250 mg via INTRAMUSCULAR
  Filled 2012-08-23 (×4): qty 1

## 2012-08-23 MED ORDER — POTASSIUM CHLORIDE CRYS ER 20 MEQ PO TBCR
20.0000 meq | EXTENDED_RELEASE_TABLET | Freq: Every day | ORAL | Status: DC
  Administered 2012-08-23 – 2012-08-24 (×2): 20 meq via ORAL
  Filled 2012-08-23 (×4): qty 1

## 2012-08-23 NOTE — Progress Notes (Signed)
08/23/12 1500  Clinical Encounter Type  Visited With Patient;Health care provider Rinaldo Cloud, RN)  Visit Type Initial;Spiritual support;Social support  Referral From Nurse (Nurse Tech)  Recommendations Spiritual Care will follow for support and encouragement.  Spiritual Encounters  Spiritual Needs Emotional  Stress Factors  Patient Stress Factors (Stressed--ante stay, expectations, parental identity, soc hx)    Had a powerful visit with Ms Fellenz, who was feeling discouraged, anxious, and considering leaving AMA when I arrived.  We talked about her feelings without judgment, with affirmation from bedside RN Pam about typical feelings and struggles for pts on ante, and with encouragement.  Feeling seen, heard, and affirmed was helpful in transforming her mood and helping her feel more optimistic and empowered.  We talked also about her relationship with her own mom, her childhood history of foster care, her longing for healing with her mom, her ambiguous feelings and self-doubt about her own mothering skills, how strong and insightful she is, and the opportunities for healing and transformation that are beginning to open.  By the time I left, she said, "I feel like you were one of God's angels who came to help me."  Very positive, enriching visit and lovely rapport.  Spiritual Care will continue to follow for spiritual/emotional support, encouragement, and affirmation.  Please page as needed, also, for further support:  [phone].  [address] [place], [place] [phone]

## 2012-08-23 NOTE — Progress Notes (Signed)
09/27 @ 1450- Pt returning from MFM to room.  Pt upset that she is not going to be delivered.  Allowed pt to vent her feelings but pt refusing to be put on the monitor at this time stating that she "needs some time".  Pt informed of the risks associated with not being able to monitor fetal well being.  Pt verlbalizes understanding stating that Dr. Sherrie George had reviewed those risks as well, possible fetal death.  1530- Pt still refusing to have fetal monitoring at this time. Pt stating that she "may have to leave the hospital that I just couldn't take it anymore" Pt informed again to make sure that she was aware that we were unable to evaluate fetal well being while not being monitored, that if she chose to leave Against Medical Advice that she would have to sign a paper stating that she took all the responsibility of the outcome, fetal death and maternal sepsis included. After talking with the pt a while and the chaplain come in to talk to the pt as well, pt stating that she was OK.  She was just feeling a little overwhelmed and depressed.  Pt stating that she just feels so tied down when she is "hooked up to the monitor".  Allowed pt to vent her feelings.  Pt states that she is feeling better.   Pt also informed at this time that if she were to start having any pain, VB, gushes of fluid or decreased FM that her nurse would need to evaluate any of those symptoms and the pt agrees to tell the RN if she starts having any of those things.  1555- Dr. Pennie Rushing calling and stating that she has talked to Dr. Sherrie George and wants social work to see her and talk to her about depression and bonding with the baby.

## 2012-08-23 NOTE — Consult Note (Signed)
MFM Note  Sarah Sutton is a 21 year old G1 AA female at 28+4 weeks who was admitted about 5 days ago due to spontaneous rupture of membranes. At 18 weeks she had cervical change which prompted the initiation of 17P injections. Two weeks later the cervical length was "shorter" and a decision was made to place a cerclage. A FFN performed at 26 weeks was positive and she received a course of BMZ. Then last Sunday she felt a "pop and gush" and ruptured membranes was diagnosed.   Since admission she has received antibiotics and her weekly 17P injection. She has shown no signs or symptoms of intrauterine infection. The FHTs have been reassuring except for an occasional variable decleleration of which some have been prolonged. [place] today revealed cephalic presentation, low normal AFV, unilateral pyelectasis and a BPP of 8/8.  I had a long talk with [name] regarding the recommendations for PPROM. She wants to have the baby now and/or go home. We discussed that both requests were not in the best interest of her fetus. She is having a difficult time bonding with the baby and therefore having a hard time tolerate hospitalization. In particular, continuous monitoring is very bothersome to her. She understands that she can leave the hospital AMA.   Assessment: 1) IUP at 28+4 weeks 2) S/P cerclage placement for cervical shortening 3) PPROM x 5 days - no evidence of IUI   Recommendations: 1) Would monitor fetus three times a day for at least 2 hours 2) Growth [place] 3-4 weeks from previous one 3) BPPs twice weekly or if tracing is concerning 4) Deliver by 34 weeks 5) Notify NB providers of pyelectasis 6) If she delivers before 31 weeks, re bolus with magnesium sulfate for neuropx 7) See if Social Work can help  (Face-to-face consultation with patient: 30 min)

## 2012-08-23 NOTE — Progress Notes (Signed)
Patient ID: Sarah Sutton, female   DOB: 12/25/1990, 21 y.o.   MRN: [id number] Sarah Sutton is a 21 y.o. G1P0000 at [redacted]w[redacted]d by LMP admitted for rupture of membranes after cerclage placement for cervical shortening  Subjective: GI: Denies nausea, vomiting or diarrhea. GU: Denies: dysuria, frequency/urgency, vaginal bleeding OB: good fetal movement        Objective: BP 138/76  Pulse 91  Temp 97.9 F (36.6 C) (Oral)  Resp 20  Ht 5' 7.5" (1.715 m)  Wt 245 lb 9.6 oz (111.403 kg)  BMI 37.90 kg/m2  LMP 01/17/2012      FHT:  FHR: 140s bpm, variability: moderate,  accelerations:  Present,  decelerations:  Present this am with one 4 minute variable deceleration to the 100's UC:   none SVE:   Dilation: Closed (cerclage intact) Exam by:: Manfred Arch CNM  Labs: Lab Results  Component Value Date   WBC 9.3 08/22/2012   HGB 10.6* 08/22/2012   HCT 31.3* 08/22/2012   MCV 88.2 08/22/2012   PLT 229 08/22/2012    Assessment / Plan: PPROM No evidence of infection Fetal Wellbeing:  Category II BPP 8/8 per discussion with MFM  PLAN: 1)MFM consultation completed and discussed with perinatologist prior to consultation note completion.  Recommendation for  A) monitoring daily 2hours every 8 hours as long as the tracing shows reactivity                  B) twice weekly BPP as a baseline                  C) delivery at 34 weeks if no obstetrical reason for delivery prior to that                  D) growth u/s every 3-4 weeks in light of increased DSR with normal Harmony testing 2)pt discussed very openly her ambivalence about staying hospitalized for a prolonged stay.  She agreed for the moment to address those feelings one day at a time and to remain hospitalized 3) will offer counseling while in hospital to address concerns in #2) 4) complete antibiotic course. 5) 17P today and weekly.   Sarah Sutton P 08/23/2012, 4:59 PM

## 2012-08-24 NOTE — Clinical Social Work Note (Signed)
Clinical Social Work Department ANTENATAL PSYCHOSOCIAL ASSESSMENT 08/24/2012  Patient:  Sarah Sutton, Sarah Sutton   Account Number:  [id number]  Admit Date:  08/18/2012     DOB:  1991/05/10   Age:  21 Gestational age on admission:  28     Expected delivery date:  10/01/2012 Admitting diagnosis:   IUP at 28 weeks, PPROM    Clinical Social Worker:  Truman Hayward,  [place]  Date/Time:  08/24/2012 12:00 N  FAMILY/HOME ENVIRONMENT  Home address:   [address]  [place], [place] [address]   Household Member/Support Name Relationship Age  Greg Cutter SIGNIFICANT OTHER    Other support:   PT reports lots of family in the area that are good supports.  She states her siblings, aunts, uncles, etc have been to see her here in the hospital.     PSYCHOSOCIAL DATA  Information source:  Patient Interview Other information source:   RN and chart review    Resources:   Pt reports having crib, bassinet, and other items prepared for infant.  She is unsure whether she will have a baby shower still, however she has no concerns at this time for any supplies she is unable to get for her infant.   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  N    Cultural/Environmental issues impacting care:   N/A    STRENGTHS / WEAKNESSES / FACTORS TO CONSIDER  Concerns related to hospitalization:   Pt reports having some depression due to her having to stay in the hospital until her delivery.  CSW discussed symptoms, and pt reports low mood daily due to her unable to be at home, however she does not feel she needs counseling or medications management.  Pt also brought up concerns that she has not been made fully aware of her treatment and why she has to take all the medications her nurse in giving her.   Previous pregnancies/feelings towards pregnancy?  Concerns related to being/becoming a mother?   Pt has not  had any children previously.  She reports being excited about having the infant and having support from [name] and her family, she is just in a low mood due to having to stay in the hospital for the estimated length of time.  She does not report any concerns or worries about becoming a mother.   Social support ([name]? Who is/will be helping with baby/other kids)   Pt reports both [name] and herself have good family support and they have been visiting her here at the hospital and will help with child care after birth.   Couples relationship:   Pt reports being in a relationship for the last 6 years with [name].  They currently live together, and it is just the two of them in the home.  Pt reports he is existed about becoming a F as well. No hx of DV or any safety concerns reported by pt.   Recent stressful life events (life changes in past year?):   none reported by pt.   Prenatal care/education/home preparations?   No concerns with prenatal care and pt has been compliant. She reports being on bedrest for quite some time before now just recently being admitted to the hospital.  No concerns about education or home prep at this time.   Domestic violence (of any type):  N If yes to domestic violence describe/action plan:  Substance use during pregnancy.  (If YES, complete SBIRT):  N  Complete PHQ-9 (Depression Screening) on all antenatal patients.  PHQ-9 score:   10 (IF SCORE => 15 complete TREAT)  Follow up recommendations:   continue to provide support while pt is here in hospital.   Patient advised/response?   Pt was instructed to let SW know if any concerns arise or if she needs to speak with someone about depression symptoms.  Pt's response was that she will, however she felt frustrated that it was made such a big concern that they sent SW in to speak with her.  SW discussed her daily low mood with being in the hospital and that it could increase and to let someone know if this happened, and pt  agreed.  There are no current concerns with SI/HI at this time.   Other:    Clinical Assessment/Plan CSW spoke with pt at length about hospitalization.  Pt reports being frustrated with having to stay here in the hospital until delivery and this has her in a low mood daily.  She does not report any other severe symptoms.  She reports good family support and a good relationship with [name].  No hx of drug use or other concerns seen in pt chart review or from discussion with nurse. CSW instructed pt to request to see SW if her symptoms become unmanageable. Pt agreed, and stated she did not want counseling or medication management.  CSW will continue to be available to provide support for pt while she's here in the hospital.

## 2012-08-24 NOTE — Progress Notes (Signed)
Patient ID: Sarah Sutton, female   DOB: [birth year], 21 y.o.   MRN: [id number] Sarah Sutton is a 21 y.o. G1P0000 at [redacted]w[redacted]d by LMP admitted for PROM.  Status post placement of cervical cerclage  Subjective: GI: negative GU: Denies: dysuria, frequency/urgency, hematuria, vaginal bleeding, pelvic pain OB: The patient notes fetal movement. She continues to express displeasure at being hospitalized and is verbal about how disconcerting it is for her. She notes that she is less optimistic about her ability to continue hospitalization today than she was yesterday. She asked about whether she could request removal of her cerclage.        Objective: BP 131/68  Pulse 108  Temp 98.2 F (36.8 C) (Oral)  Resp 20  Ht 5' 7.5" (1.715 m)  Wt 245 lb 9.6 oz (111.403 kg)  BMI 37.90 kg/m2  LMP 01/17/2012      FHT:  FHR: 140s bpm, variability: minimal ,  accelerations:  Present,  decelerations:  Absent UC:   none SVE:   Dilation: Closed (cerclage intact) Exam by:: Manfred Arch CNM  Labs: Lab Results  Component Value Date   WBC 9.3 08/22/2012   HGB 10.6* 08/22/2012   HCT 31.3* 08/22/2012   MCV 88.2 08/22/2012   PLT 229 08/22/2012   Results for orders placed during the hospital encounter of 08/18/12 (from the past 72 hour(s))  COMPREHENSIVE METABOLIC PANEL     Status: Abnormal   Collection Time   08/22/12  8:40 AM      Component Value Range Comment   Sodium 136  135 - 145 mEq/L    Potassium 3.4 (*) 3.5 - 5.1 mEq/L    Chloride 102  96 - 112 mEq/L    CO2 25  19 - 32 mEq/L    Glucose, Bld 97  70 - 99 mg/dL    BUN 4 (*) 6 - 23 mg/dL    Creatinine, Ser 4.09 (*) 0.50 - 1.10 mg/dL    Calcium 8.9  8.4 - 81.1 mg/dL    Total Protein 5.6 (*) 6.0 - 8.3 g/dL    Albumin 2.3 (*) 3.5 - 5.2 g/dL    AST 12  0 - 37 U/L    ALT 8  0 - 35 U/L    Alkaline Phosphatase 63  39 - 117 U/L    Total Bilirubin 0.2 (*) 0.3 - 1.2 mg/dL    GFR calc non Af Amer >90  >90 mL/min    GFR calc Af Amer >90  >90 mL/min   CBC     Status:  Abnormal   Collection Time   08/22/12  8:40 AM      Component Value Range Comment   WBC 9.3  4.0 - 10.5 K/uL    RBC 3.55 (*) 3.87 - 5.11 MIL/uL    Hemoglobin 10.6 (*) 12.0 - 15.0 g/dL    HCT 91.4 (*) 78.2 - 46.0 %    MCV 88.2  78.0 - 100.0 fL    MCH 29.9  26.0 - 34.0 pg    MCHC 33.9  30.0 - 36.0 g/dL    RDW 95.6  21.3 - 08.6 %    Platelets 229  150 - 400 K/uL   URIC ACID     Status: Normal   Collection Time   08/22/12  8:40 AM      Component Value Range Comment   Uric Acid, Serum 3.0  2.4 - 7.0 mg/dL   LACTATE DEHYDROGENASE     Status: Abnormal   Collection  Time   08/22/12  8:40 AM      Component Value Range Comment   LDH 74 (*) 94 - 250 U/L   URINALYSIS, ROUTINE W REFLEX MICROSCOPIC     Status: Abnormal   Collection Time   08/22/12 10:50 AM      Component Value Range Comment   Color, Urine YELLOW  YELLOW    APPearance CLEAR  CLEAR    Specific Gravity, Urine 1.020  1.005 - 1.030    pH 7.5  5.0 - 8.0    Glucose, UA NEGATIVE  NEGATIVE mg/dL    Hgb urine dipstick NEGATIVE  NEGATIVE    Bilirubin Urine NEGATIVE  NEGATIVE    Ketones, ur NEGATIVE  NEGATIVE mg/dL    Protein, ur NEGATIVE  NEGATIVE mg/dL    Urobilinogen, UA 0.2  0.0 - 1.0 mg/dL    Nitrite NEGATIVE  NEGATIVE    Leukocytes, UA TRACE (*) NEGATIVE   URINE MICROSCOPIC-ADD ON     Status: Abnormal   Collection Time   08/22/12 10:50 AM      Component Value Range Comment   Squamous Epithelial / LPF MANY (*) RARE    WBC, UA 3-6  <3 WBC/hpf    Assessment / Plan: Preterm premature rupture of membrane Fetal Wellbeing:  Category I S/p cervical cerclage Significant emotional difficulty with hospitalization Hypokalemia  Plan: I spent 45 minutes discussing the patient's concern and involved in active listening I reviewed each of her medications one by one at her request and explained the diagnosis for which they were used. She acknowleged there were no other interventions that would make her more comfortable. Complete  antibiotic course. I explained that I do not recommend cerclage removal in the absence of evidence of infection or fetal stress Recheck BMP in am    Sohail Capraro P 08/24/2012, 9:23 PM

## 2012-08-25 LAB — COMPREHENSIVE METABOLIC PANEL
BUN: 6 mg/dL (ref 6–23)
CO2: 25 mEq/L (ref 19–32)
Chloride: 99 mEq/L (ref 96–112)
Creatinine, Ser: 0.48 mg/dL — ABNORMAL LOW (ref 0.50–1.10)
GFR calc non Af Amer: >90 mL/min (ref >90)
Total Bilirubin: 0.1 mg/dL — ABNORMAL LOW (ref 0.3–1.2)

## 2012-08-25 NOTE — Progress Notes (Signed)
Pt called saying she has to leave and wants the paper she has to sign in order to do that. Dr. Pennie Rushing notified and says to give her the AMA to sign. 1108- pt signed AMA form. Saline lock dc'd.

## 2012-08-25 NOTE — Progress Notes (Signed)
Patient ID: Sarah Sutton, female   DOB: 1991-04-06, 21 y.o.   MRN: [id number] Sarah Sutton is a 22 y.o. G1P0000 at [redacted]w[redacted]d by LMP admitted for PROM  Subjective: GI: negative GU: negative OB: good fetal movement        Objective: BP 113/54  Pulse 91  Temp 98.2 F (36.8 C) (Oral)  Resp 20  Ht 5' 7.5" (1.715 m)  Wt 245 lb 9.6 oz (111.403 kg)  BMI 37.90 kg/m2  LMP 01/17/2012      FHT:  FHR: 140's bpm, variability: minimal ,  accelerations:  Present,  decelerations:  Present nonrepetitive  intermittent variables UC:   none SVE:   Dilation: Closed (cerclage intact) Exam by:: Manfred Arch CNM  Labs: Lab Results  Component Value Date   WBC 9.3 08/22/2012   HGB 10.6* 08/22/2012   HCT 31.3* 08/22/2012   MCV 88.2 08/22/2012   PLT 229 08/22/2012   Results for orders placed during the hospital encounter of 08/18/12 (from the past 24 hour(s))  COMPREHENSIVE METABOLIC PANEL     Status: Abnormal   Collection Time   08/25/12  8:17 AM      Component Value Range   Sodium 133 (*) 135 - 145 mEq/L   Potassium 4.0  3.5 - 5.1 mEq/L   Chloride 99  96 - 112 mEq/L   CO2 25  19 - 32 mEq/L   Glucose, Bld 96  70 - 99 mg/dL   BUN 6  6 - 23 mg/dL   Creatinine, Ser 4.09 (*) 0.50 - 1.10 mg/dL   Calcium 9.7  8.4 - 81.1 mg/dL   Total Protein 6.1  6.0 - 8.3 g/dL   Albumin 2.5 (*) 3.5 - 5.2 g/dL   AST 14  0 - 37 U/L   ALT 9  0 - 35 U/L   Alkaline Phosphatase 70  39 - 117 U/L   Total Bilirubin 0.1 (*) 0.3 - 1.2 mg/dL   GFR calc non Af Amer >90  >90 mL/min   GFR calc Af Amer >90  >90 mL/min    Assessment / Plan: PPROM at 29+ wks s/p cerclage  Fetal Wellbeing:  Category I Pt asked for paperwork to sign out AMA earlier today because of her continued difficulty with remaining hospitalized.  She subsequently thought about it and decided to stay.    Plan: Continue current care Antibiotic regimen complete Potassium normalized, so will D/c oral potassium Needs second glucola at 30 wks Needs  BPP x2 next week  (ordered) 4 wk growth [place] due week of 09/15/12    HAYGOOD,VANESSA P 08/25/2012, 7:32 PM

## 2012-08-25 NOTE — Progress Notes (Signed)
House coverage supervisor, Marney Setting RN notified.

## 2012-08-25 NOTE — Progress Notes (Signed)
Pt has decided that she wants to stay. Dr. Pennie Rushing notified.

## 2012-08-26 NOTE — Progress Notes (Signed)
Ur chart review completed.  

## 2012-08-26 NOTE — Progress Notes (Signed)
[redacted] weeks gestation, with PROM, cerclage.  Height  67.5' Weight 245 Lbs pre-pregnancy weight 223 Lbs.Pre-pregnancy  BMI 34.5  IBW 137 Lbs  Total weight gain 21 Lbs. Weight gain goals 11-20 Lbs.   Estimated needs: 21-2300 kcal/day, 80-90 grams protein/day, 2.4 liters fluid/day regular diet tolerated well, appetite good. Current diet prescription will provide for increased needs.Pt happy with food selections, does not require snacks No abnormal nutrition related labs  Nutrition Dx: Increased nutrient needs r/t pregnancy and fetal growth requirements aeb [redacted] weeks gestation.  No educational needs assessed at this time.  Elisabeth Cara M.Odis Luster LDN Neonatal Nutrition Support Specialist Pager [phone]

## 2012-08-26 NOTE — Progress Notes (Signed)
Declines EFM for now, desires monitoring around 1930-2030.

## 2012-08-26 NOTE — Progress Notes (Signed)
21 y.o. year old female,at [redacted]w[redacted]d gestation.  SUBJECTIVE:  Doing better. No fever or chills. Very bored.  OBJECTIVE:  BP 130/71  Pulse 97  Temp 98.2 F (36.8 C) (Oral)  Resp 20  Ht 5' 7.5" (1.715 m)  Wt 245 lb 9.6 oz (111.403 kg)  BMI 37.90 kg/m2  LMP 01/17/2012  Fetal Heart Tones:  Cat 1  Contractions:          few  Abd: nontender, gravid  ASSESSMENT:  [redacted]w[redacted]d Weeks Pregnancy  PROM  No labor. Cerclage in place.  Stable fetus  PLAN:  Continue in-hospital care.  Leonard Schwartz, M.D.

## 2012-08-27 ENCOUNTER — Inpatient Hospital Stay (HOSPITAL_COMMUNITY): Payer: Medicaid Other

## 2012-08-27 ENCOUNTER — Other Ambulatory Visit (HOSPITAL_COMMUNITY): Payer: Medicaid Other

## 2012-08-27 DIAGNOSIS — O4290 Premature rupture of membranes, unspecified as to length of time between rupture and onset of labor, unspecified weeks of gestation: Secondary | ICD-10-CM

## 2012-08-27 DIAGNOSIS — O3430 Maternal care for cervical incompetence, unspecified trimester: Secondary | ICD-10-CM

## 2012-08-27 MED ORDER — PANTOPRAZOLE SODIUM 40 MG PO TBEC
40.0000 mg | DELAYED_RELEASE_TABLET | Freq: Every day | ORAL | Status: DC
  Administered 2012-08-28 – 2012-08-29 (×2): 40 mg via ORAL
  Filled 2012-08-27 (×9): qty 1

## 2012-08-27 NOTE — Progress Notes (Signed)
Sarah Sutton  was seen today for an ultrasound appointment.  See full report in [facility].  Alpha Gula, MD  IUP at 29 1/7 weeks PROM, oligohydramnios Right sided renal pylectasis noted measuring 8 mm BPP 6/8 (-2 for oligohydramnios)  Continue inpatient management. 2x weekly BPPs.

## 2012-08-27 NOTE — Progress Notes (Signed)
08/27/12 1300  Clinical Encounter Type  Visited With Patient  Visit Type Follow-up;Spiritual support;Social support  Spiritual Encounters  Spiritual Needs Emotional  Stress Factors  Patient Stress Factors Loss of control (Challenging to feel cooped up in hospital on bedrest)    Sarah Sutton was feeling steady and upbeat today during this follow-up visit.  She shared more about her frustration with feeling stuck here at the hospital and about the things that are helping her cope with a long stay, including sending her car home so that it wouldn't be tempting in the parking lot.   Provided opportunity for her to share and process her story, to celebrate her milestones, and to strategize about coping tools to keep her motivated and positive.  Sarah Sutton is very grateful for pastoral support and is making good use of this spiritual care resource.  Will continue to follow for spiritual and emotional support.  [address] [place], [place] [phone]

## 2012-08-27 NOTE — Progress Notes (Signed)
Patient ID: Sarah Sutton, female   DOB: 12-04-1990, 21 y.o.   MRN: [id number] Pt is [redacted]w[redacted]d w PPROM, cerclage in place  Pt  notified RN when she returned from [facility] ride that she was having pain/pressure in the R side of  her chest.  She denies SOB and has no other pain She states it feels like she needs to stretch. She denies any pain or swelling in her legs She states she has had similar pain during her pregnancy, but it usually goes away over time without having to do anything different  O: VSS, 02sat 99-100% FHR cat 1   A: 29wks  ? Indigestion ? Referred pain from continued bedrest  P: I offered pt to try protonix, she declined She also declined tylenol or motrin She also declined chest xray She stated that if it worsens or doesn't go away she would consider further testing CTO

## 2012-08-27 NOTE — Progress Notes (Addendum)
Hospital day # 9 pregnancy at [redacted]w[redacted]d  S: well, reports good fetal activity      Contractions:none      Vaginal bleeding:none now       Vaginal discharge: watery and no significant change  O: BP 135/67  Pulse 101  Temp 98.6 F (37 C) (Oral)  Resp 18  Ht 5' 7.5" (1.715 m)  Wt 245 lb 9.6 oz (111.403 kg)  BMI 37.90 kg/m2  SpO2 99%  LMP 01/17/2012      Fetal tracings:reviewed and reassuring      Uterus gravid, consistent with 29 weeks and non-tender      Extremities: extremities normal, atraumatic, no cyanosis or edema, edema +1, Homans sign is negative, no sign of DVT and no significant edema and no signs of DVT  BPP today 6/8 with "2" off for fluid volume.  A: [redacted]w[redacted]d with PPROM and Cerclage in place. Management in collaboration with MFM. Daily BPP.     Unchanged and remains afebrile.  P: continue current plan of care.     Have planned a visit to NICU so that mother to be can familiarize herself with the environment and see first hand     Preterm Babies and the management  of prematurity.  DAVIES, DENISE , CNM. 08/27/2012 9:44 AM   Seen and agreed BPP today 6/8 Long discussion with pt and sister present about pt wanting cerclage removed and to be delivered or to be discharged home. Again reviewed the recommended plan of care and the availability to sign AMA. Risks and benefits of all options thoroughly reviewed. Pt states she doesn't want the responsibility of the outcome to fall on her shoulders. Offered counseling and declined. No change in plan at this time.  45 minutes conversation Will add Protonix to help with GERD

## 2012-08-28 NOTE — Progress Notes (Addendum)
Sarah Sutton is a 21 y.o. G1P0000 at [redacted]w[redacted]d Reason with Cerclage, SROM hospital day 10 Patient Active Problem List  Diagnosis  . Polycystic ovarian syndrome  . Galactorrhea on right side  . Trichomonas contact, treated  . Obesity  . Abnormal facial hair  . Abnormal quad screen  . 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 Risk of preterm labor--+ FFN 9/6.  Marland Kitchen ROM (rupture of membranes), premature, with cerclage in place  . Right fetal pyelectasis    Subjective: denies vag bleeding, uc, with +FM  Pregnancy complications: cerclage, srom  Objective: BP 127/64  Pulse 87  Temp 98.3 F (36.8 C) (Oral)  Resp 18  Ht 5' 7.5" (1.715 m)  Wt 245 lb 9.6 oz (111.403 kg)  BMI 37.90 kg/m2  SpO2 99%  LMP 01/17/2012      Physical Exam:  Gen: calm, quiet, no distress Chest/Lungs: cta bilaterally  Heart/Pulse: RRR  Abdomen: soft, gravid, nontender, BX x4 quad Uterine fundus: soft, nontender Skin & Color: warm and dry  EXT: negative Homan's b/l, edema none  FHT:  FHR: 140 bpm, variability: moderate,  accelerations:  Present,  decelerations:  Present  variable decel to 60s x 1 UC:   none SVE:   Dilation: Closed (cerclage intact) Exam by:: Manfred Arch CNM  Labs: Lab Results  Component Value Date   WBC 9.3 08/22/2012   HGB 10.6* 08/22/2012   HCT 31.3* 08/22/2012   MCV 88.2 08/22/2012   PLT 229 08/22/2012    Assessment and Plan: has Polycystic ovarian syndrome; Galactorrhea on right side; Trichomonas contact, treated; Obesity; Abnormal facial hair; Abnormal quad screen; Cervical shortening complicating pregnancy--cerclage placed 07/01/12; Risk of preterm labor--+ FFN 9/6.; ROM (rupture of membranes), premature, with cerclage in place; and Right fetal pyelectasis on her problem list. Continue care.  Lavera Guise, CNM 08/28/2012, 10:52 AM   Agree with above.  Pt currently on monitor continuously with overall reassuring FHT.

## 2012-08-29 NOTE — Progress Notes (Signed)
UR Chart review completed.  

## 2012-08-29 NOTE — Progress Notes (Signed)
Pt out for wheelchair ride.

## 2012-08-29 NOTE — Progress Notes (Signed)
LATE ENTRY PT SEEN AT APPROXIMATELY 3PM Patient ID: Sarah Sutton, female   DOB: 09-15-1991, 21 y.o.   MRN: [id number]  Sarah Sutton is a 21 y.o. G1P0000 at [redacted]w[redacted]d by LMP admitted for PROM with rescue cerclage in place  Subjective: GI: negative [id number] OB: Complains of intermittent cramping but not at all regular and not at all painful        Objective: BP 139/69  Pulse 92  Temp 98.2 F (36.8 C) (Oral)  Resp 18  Ht 5' 7.5" (1.715 m)  Wt 234 lb 3.2 oz (106.232 kg)  BMI 36.14 kg/m2  SpO2 99%  LMP 01/17/2012      FHT:  FHR: 140-150 bpm, variability: moderate,  accelerations:  Present,  decelerations:  No significant, occ variables UC:   None noted SVE:   Dilation: Closed (cerclage intact) Exam by:: Manfred Arch CNM  Labs: Lab Results  Component Value Date   WBC 9.3 08/22/2012   HGB 10.6* 08/22/2012   HCT 31.3* 08/22/2012   MCV 88.2 08/22/2012   PLT 229 08/22/2012    Assessment / Plan: Stable preterm premature rupture of membranes with cerclage in place Incompetent cervix stable  Fetal Wellbeing:  Category I  PLAN Continue current management Patient is to notify us in contractions become regular or painful for additional monitoring   Kassi Esteve P 08/29/2012, 4:17 PM

## 2012-08-30 ENCOUNTER — Encounter: Payer: Medicaid Other | Admitting: Obstetrics and Gynecology

## 2012-08-30 ENCOUNTER — Inpatient Hospital Stay (HOSPITAL_COMMUNITY): Payer: Medicaid Other

## 2012-08-30 ENCOUNTER — Other Ambulatory Visit: Payer: Medicaid Other

## 2012-08-30 NOTE — Progress Notes (Signed)
Sarah Sutton  was seen today for an ultrasound appointment.  See full report in [facility].  Alpha Gula, MD  IUP at 29 1/7 weeks PROM, subjectively decreased amniotic fluid volume Right sided renal pylectasis noted measuring 8 mm BPP 8/8  Continue inpatient management. 2x weekly BPPs.

## 2012-08-30 NOTE — Progress Notes (Signed)
21 y.o. year old female,at [redacted]w[redacted]d gestation. She has a history of incompetent cervix and there is a cerclage in place. She has preterm rupture membranes but has not labored.  SUBJECTIVE:  She denies complications today. Resting better.  OBJECTIVE:  BP 130/71  Pulse 88  Temp 97.9 F (36.6 C) (Oral)  Resp 20  Ht 5' 7.5" (1.715 m)  Wt 234 lb 3.2 oz (106.232 kg)  BMI 36.14 kg/m2  SpO2 99%  LMP 01/17/2012  Fetal Heart Tones:  Category 1  Contractions:          Few  Abdomen: Nontender  Ultrasound:  IUP at 29 1/7 weeks  PROM, subjectively decreased amniotic fluid volume  Right sided renal pylectasis noted measuring 8 mm  BPP 8/8  Maternal fetal medicine consult:  Continue hospital management. Twice weekly biophysical profiles.  ASSESSMENT:  [redacted]w[redacted]d Weeks Pregnancy  Preterm premature rupture membranes-stable  Incompetent cervix with a cerclage in place  Stable nonstress test  PLAN:  17 P today (brought from the office)  Continue hospital management  Leonard Schwartz, M.D.

## 2012-08-31 NOTE — Progress Notes (Signed)
Hospital day # 13 pregnancy at [redacted]w[redacted]d  S: well, reports good fetal activity      Contractions:none      Vaginal bleeding:none now       Vaginal discharge: no significant change  O: BP 134/79  Pulse 99  Temp 98.2 F (36.8 C) (Oral)  Resp 20  Ht 5' 7.5" (1.715 m)  Wt 234 lb 3.2 oz (106.232 kg)  BMI 36.14 kg/m2  SpO2 99%  LMP 01/17/2012      Fetal tracings:Fetal heart variability: moderate reviewed and reassuring      Uterus gravid and non-tender      Extremities: extremities normal, atraumatic, no cyanosis or edema and no significant edema and no signs of DVT  A: [redacted]w[redacted]d with PPROM and cerclage in situ.     unchanged  P: continue current plan of care  Carin Shipp , CNM. 08/31/2012 5:22 PM   2

## 2012-09-01 NOTE — Progress Notes (Addendum)
21 y.o. year old female,at [redacted]w[redacted]d gestation.  SUBJECTIVE:  Doing Well.  OBJECTIVE:  BP 133/71  Pulse 95  Temp 98.3 F (36.8 C) (Oral)  Resp 20  Ht 5' 7.5" (1.715 m)  Wt 234 lb 3.2 oz (106.232 kg)  BMI 36.14 kg/m2  SpO2 99%  LMP 01/17/2012  Fetal Heart Tones:  Cat 1  Contractions:          Few  Abd; Nontender  ASSESSMENT:  [redacted]w[redacted]d Weeks Pregnancy  PROM  Incomp Cervix- Cerclage intact  PLAN:  Continue hospital obs.  BPP twice weekly. Next 09/03/12.  Leonard Schwartz, M.D.

## 2012-09-02 LAB — GLUCOSE TOLERANCE, 1 HOUR: Glucose, 1 Hour GTT: 102 mg/dL (ref 70–140)

## 2012-09-02 NOTE — Progress Notes (Signed)
Ur chart review completed.  

## 2012-09-02 NOTE — Progress Notes (Signed)
Hospital day # 15 pregnancy at [redacted]w[redacted]d PPROM  S:  In a gray mood today. Again questioning if can request removal of cerclage and delivery because "I don't want to be here anymore". Again pt informed that she can sign AMA and be discharged home for outpatient management.       reports good fetal activity      Contractions:none      Vaginal bleeding:none now       Vaginal discharge: watery and no significant change  O: BP 136/71  Pulse 104  Temp 98.2 F (36.8 C) (Oral)  Resp 20  Ht 5' 7.5" (1.715 m)  Wt 234 lb 3.2 oz (106.232 kg)  BMI 36.14 kg/m2  SpO2 99%  LMP 01/17/2012      Fetal tracings:daily NST pending      Uterus gravid and non-tender      Extremities: no significant edema and no signs of DVT  A: [redacted]w[redacted]d with PPROM     unchanged  P: Pt refused Glucola today      Pt wants to meet with the Medicaid coordinator - will arrange for tomorrow      continue current plan of care  [facility] A  MD 09/02/2012 3:53 PM

## 2012-09-02 NOTE — Progress Notes (Signed)
Pt refusing vital signs or monitoring at this time. Informed pt to let nurse know when she is ready.

## 2012-09-03 ENCOUNTER — Inpatient Hospital Stay (HOSPITAL_COMMUNITY): Payer: Medicaid Other

## 2012-09-03 NOTE — Progress Notes (Signed)
Sarah Sutton  was seen today for an ultrasound appointment.  See full report in [facility].  Alpha Gula, MD  Single IUP at 30 1/7 weeks PPROM Mild right renal pylectasis (6 mm) without calyeceal dilation Fetal growth is appropriate (35th %tile) Subjectively decreased amniotic fluid volume BPP 8/10 (-2 for absent breathing movement)  Continue inpatient management. 2x weekly BPPs.

## 2012-09-03 NOTE — Progress Notes (Signed)
SW received call from case manager stating that patient wants to leave AMA again.  SW spoke to RN who states patient is sleeping at this time.  SW is aware that patient did not understand why weekend SW was called to meet with her (previously) and this SW asked RN to discuss with patient the option of a visit from SW at this time.  RN will call SW if patient would like to talk about her feelings/situation.

## 2012-09-03 NOTE — Progress Notes (Signed)
Patient ID: Sarah Sutton, female   DOB: October 24, 1991, 21 y.o.   MRN: [id number] Pt desires to go home.  She states she will do her glucola.  Denies having any   VB.  Good FM.  She continues to leak fluid  BP 133/72  Pulse 94  Temp 98.1 F (36.7 C) (Oral)  Resp 18  Ht 5' 7.5" (1.715 m)  Wt 234 lb 3.2 oz (106.232 kg)  BMI 36.14 kg/m2  SpO2 99%  LMP 01/17/2012  FHTS Baseline: 130 bpm, Variability: Good {> 6 bpm) and Accelerations: Reactive  Toco none  Pt in NAD CV RRR Lungs CTAB abd  Gravid soft and NT GU no vb EXt no calf tenderness Results for orders placed during the hospital encounter of 08/18/12 (from the past 72 hour(s))  GLUCOSE TOLERANCE, 1 HOUR     Status: Normal   Collection Time   09/02/12  8:00 AM      Component Value Range Comment   Glucose, 1 Hour GTT 102  70 - 140 mg/dL    [place] SIUP vtx, AFI 6.0, BPP8/10 -2 for beathing, EFW 35% Assessment and Plan [redacted]w[redacted]d  PPROM Pt is sad but does not meet all criteria for depression.  She just misses home.  She declined any treatment.  I shared different things like reading and journaling, which may help.   Pt did not do one hour correctly will repeat tomorrow She does not plan to leave AMA Fetal well being is good.  Will continue to monitor closely

## 2012-09-03 NOTE — Progress Notes (Signed)
Please d/c Ms. Jefcoat's protonix. Pt has refused the last several days. Thanks, Judeth Cornfield [facility]

## 2012-09-04 LAB — GLUCOSE TOLERANCE, 1 HOUR: Glucose, 1 Hour GTT: 119 mg/dL (ref 70–140)

## 2012-09-04 NOTE — Progress Notes (Signed)
Patient ID: Sarah Sutton, female   DOB: 04-10-1991, 21 y.o.   MRN: [id number] Sarah Sutton is a 21 y.o. G1P0000 at [redacted]w[redacted]d by LMP admitted for PROM with cervical cerclage in place for incompetent cervix  Subjective: GI: negative GU: Denies: dysuria, frequency/urgency, hematuria, pelvic pain OB: Good fetal movement        Objective: BP 127/78  Pulse 111  Temp 97.8 F (36.6 C) (Oral)  Resp 20  Ht 5' 7.5" (1.715 m)  Wt 234 lb 3.2 oz (106.232 kg)  BMI 36.14 kg/m2  SpO2 99%  LMP 01/17/2012     one-hour glucose tes=t 119  FHT:  FHR: 140-150 bpm, variability: minimal ,  accelerations:  Present,  decelerations:  Present Intermittent variables.   none are repetitive UC:   none SVE:   Dilation: Closed (cerclage intact) Exam by:: Manfred Arch CNM  Labs: Lab Results  Component Value Date   WBC 9.3 08/22/2012   HGB 10.6* 08/22/2012   HCT 31.3* 08/22/2012   MCV 88.2 08/22/2012   PLT 229 08/22/2012    Assessment / Plan: Preterm premature rupture of membranes at 30 weeks and 2 date Incompetent cervix Fetal Wellbeing:  Category II  Plan: Continue current regimen  [name] 09/04/2012, 3:06 PM

## 2012-09-05 NOTE — Progress Notes (Signed)
Pt requesting not to be bothered this afternoon after monitoring and vitals. Pt instructed to call out if she needs the RN for anything. Will inform the tech the pt wishes to rest uninterrupted at this time.

## 2012-09-05 NOTE — Progress Notes (Signed)
UR Chart review completed.  

## 2012-09-05 NOTE — Progress Notes (Signed)
21 y.o. year old female,at [redacted]w[redacted]d gestation.  The patient has had preterm and premature rupture membranes. She has a history of incompetent cervix and she has a cerclage in place.  SUBJECTIVE:  The patient reports that she is bored but doing well.  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  17 P. on 09/06/2012  Biophysical profiles twice each week. Next on 09/06/2012.  Leonard Schwartz, M.D.

## 2012-09-06 ENCOUNTER — Inpatient Hospital Stay (HOSPITAL_COMMUNITY): Payer: Medicaid Other

## 2012-09-06 NOTE — Progress Notes (Signed)
Sarah Sutton  was seen today for an ultrasound appointment.  See full report in [facility].  Alpha Gula, MD  Single IUP at 30 4/7 weeks PPROM Mild right renal pylectasis (6 mm) without calyeceal dilation BPP 6/8 (-2 for oligohydramnios)  Continue inpatient management. 2x weekly BPPs.

## 2012-09-06 NOTE — Progress Notes (Signed)
Patient ID: Sarah Sutton, female   DOB: Sep 15, 1991, 21 y.o.   MRN: [id number] Pt without complaints.  constant leakage of fluid or VB.  Good FM  BP 121/70  Pulse 92  Temp 98.4 F (36.9 C) (Oral)  Resp 20  Ht 5' 7.5" (1.715 m)  Wt 106.232 kg (234 lb 3.2 oz)  BMI 36.14 kg/m2  SpO2 99%  LMP 01/17/2012  FHTS Baseline: 140 bpm, Variability: Good {> 6 bpm), Accelerations: Reactive and Decelerations: Absent  Toco none  Pt in NAD CV RRR Lungs CTAB abd  Gravid soft and NT GU no vb EXt no calf tenderness Results for orders placed during the hospital encounter of 08/18/12 (from the past 72 hour(s))  GLUCOSE TOLERANCE, 1 HOUR     Status: Normal   Collection Time   09/04/12  8:45 AM      Component Value Range Comment   Glucose, 1 Hour GTT 119  70 - 140 mg/dL   BPP 6/8 -2 for breathing AFI 4.0 vtx  Assessment and Plan [redacted]w[redacted]d  PPROM Pt stable Continue care

## 2012-09-07 NOTE — Progress Notes (Signed)
Patient ID: Sarah Sutton, female   DOB: 1991/03/05, 21 y.o.   MRN: [id number]  Hospital day # 20 pregnancy at [redacted]w[redacted]d - PPROM   S: well, reports good fetal activity, no c/o       Contractions:none      Vaginal bleeding:none now       Vaginal discharge: no significant change  O: BP 131/61  Pulse 92  Temp 98.4 F (36.9 C) (Oral)  Resp 18  Ht 5' 7.5" (1.715 m)  Wt 234 lb 3.2 oz (106.232 kg)  BMI 36.14 kg/m2  SpO2 99%  LMP 01/17/2012      Fetal tracings:reviewed and reassuring      Uterus non-tender      Extremities: no significant edema and no signs of DVT  A: [redacted]w[redacted]d with PPROM and circlage     stable  P: continue current plan of care  Kyuss Hale M  CNM  09/07/2012 8:01 PM

## 2012-09-08 MED ORDER — CYCLOBENZAPRINE HCL 10 MG PO TABS
10.0000 mg | ORAL_TABLET | Freq: Two times a day (BID) | ORAL | Status: DC | PRN
  Administered 2012-09-08: 10 mg via ORAL
  Filled 2012-09-08: qty 1

## 2012-09-08 NOTE — Progress Notes (Signed)
Patient ID: Sarah Sutton, female   DOB: 24-Nov-1991, 21 y.o.   MRN: [id number] Pt without complaints.  contiuous leakage of fluid, no VB.  Good FM  BP 132/72  Pulse 102  Temp 97.7 F (36.5 C) (Oral)  Resp 20  Ht 5' 7.5" (1.715 m)  Wt 106.232 kg (234 lb 3.2 oz)  BMI 36.14 kg/m2  SpO2 99%  LMP 01/17/2012  FHTS Baseline: 130 bpm, Variability: Good {> 6 bpm) and Accelerations: Reactive  Toco none  Pt in NAD CV RRR Lungs CTAB abd  Gravid soft and NT GU no vb EXt no calf tenderness No results found for this or any previous visit (from the past 72 hour(s)).  Assessment and Plan [redacted]w[redacted]d  PPROM Continue current management Pt stable

## 2012-09-08 NOTE — Progress Notes (Signed)
1830-pt c/o of chest pain over her right breast. States it is not acid reflux because she knows what that feels like. Lungs clear bilaterally, ? dimished in the bases. Pt states that the pain does not radiate anywhere. Skin warm and dry does not appear to be in distress. Dr. Normand Sloop notified. Orders received to place pulse ox on pt and to give her 2 tylenol.

## 2012-09-09 NOTE — Progress Notes (Signed)
Ur chart review completed.  

## 2012-09-09 NOTE — Progress Notes (Signed)
Hospital day # 22 pregnancy at [redacted]w[redacted]d--SROM on 08/18/12, with cerclage in place.  S:  Doing well, reports good fetal activity      Perception of contractions: none      Vaginal bleeding: None       Vaginal discharge:  Small amount clear fluid.  Patient reports feeling as though she gets just a brief overview of maternal/fetal status when providers and nurses are discussing her status.  She requests more indepth discussion of BPP/AFI results, etc, as tests are done or evaluations are performed.  She was not aware of what the parameters evaluated in the BPP reflected.  O: BP 119/83  Pulse 106  Temp 98.4 F (36.9 C) (Oral)  Resp 18  Ht 5' 7.5" (1.715 m)  Wt 234 lb 3.2 oz (106.232 kg)  BMI 36.14 kg/m2  SpO2 100%  LMP 01/17/2012      Fetal tracings:  Reactive on TID hourly tracings      Contractions:   None      Uterus gravid and non-tender      Extremities: no significant edema and no signs of DVT       SCDs on when in bed.            A: [redacted]w[redacted]d with PPROM since 9/22, cerclage in place     Stable        P: Continue current plan of care      BPP tomorrow (Tuesday/Friday)      Reviewed on-going evaluations of maternal/fetal status,   including BPP parameters, vital signs, etc.      Encouraged patient to write down questions as they   occur and express concerns, questions, and issues  to providers and staff.      Support to patient for issues of long-term hospitalization.       MDs will follow  Nigel Bridgeman CNM, [place] 09/09/2012 10:44 AM

## 2012-09-09 NOTE — Progress Notes (Signed)
Patient ID: Sarah Sutton, female   DOB: 1991-08-07, 21 y.o.   MRN: [id number] Pt's main concern is that she has been given due dates from 12/15 to 11/12/12.  She thinks she wants to deliver on11/5/13 at which time she will be 34 weeks by a 12/17 due date.  11/11/12 seems the most accurate, based on an 8 week U/S.  She will confirm her preference as we get closer to the date.

## 2012-09-10 ENCOUNTER — Inpatient Hospital Stay (HOSPITAL_COMMUNITY): Payer: Medicaid Other

## 2012-09-10 DIAGNOSIS — O4290 Premature rupture of membranes, unspecified as to length of time between rupture and onset of labor, unspecified weeks of gestation: Secondary | ICD-10-CM

## 2012-09-10 DIAGNOSIS — O3430 Maternal care for cervical incompetence, unspecified trimester: Secondary | ICD-10-CM

## 2012-09-10 MED ORDER — LOPERAMIDE HCL 2 MG PO CAPS
2.0000 mg | ORAL_CAPSULE | Freq: Two times a day (BID) | ORAL | Status: DC | PRN
  Administered 2012-09-10 – 2012-09-29 (×2): 2 mg via ORAL
  Filled 2012-09-10 (×2): qty 1

## 2012-09-10 NOTE — Progress Notes (Addendum)
Hospital day # 23 pregnancy at [redacted]w[redacted]d  S: well, reports good fetal activity      Contractions: Braxton Hicks Contractions only      Vaginal bleeding:none now       Vaginal discharge: watery, yellow and no significant change  O: BP 138/69  Pulse 94  Temp 98.4 F (36.9 C) (Oral)  Resp 20  Ht 5' 7.5" (1.715 m)  Wt 234 lb 3.2 oz (106.232 kg)  BMI 36.14 kg/m2  SpO2 100%  LMP 01/17/2012      Fetal tracings:Biophysical Profile: BPP Score 8/8 09/09/12 reviewed and reassuring      Uterus gravid, consistent with  31 weeks and non-tender      Extremities: extremities normal, atraumatic, no cyanosis or edema and no significant edema and no signs of DVT  A: [redacted]w[redacted]d with PPROM, Afebrile     Unchanged.  P:Ccontinue current plan of care  DAVIES, DENISE , CNM. 09/10/2012 12:16 PM  Agree with above.  Pt in better spirits today but c/o diarrhea x 3 days with decreased appetite but no vomiting, slight nausea  No abd pain. Encouraged po H2O.  Will try imodium and send stool for c-diff.- AYR

## 2012-09-11 NOTE — Progress Notes (Signed)
09/11/12 1200  Clinical Encounter Type  Visited With Patient  Visit Type Follow-up;Spiritual support;Social support  Spiritual Encounters  Spiritual Needs Emotional    Sarah Sutton was in great spirits today!  She welcomed me in enthusiastically and with very positive updates about how she's worked to change her perspective and outlook.  Now she's feeling grateful to have the break of being cared for here at the hospital before life changes so dramatically with baby Jazlen's arrival.  Per pt, she really appreciates the "super" people caring for her, as well as the shared understanding they've reached about her needs and personality (especially desire for privacy and time to herself).  She reports setting some boundaries with friends and family to reduce their communication with her in order to afford personal privacy and space, as well.    I provided pastoral listening, reflection, affirmation, and celebration.  We plan to visit again next week.  [address] [place], [place] [phone]

## 2012-09-11 NOTE — Progress Notes (Signed)
Patient ID: Sarah Sutton, female   DOB: 1991/04/05, 21 y.o.   MRN: [id number] Pt without complaints.  contiuousleakage of fluid or VB.  Good FM  BP 126/70  Pulse 90  Temp 98.1 F (36.7 C) (Oral)  Resp 18  Ht 5' 7.5" (1.715 m)  Wt 234 lb 3.2 oz (106.232 kg)  BMI 36.14 kg/m2  SpO2 100%  LMP 01/17/2012  FHTS Baseline: 130 bpm, Variability: Good {> 6 bpm) and Accelerations: Reactive  Toco none  Pt in NAD CV RRR Lungs CTAB abd  Gravid soft and NT GU no vb EXt no calf tenderness No results found for this or any previous visit (from the past 72 hour(s)).  Assessment and Plan [redacted]w[redacted]d  PPROM with cerclage.  Pt  Is stable.  Will continue current care.

## 2012-09-12 LAB — CLOSTRIDIUM DIFFICILE BY PCR: Toxigenic C. Difficile by PCR: NEGATIVE

## 2012-09-12 NOTE — Progress Notes (Signed)
UR Chart review completed.  

## 2012-09-12 NOTE — Progress Notes (Signed)
21 y.o. year old female,at [redacted]w[redacted]d gestation.  The patient has a history of incompetent cervix and premature and preterm rupture membranes.  SUBJECTIVE:  She is doing well. Her amniotic fluid leaks very little. Her diarrhea has resolved.  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  Biophysical profile twice each week.  17 P. on Fridays.  Leonard Schwartz, M.D.

## 2012-09-13 ENCOUNTER — Inpatient Hospital Stay (HOSPITAL_COMMUNITY): Payer: Medicaid Other

## 2012-09-13 ENCOUNTER — Encounter (HOSPITAL_COMMUNITY): Payer: Self-pay | Admitting: *Deleted

## 2012-09-13 NOTE — Progress Notes (Signed)
Patient ID: Arieanna Pressey, female   DOB: 11/29/1990, 21 y.o.   MRN: [id number] [place] Langstaff is a 21 y.o. G1P0000 at [redacted]w[redacted]d by LMP admitted for PPROM  Subjective: GI: intermittent cramping, more frequent but not regulare GU: Denies: dysuria, frequency/urgency, vaginal bleeding OB: good fetal movement        Objective: BP 143/70  Pulse 98  Temp 98.2 F (36.8 C) (Oral)  Resp 18  Ht 5' 7.5" (1.715 m)  Wt 238 lb (107.956 kg)  BMI 36.73 kg/m2  SpO2 100%  LMP 01/17/2012      FHT:  FHR: 140s bpm, variability: moderate,  accelerations:  Present,  decelerations:  Absent UC:   rare SVE:   Dilation: Closed (cerclage intact) Exam by:: Manfred Arch CNM  Labs: Lab Results  Component Value Date   WBC 9.3 08/22/2012   HGB 10.6* 08/22/2012   HCT 31.3* 08/22/2012   MCV 88.2 08/22/2012   PLT 229 08/22/2012    Assessment  PPROM without evidence of infection Subjective increase in uterine activity Fetal Wellbeing:  Category I  Plan: Continue current management   Johanny Segers P 09/13/2012, 1:39 PM

## 2012-09-13 NOTE — Progress Notes (Signed)
[facility]  Indication: 21 yr old G1P0 at [redacted]w[redacted]d with cervical insufficiency s/p cerclage and PPROM for biophysical profile.  Findings: 1. Single intraturine pregnancy. 2. Anterior placenta without evidence of previa. 3. Oligohydramnios with amniotic fluid index of 2.15cm. 4. Again seen is right renal pyelectasis vs duplicated collecting system; the left renal pelvis is normal. 5. The remainder of the limited anatomy survey is normal. 6. Biophysical profile is 6/8 (-2 fluid).  Recommendations: 1. PPROM: - previously counseled - recommend continue inpatient management until delivery - recommend delivery at 34 weeks or sooner for nonreassuring fetal status, labor, or signs/symptoms of chorioamnionitis 2. BPP 6/8: patient sent back to L&D for NST. 3. Recommend continue antenatal testing and fetal monitoring as previously recommended. 4. Recommend removal of cerclage at time of delivery or sooner if develops painful contractions. 5. Pyelectasis: previously counseled. Recommend inform Pediatricians at the time of delivery. 6. Abnormal quad screen: previously counseled. Normal [place].  Eulis Foster, MD

## 2012-09-14 MED ORDER — ZOLPIDEM TARTRATE 5 MG PO TABS
10.0000 mg | ORAL_TABLET | Freq: Every evening | ORAL | Status: DC | PRN
  Administered 2012-09-14 – 2012-09-29 (×16): 10 mg via ORAL
  Filled 2012-09-14 (×16): qty 2

## 2012-09-14 NOTE — Progress Notes (Signed)
Patient ID: Sarah Sutton, female   DOB: 1991-01-16, 21 y.o.   MRN: [id number] Sarah Sutton is a 21 y.o. G1P0000 at [redacted]w[redacted]d by LMP admitted for pPROM  Subjective: GI: Normal BM's, denies hematochezia, melena or pain., Denies nausea, vomiting or diarrhea. GU: Denies: dysuria, frequency/urgency, hematuria, vaginal bleeding OB: good fetal movement.  C/o achiness in her pelvis.  No actual contraction GEN:  Feels her Ambien 5 mg is no longer helping.  Sleeps only 4 hrs then awakens        Objective: BP 132/72  Pulse 97  Temp 98.4 F (36.9 C) (Oral)  Resp 20  Ht 5' 7.5" (1.715 m)  Wt 238 lb (107.956 kg)  BMI 36.73 kg/m2  SpO2 100%  LMP 01/17/2012      FHT:  FHR: 1440s bpm, variability: minimal ,  accelerations:  Present,  decelerations:  Absent UC:   rare SVE:   Dilation: Closed (cerclage intact) Exam by:: Manfred Arch CNM  Labs: Lab Results  Component Value Date   WBC 9.3 08/22/2012   HGB 10.6* 08/22/2012   HCT 31.3* 08/22/2012   MCV 88.2 08/22/2012   PLT 229 08/22/2012    Assessment / Plan: pPROM Fetal Wellbeing:  Category I Insomnia. Will increase Ambien to 10 mg.   Sarah Sutton P 09/14/2012, 1:58 PM

## 2012-09-15 NOTE — Progress Notes (Signed)
Received in report - pt. Does not want to be awaken - she will call out when she needs something.  RN entered the room for hourly rounds - pt. Asleep, [name] snoring-asleep on couch. Pt. RR - 18.

## 2012-09-15 NOTE — Progress Notes (Signed)
RN called to room by patient, "I feel a sharp pain in my lower, left abd."  RN palpated area, soft to touch, pt. Wiped vagina (clear wet scant fluid). RN will continue to monitor.  EFM & toco applied - FHR - 140s, no ctxs noted or palpated.

## 2012-09-15 NOTE — Progress Notes (Signed)
Patient ID: Sarah Sutton, female   DOB: [birth year], 21 y.o.   MRN: [id number] Sarah Sutton is a 21 y.o. G1P0000 at [redacted]w[redacted]d by LMP admitted for pPROM  Subjective: GI: Denies nausea, vomiting or diarrhea., abdominal pain in LLQ that is mild and present for the last 15 mins GU: Denies: dysuria, frequency/urgency, vaginal bleeding OB: good fetal movement Neuro: c/o left fingers tingling.  No actual numbness or loss of strength        Objective: BP 137/72  Pulse 94  Temp 98.2 F (36.8 C) (Oral)  Resp 18  Ht 5' 7.5" (1.715 m)  Wt 238 lb (107.956 kg)  BMI 36.73 kg/m2  SpO2 100%  LMP 01/17/2012     Strength in hands and upper arms 4/4 bilaterally FHT:  FHR: 140s bpm, variability: moderate,  accelerations:  Present,  decelerations:  Present as infrequent rapid variables.  Only one contraction noted during 1 hr of monitoring UC:   See above SVE:   Dilation: Closed (cerclage intact) Exam by:: Manfred Arch CNM  Labs: Lab Results  Component Value Date   WBC 9.3 08/22/2012   HGB 10.6* 08/22/2012   HCT 31.3* 08/22/2012   MCV 88.2 08/22/2012   PLT 229 08/22/2012    Assessment / Plan: Probable carpal tunnel. Fetal Wellbeing:  Category II with BPP 8/10 on 09/13/12.  All reassuring New onset mild LLQ pain without evidence of labor, or GI or GU sx, may be muskuloskeletal  Plan: Left hand splint for carpal tunnel offered. Pt will decide re use. Continue current monitoring scheduling.     Khyre Germond P 09/15/2012, 3:22 PM

## 2012-09-15 NOTE — Progress Notes (Signed)
Patient ID: Sarah Sutton, female   DOB: August 08, 1991, 21 y.o.   MRN: [id number]  Hospital day # 28 pregnancy at [redacted]w[redacted]d  S: doing well, reports good fetal activity      Contractions: occasionally feels "baby balling up" occ cramping      Vaginal bleeding:none now       Vaginal discharge: no significant change Reports increased pelvic pressure, not much different from yesterday  O: BP 137/72  Pulse 94  Temp 98.2 F (36.8 C) (Oral)  Resp 18  Ht 5' 7.5" (1.715 m)  Wt 238 lb (107.956 kg)  BMI 36.73 kg/m2  SpO2 100%  LMP 01/17/2012      Fetal tracings:reviewed and reassuring      Uterus non-tender      Extremities: no significant edema and no signs of DVT  A: [redacted]w[redacted]d with PPROM      stable  P: continue current plan of care Will consult w MD re: delivery plans and removing of circlage,  Ordered PT consult for prolonged bedrest   Estephany Perot M  CNM 09/15/2012 2:40 PM

## 2012-09-15 NOTE — Progress Notes (Signed)
Pt. Verbalized "some" pelvic pressure.  RN asked pt. To call if pressure increased or if she feels discomfort getting worse.  Pt. verbalized agreement.

## 2012-09-15 NOTE — Progress Notes (Signed)
Reported from night shift RN in change of shift report - pt. Will call out if she needs something. During a.m. Assessment, RN confirmed with patient if she wanted to be rounded on hourly, pt. Stated, "No, I'll call out when I need something."  Explained to pt. That I would need to check on her occasionally and to bring her meds; and also if the doctor orders something different than current orders, I would need to come to her bedside - she stated, "OK".  (polite and calm manner)  Pt. Asked to be put on EFM & toco at this time. Pt. Placed on monitors for a.m. 1 hr monitoring (per orders).

## 2012-09-16 NOTE — Progress Notes (Signed)
Ur chart review completed.  

## 2012-09-16 NOTE — Progress Notes (Addendum)
In to see patient in f/u to previous note.  S:  Doing well today--in good spirits.  Feels time is now passing quickly toward goal of 34 weeks. Has questions regarding specific plan for timing of cerclage removal and initiation of induction.  Denies pain or contractions today.  O:   Filed Vitals:   09/15/12 2200 09/15/12 2250 09/16/12 0003 09/16/12 1032  BP:  144/66  117/62  Pulse:  105  101  Temp:  98.4 F (36.9 C)  98.5 F (36.9 C)  TempSrc:  Oral  Oral  Resp: 20 20 20 18   Height:      Weight:      SpO2:       Chest clear. Heart RRR without murmur Abd gravid, NT Pelvic--deferred. Leaking small amount of clear fluid at times. Ext WNL.  FHR Category 1 on NST just completed. No UCs  A:  32 weeks with PPROM 9/22.      Stable  P:  Reviewed questions--referred patient to discussion with MDs regarding specifics of plan.      Support to patient for long-term hospitalization issues.  Nigel Bridgeman, CNM  Seen and agreed Pt would like to have cerclage removed on 09/30/12 but would prefer to deliver on 10/01/12. Aware that we certainly do not control all variables of labor.

## 2012-09-16 NOTE — Progress Notes (Addendum)
Hospital day # 29 pregnancy at [redacted]w[redacted]d--SROM 08/18/12.  S:  Sleeping at present--generally prefers not to be disturbed until she awakens on her own.  Per RN, no significant issues overnight.  Did not complain of previously-noted LLQ pain. O: BP 144/66  Pulse 105  Temp 98.4 F (36.9 C) (Oral)  Resp 20  Ht 5' 7.5" (1.715 m)  Wt 238 lb (107.956 kg)  BMI 36.73 kg/m2  SpO2 100%  LMP 01/17/2012      Fetal tracings:  Overall reassuring on TID monitoring.  On review of last tracing around midnight, baseline initially                 130-140, with moderate variability, then likely had prolonged elevations with activity, then brief returns to baseline (vs       a higher baseline with variables).  Fetal baseline has generally been 130s-140s on prior tracings.       Contractions:   None       A: [redacted]w[redacted]d with PPROM since 08/18/12.     Stable  P: Continue current plan of care      Upcoming tests/treatments:  BPP 2x/week      MDs will follow  Nigel Bridgeman CNM, [place] 09/16/2012 9:25 AM

## 2012-09-17 ENCOUNTER — Inpatient Hospital Stay (HOSPITAL_COMMUNITY): Payer: Medicaid Other

## 2012-09-17 ENCOUNTER — Inpatient Hospital Stay (HOSPITAL_COMMUNITY)
Admit: 2012-09-17 | Discharge: 2012-09-17 | Disposition: A | Discharge status: Home / Self Care | Payer: Medicaid Other | Attending: Obstetrics and Gynecology | Admitting: Obstetrics and Gynecology

## 2012-09-17 DIAGNOSIS — O4290 Premature rupture of membranes, unspecified as to length of time between rupture and onset of labor, unspecified weeks of gestation: Secondary | ICD-10-CM

## 2012-09-17 NOTE — Progress Notes (Signed)
21 y.o. year old female,at [redacted]w[redacted]d gestation.  She has preterm and premature rupture membranes. She has a history of incompetent cervix and she still has a cerclage in place. A bile physical profile this morning was read as 4/8. A repeat biophysical profile is scheduled for 2:00 today.  SUBJECTIVE:  The patient reports that her abdomen is slightly tender today. She is concerned about her baby.  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  PLAN:  Continue in-hospital observation.  Repeat biophysical profile today at 2:00. As long as her studies are reassuring but we will continue to observe only.  Leonard Schwartz, M.D.

## 2012-09-17 NOTE — Progress Notes (Signed)
Sarah Sutton  was seen today for an ultrasound appointment.  See full report in [facility].  Alpha Gula, MD  Single IUP at 32 1/7 weeks PPROM BPP today 4/8 (-2 for oligohydramnios, -2 for lack of sustained breathing movement)  Recommend NST on arrival to floor. If non-reactive, recommend delivery. If reactive, recommend repeating BPP in 6 hours (tentatively scheduled for 2 PM this afternoon) - if unchanged, would recommend delivery at that time.

## 2012-09-17 NOTE — Progress Notes (Signed)
Sarah Sutton  was seen today for an ultrasound appointment.  See full report in [facility].  Alpha Gula, MD  Single IUP at 32 1/7 weeks PROM Repeat BPP 8/10 (-2 for oligohydramnios) Reactive NST on floor  Continue inpatient management. 2x weekly BPPs Plan delivery at 34 weeks if testing remains reassuring.

## 2012-09-18 LAB — CBC WITH DIFFERENTIAL/PLATELET
Basophils Absolute: 0 10*3/uL (ref 0.0–0.1)
Basophils Relative: 0 % (ref 0–1)
Eosinophils Relative: 1 % (ref 0–5)
HCT: 34.5 % — ABNORMAL LOW (ref 36.0–46.0)
MCHC: 33.9 g/dL (ref 30.0–36.0)
Monocytes Absolute: 0.6 10*3/uL (ref 0.1–1.0)
Neutro Abs: 4.5 10*3/uL (ref 1.7–7.7)
RDW: 13.6 % (ref 11.5–15.5)

## 2012-09-18 NOTE — Progress Notes (Signed)
09/18/12 1300  Clinical Encounter Type  Visited With Patient and family together ([name])  Visit Type Follow-up    Sarah Sutton and her significant other were resting when I attempted this follow-up visit.  She appreciated check-in, reports that she is doing well, and we planned to follow up either tomorrow or next week.  She is aware of ongoing chaplain availability.  [address] [place], [place] [phone]

## 2012-09-18 NOTE — Progress Notes (Signed)
Patient ID: Sarah Sutton, female   DOB: 1991-02-03, 20 y.o.   MRN: [id number] Pt without complaints.  some leakage of fluid or VB.  Good FM.  She has a vaginal discharge  BP 110/54  Pulse 98  Temp 98.3 F (36.8 C) (Oral)  Resp 18  Ht 5' 7.5" (1.715 m)  Wt 238 lb (107.956 kg)  BMI 36.73 kg/m2  SpO2 100%  LMP 01/17/2012  FHTS Baseline: 150 bpm, Variability: Good {> 6 bpm) and Accelerations: Reactive  Toco none  Pt in NAD CV RRR Lungs CTAB abd  Gravid soft and NT GU no vb EXt no calf tenderness No results found for this or any previous visit (from the past 72 hour(s)).  Assessment and Plan [redacted]w[redacted]d  pprom Pt stable Pt had some elevated bps will check PIH labs

## 2012-09-19 DIAGNOSIS — O4290 Premature rupture of membranes, unspecified as to length of time between rupture and onset of labor, unspecified weeks of gestation: Secondary | ICD-10-CM

## 2012-09-19 DIAGNOSIS — O3430 Maternal care for cervical incompetence, unspecified trimester: Secondary | ICD-10-CM

## 2012-09-19 LAB — COMPREHENSIVE METABOLIC PANEL
Albumin: 2.5 g/dL — ABNORMAL LOW (ref 3.5–5.2)
BUN: 4 mg/dL — ABNORMAL LOW (ref 6–23)
Calcium: 9.2 mg/dL (ref 8.4–10.5)
Creatinine, Ser: 0.43 mg/dL — ABNORMAL LOW (ref 0.50–1.10)
Potassium: 3.5 mEq/L (ref 3.5–5.1)
Total Protein: 6.1 g/dL (ref 6.0–8.3)

## 2012-09-19 LAB — URIC ACID: Uric Acid, Serum: 2.9 mg/dL (ref 2.4–7.0)

## 2012-09-19 NOTE — Progress Notes (Signed)
Patient ID: Sarah Sutton, female   DOB: February 23, 1991, 23 y.o.   MRN: [id number]  Sarah Sutton is a 21 y.o. G1P0000 at [redacted]w[redacted]d admitted for PPROM  Subjective: No complaints except would like daily blood draws to stop.  Pt says she has not been wearing the SCDs.  Denies HA, visual changes or abdominal pain.  Objective: BP 125/62  Pulse 95  Temp 98.2 F (36.8 C) (Oral)  Resp 20  Ht 5' 7.5" (1.715 m)  Wt 238 lb (107.956 kg)  BMI 36.73 kg/m2  SpO2 100%  LMP 01/17/2012      Physical Exam:  Gen: alert Chest/Lungs: cta bilaterally  Heart/Pulse: RRR  Abdomen: soft, gravid, nontender Uterine fundus: soft, nontender, gravid Skin & Color: warm and dry  EXT: negative Homan's b/l, edema neg  FHT:  FHR: 130s bpm, variability: moderate,  accelerations:  Present,  decelerations:  Absent UC:   none SVE:   Dilation: Closed (cerclage intact) Exam by:: Manfred Arch CNM  Labs: Lab Results  Component Value Date   WBC 7.2 09/18/2012   HGB 11.7* 09/18/2012   HCT 34.5* 09/18/2012   MCV 87.1 09/18/2012   PLT 230 09/18/2012    Assessment and Plan: P0 at 32 3/7wks with PPROM currently stable without s/sxs of chorio.  She had a couple of elevated BPs yesterday.  Pt declined foley for 24hr urine with Dr. Normand Sloop per report.  Gest htn labs were wnl.  Will d/c daily labs and observe for s/sxs of preeclampsia.  Pt encouraged to wear SCDs while sleeping.  She is agreeable.  Fetal status is reassuring with cat 1 tracing.  Cont qshift monitoring.   Sarah Sutton Y 09/19/2012, 9:13 AM

## 2012-09-19 NOTE — Progress Notes (Signed)
UR Chart review completed.  

## 2012-09-20 ENCOUNTER — Inpatient Hospital Stay (HOSPITAL_COMMUNITY): Payer: Medicaid Other

## 2012-09-20 NOTE — Progress Notes (Signed)
Hospital day # 33 pregnancy at [redacted]w[redacted]d  S: well, reports good fetal activity; up early this morning for BPP & saw Dr. Otho Perl w/ MFM; pt's mood is good this morning.  Denies PIH s/s.  Wears SCDs infrequently, but reports ROM exercises.      Contractions:none, rare      Vaginal bleeding:none now, brown, spotting and only on pad, denies w/ wiping       Vaginal discharge: watery and no significant change  O: BP 139/79  Pulse 122  Temp 98.5 F (36.9 C) (Oral)  Resp 20  Ht 5' 7.5" (1.715 m)  Wt 238 lb (107.956 kg)  BMI 36.73 kg/m2  SpO2 100%  LMP 01/17/2012;  .Marland Kitchen Filed Vitals:   09/19/12 1628 09/19/12 2109 09/19/12 2227 09/20/12 0742  BP: 135/67 149/75 135/72 139/79  Pulse: 97 114 104 122  Temp: 98.2 F (36.8 C) 98.2 F (36.8 C)  98.5 F (36.9 C)  TempSrc: Oral Oral  Oral  Resp: 20 20 20 20   Height:      Weight:      SpO2:            Fetal tracings: 140, moderate variability, reactive, no decels on last night's tracing; reviewed and reassuring      Uterus gravid and non-tender      Extremities: extremities normal, atraumatic, no cyanosis or edema, Homans sign is negative, no sign of DVT and no significant edema and no signs of DVT U/s today:  8/8 BPP w/ "subjectively decreased" AFI; largest pocket around 2cm. Vtx presentation.  A: [redacted]w[redacted]d with PPROM     Systolic BP's trending up, but normal PIH labs.         No s/s of chorio.  P: continue current plan of care; Has growth u/s scheduled for 10/29 and plan to induce at 34 weeks if no change prior to that.  CTO BP closely.  Enc'd ROM exercises and use of SCD's.   Antonietta Breach  MD 09/20/2012 10:21 AM

## 2012-09-20 NOTE — Progress Notes (Signed)
Ms. Sliva was seen for ultrasound appointment today.  Please see [facility] report for details.

## 2012-09-21 NOTE — Progress Notes (Signed)
21 y.o. year old female,at [redacted]w[redacted]d gestation.  She was hospitalized on 08/18/2012 premature rupture membranes. She has an incompetent cervix and has a cerclage in place.  SUBJECTIVE:  The patient complains of a vague uterine pain in the left upper quadrant and the left lower quadrant. She does not describe it as sharp or crampy. She says she had a normal bowel movement yesterday. She has no trouble passing her urine. She continues to leak a small amount of amniotic fluid but says that there is no odor.  OBJECTIVE:  BP 126/70  Pulse 102  Temp 97.7 F (36.5 C) (Oral)  Resp 20  Ht 5' 7.5" (1.715 m)  Wt 238 lb (107.956 kg)  BMI 36.73 kg/m2  SpO2 100%  LMP 01/17/2012  Fetal Heart Tones:  Category 1  Contractions:          Few  Chest: Clear Heart: Regular rate and rhythm Abdomen: Gravid, no point tenderness appreciated, bowel sounds positive  Biophysical profile yesterday by maternal fetal medicine: 8 out of 8; vertex; decrease amniotic fluid but fluid pockets are noted.  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 The patient declines a CBC. She declines pain medication. She wants to observe only at this point. The patient was told to notify us if her discomfort becomes greater.  Continue observation in the hospital.  Leonard Schwartz, M.D.

## 2012-09-22 NOTE — Progress Notes (Signed)
21 y.o. year old female,at [redacted]w[redacted]d gestation.  The patient has preterm and premature rupture membranes. She has a history of incompetent cervix and she has a cerclage in place. Has been hospitalized in 08/18/2012.  SUBJECTIVE:  The patient says she is frustrated by sitting in the hospital and by being uncomfortable.  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 The patient again declines pain medications.  Leonard Schwartz, M.D.

## 2012-09-23 NOTE — Progress Notes (Addendum)
..   Hospital day # 36 pregnancy at [redacted]w[redacted]d  S: c/o increased pressure in pelvis and vagina.  Declined cervical exam.  Reports increasing pressure last 2 days, but worse this morning.  Denies BRB, but has continued to have brown spotting on pad--no change.  reports good fetal activity.  Denies abdominal pain or ctxs.  No PIH s/s.       Contractions:none, rare      Vaginal bleeding:brown and spotting       Vaginal discharge: watery, bloody and no significant change  O: BP 132/67  Pulse 95  Temp 98.1 F (36.7 C) (Oral)  Resp 18  Ht 5' 7.5" (1.715 m)  Wt 238 lb (107.956 kg)  BMI 36.73 kg/m2  SpO2 100%  LMP 01/17/2012 .Marland Kitchen       Fetal tracings:Fetal heart variability: moderate Fetal Heart Rate decelerations: none Fetal Heart Rate accelerations: yes Baseline FHR: 130 per minute Fetal Non-stress Test: reactive Uterine contractions: rare reviewed and reassuring Gen:  Mood in down and "frustrated"       Uterus gravid and non-tender      Extremities: no significant edema and no signs of DVT  A: [redacted]w[redacted]d with cerclage and PPROM     No s/s of chorio or ctxs      P: continue current plan of care     Growth u/s scheduled for tomorrow; IOL at 34 weeks; CTO status closely; support given to pt r/e recommendation to further gestation to 34 weeks.  Antonietta Breach  MD 09/23/2012 2:17 PM    Seen and agreed. Planning cerclage removal and IOL 09/30/12

## 2012-09-23 NOTE — Progress Notes (Signed)
Ur chart review completed.  

## 2012-09-24 ENCOUNTER — Inpatient Hospital Stay (HOSPITAL_COMMUNITY): Payer: Medicaid Other

## 2012-09-24 MED ORDER — ONDANSETRON 8 MG PO TBDP
8.0000 mg | ORAL_TABLET | Freq: Three times a day (TID) | ORAL | Status: DC | PRN
  Administered 2012-09-24: 8 mg via ORAL
  Filled 2012-09-24: qty 1

## 2012-09-24 MED ORDER — HYDROXYPROGESTERONE CAPROATE 250 MG/ML IM OIL
250.0000 mg | TOPICAL_OIL | INTRAMUSCULAR | Status: DC
  Administered 2012-09-27: 250 mg via INTRAMUSCULAR
  Filled 2012-09-24: qty 1

## 2012-09-24 NOTE — Progress Notes (Signed)
Pt still nauseated and unable to eat her dinner.  Pt stating that she was able to eat some saltine crackers and sprite and keep it down.  Zofran ODT given to pt

## 2012-09-24 NOTE — Progress Notes (Signed)
Sarah Sutton is a 21 y.o. G1P0000 at [redacted]w[redacted]d by LMP admitted for pPROM  Subjective: GI: negative GU: Denies: dysuria, frequency/urgency, hematuria, vaginal bleeding, pelvic pain OB: good fetal movement        Objective: BP 151/70  Pulse 118  Temp 98.1 F (36.7 C) (Oral)  Resp 20  Ht 5' 7.5" (1.715 m)  Wt 238 lb (107.956 kg)  BMI 36.73 kg/m2  SpO2 100%  LMP 01/17/2012    BPP:  6/8 with 2 off for fluid EFW:  4LB 10OZ  FHT:  FHR: 140s bpm, variability: moderate,  accelerations:  Present,  decelerations:  Absent UC:   rare SVE:   Dilation: Closed (cerclage intact) Exam by:: Manfred Arch CNM  Labs: Lab Results  Component Value Date   WBC 7.2 09/18/2012   HGB 11.7* 09/18/2012   HCT 34.5* 09/18/2012   MCV 87.1 09/18/2012   PLT 230 09/18/2012    Assessment / Plan: pPROM with no labor and reassuring fetal status Incompetent cervix Mild BP elevations with nl PIH labs Fetal Wellbeing:  Category I  Plan: Induction at 34 wks List of pediatricians given    Sarah Sutton P 09/24/2012, 11:22 AM

## 2012-09-25 NOTE — Progress Notes (Signed)
21 y.o. year old female,at [redacted]w[redacted]d gestation.  The patient was admitted on 08/18/2012 preterm and premature rupture of membranes. She has a history of incompetent cervix she has a cerclage in place.  SUBJECTIVE:  Feels better today. Anxious for induction at 34 weeks.  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  Twice weekly biophysical profiles (next BPP on 09/27/2012).  Leonard Schwartz, M.D.

## 2012-09-26 LAB — OB RESULTS CONSOLE GBS: GBS: NEGATIVE

## 2012-09-26 NOTE — Progress Notes (Signed)
UR Chart review completed.  

## 2012-09-26 NOTE — Progress Notes (Signed)
Sarah Sutton is a 21 y.o. G1P0000 at [redacted]w[redacted]d Reason for Admission:cerclage with PPROM Patient Active Problem List  Diagnosis  . Polycystic ovarian syndrome  . Galactorrhea on right side  . Trichomonas contact, treated  . Obesity  . Abnormal facial hair  . Abnormal quad screen  . 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 Risk of preterm labor--+ FFN 9/6.  Marland Kitchen ROM (rupture of membranes), premature, with cerclage in place  . Right fetal pyelectasis    Subjective:  "I feel like no one is listening to me and may want at C/S I don't want something to go wrong and no one pays attention and then I have to live with it" Denies vag bleeding, uc, with +FM, had diarrhea x 1 this am and a few times yesterday no N,V, has not had much to drink today, stomach hurts at time.  Objective: BP 139/70  Pulse 97  Temp 98.2 F (36.8 C) (Oral)  Resp 20  Ht 5' 7.5" (1.715 m)  Wt 238 lb (107.956 kg)  BMI 36.73 kg/m2  SpO2 100%  LMP 01/17/2012      Physical Exam:  Gen: had been asleep, drowsy, cooperative Chest/Lungs: cta bilaterally  Heart/Pulse: RRR  Abdomen: soft, gravid, nontender, bowel sounds active Uterine fundus: soft, nontender Skin & Color: warm and dry  negative Homan's b/l, edema none  FHT:  NST reactive UC:   none SVE:   Dilation: Closed (cerclage intact) Exam by:: Manfred Arch CNM  Labs: Lab Results  Component Value Date   WBC 7.2 09/18/2012   HGB 11.7* 09/18/2012   HCT 34.5* 09/18/2012   MCV 87.1 09/18/2012   PLT 230 09/18/2012  BP 139/70  Pulse 97  Temp 98.2 F (36.8 C) (Oral)  Resp 20  Ht 5' 7.5" (1.715 m)  Wt 238 lb (107.956 kg)  BMI 36.73 kg/m2  SpO2 100%  LMP 01/17/2012  Assessment and Plan: [redacted]w[redacted]d PPROM, cerclage  has Polycystic ovarian syndrome; Galactorrhea on right side; Trichomonas contact, treated; Obesity; Abnormal facial hair; Abnormal quad screen; 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 Discussed cervical ripening and labor ioptions vs cesarean section with risks of injury to bowel or bladder, hemorrhage, infection, risks for future pregnancies with placenta implantation with Dr. Normand Sloop at bedside. Discussed increased water frequent voids and report if diarrhea/abd pain unresolved. GBS, GC/CHL ordered.  Lavera Guise, CNM 09/26/2012, 1:24 PM

## 2012-09-26 NOTE — Progress Notes (Signed)
09/26/12 1200  Clinical Encounter Type  Visited With Patient not available    Attempted follow-up visit, but pt sleeping and wishes not to be disturbed.  Will continue to follow for support.  [address] [place], [place] [phone]

## 2012-09-27 ENCOUNTER — Inpatient Hospital Stay (HOSPITAL_COMMUNITY): Payer: Medicaid Other

## 2012-09-27 NOTE — Progress Notes (Signed)
MFM  BPP was 4/8; reviewed NST; baseline 135bpm; + accelerations; no decelerations; reactive  Overall BPP 6/8. Recommend keep on continuous monitoring and repeat BPP in 4 hours.  Spoke with Nigel Bridgeman who is in agreement with the above plan.  Eulis Foster, MD

## 2012-09-27 NOTE — Progress Notes (Signed)
[facility] ultrasound  Indication: 21 yr old G1P0 at [redacted]w[redacted]d with cervical insufficiency s/p cerclage and PPROM for biophysical profile.  Findings:  1. Single intraturine pregnancy.  2. Anterior placenta without evidence of previa.  3. Oligohydramnios.  4. Biophysical profile is 4/8 (-2 fluid and breathing).  Recommendations: 1. PPROM:  - previously counseled  - recommend continue inpatient management until delivery  - recommend delivery at 34 weeks or sooner for nonreassuring fetal status, labor, or signs/symptoms of chorioamnionitis  2. BPP 4/8: patient sent back to L&D for NST; if nonreactive recommend delivery; if reactive recommend repeat BPP in 4-6 hours.  3. Recommend continue antenatal testing and fetal monitoring as previously recommended.  4. Recommend removal of cerclage at time of delivery or sooner if develops painful contractions.  5. Pyelectasis: previously counseled. Recommend inform Pediatricians at the time of delivery.  6. Abnormal quad screen: previously counseled. [facility].  Sarah Foster, MD

## 2012-09-27 NOTE — Progress Notes (Addendum)
Hospital day # 40 pregnancy at [redacted]w[redacted]d--PPROM since 08/07/12.  S:  Doing well, reports good fetal activity      Perception of contractions: None      Vaginal bleeding: None       Vaginal discharge:  Occasional leaking of clear fluid  O: BP 126/68  Pulse 95  Temp 98.4 F (36.9 C) (Oral)  Resp 18  Ht 5' 7.5" (1.715 m)  Wt 238 lb (107.956 kg)  BMI 36.73 kg/m2  SpO2 100%  LMP 01/17/2012      Fetal tracings:  Reactive on this am tracing, no decels      Contractions:   None      Uterus non-tender      Extremities: no significant edema and no signs of DVT \      BPP 4/8 in MFM today, with -2 for fluid and FBM.  Now 6/8 due to reactive NST          Labs:  GC/chlamydia negative from 09/26/12                 GBS pending       Has completed ATB courses.      Due 17P today.         A: [redacted]w[redacted]d with PPROM since 08/07/12     Equivocal antenatal testing      P:  MFM recommended repeating BPP in 4 hours (1pm), with continuous monitoring until then.      Reviewed plan with patient, including rationale for continuous monitoring and re-evaluation of status/plan after           results received.  Patient agreeable with that plan.      Will consult with Dr. Su Hilt re: 17P--will we administer today, in light of current plan for induction on Monday?      MDs will follow  Nigel Bridgeman CNM, [place] 09/27/2012 10:38 AM    Pt being continuously monitored.  Tracing is overall reassuring.  MFM plans to repeat BPP.  Per my request RN will ask if they rec continuous monitoring if BPP is good.  Pt understands she will likely be induced if the BPP is unchanged.  Her questions were answered.  Will administer 17P today and if BPP good continue plan for induction at 34wks.

## 2012-09-27 NOTE — Progress Notes (Signed)
[facility] ultrasound  Indication: 21 yr old G1P0 at [redacted]w[redacted]d with cervical insufficiency s/p cerclage and PPROM for biophysical profile secondary to previous biophysical profile of 6/8.  Findings:  1. Single intraturine pregnancy.  2. Anterior placenta without evidence of previa.  3. Oligohydramnios.  4. Normal biophysical profile of 8/8.  Recommendations: 1. PPROM:  - previously counseled  - recommend continue inpatient management until delivery  - recommend delivery at 34 weeks or sooner for nonreassuring fetal status, labor, or signs/symptoms of chorioamnionitis  2. BPP now reassuring; if fetal tracing is reassuring can resume TID monitoring with prolonged monitoring as indicated by the fetal tracing  3. Recommend continue antenatal testing and fetal monitoring as previously recommended.  4. Recommend removal of cerclage at time of delivery or sooner if develops painful contractions.  5. Pyelectasis: previously counseled. Recommend inform Pediatricians at the time of delivery.  6. Abnormal quad screen: previously counseled. [facility].  Eulis Foster, MD

## 2012-09-27 NOTE — Progress Notes (Signed)
Note order in chart for Physical Therapy.  For some reason, order just today showed up on PT worklist, therefore, I apologize for the delay.  Note patient is to be induced on Monday 11/4.  With this information, do not feel PT at this time is warranted.  If patient continues to have pelvic pain or develops other concerns post delivery - please re-order PT. Thank you, 09/27/2012 Corlis Hove, PT [phone]

## 2012-09-27 NOTE — Progress Notes (Signed)
Monitors removed. Pt to mfm for repeat bpp

## 2012-09-27 NOTE — Progress Notes (Signed)
Pt to ultrasound in mfm

## 2012-09-27 NOTE — Progress Notes (Signed)
Informed pt that we need to do one more monitoring on my shift before 7 pm. States i was on monitor forever today. Informed pt to let me know when she was ready to go on monitor.

## 2012-09-28 NOTE — Progress Notes (Signed)
Pt refuses fetal monitoring at this time. States she will call me when she is ready.

## 2012-09-28 NOTE — Progress Notes (Signed)
Pt does not want baby monitored at this time

## 2012-09-28 NOTE — Progress Notes (Signed)
Patient ID: Sarah Sutton, female   DOB: Jun 26, 1991, 21 y.o.   MRN: [id number]  Hospital day # 41 pregnancy at [redacted]w[redacted]d for PPROM on 9/11   S: Pt is sleeping and does want to be disturbed, will come back later  O: BP 134/79  Pulse 88  Temp 98.2 F (36.8 C) (Oral)  Resp 20  Ht 5' 7.5" (1.715 m)  Wt 238 lb (107.956 kg)  BMI 36.73 kg/m2  SpO2 100%  LMP 01/17/2012      Fetal tracings:reviewed and reassuring, reactive, no decels, + accels         A: [redacted]w[redacted]d with PPROM 9/11, s/p rcv'ed BMZ, circlage in place      stable BP's stable, PIH labs WNL  BPP yesterday initially 4/8, repeat was 6/8  P: continue current plan of care Plan was to remove circlage and induce labor on Monday 11-4  Will confirm w MID's on POC Per RN, pt did not want to be disturbed for visit by provider today No c/o by patient per RN   Malissa Hippo  CNM 09/28/2012 6:09 PM

## 2012-09-29 LAB — CULTURE, BETA STREP (GROUP B ONLY)

## 2012-09-29 LAB — TYPE AND SCREEN: ABO/RH(D): A POS

## 2012-09-29 NOTE — Progress Notes (Addendum)
..   Hospital day # 42 pregnancy at [redacted]w[redacted]d  S: inquired about when cerclage to be removed (ie tonight or tomorrow morning).  States Dr. Estanislado Pandy had disc'd may need cytotec first for ripening, but will see what cx is when cerclage removed. Desires epidural for labor.  well, reports good fetal activity. Ready for induction!      Contractions:occ'l      Vaginal bleeding:brown and spotting       Vaginal discharge: watery, mucousy, bloody and no significant change  O: BP 114/57  Pulse 102  Temp 98.1 F (36.7 C) (Oral)  Resp 20  Ht 5' 7.5" (1.715 m)  Wt 238 lb (107.956 kg)  BMI 36.73 kg/m2  SpO2 100%  LMP 01/17/2012 .Marland Kitchen       Fetal tracings:Fetal heart variability: moderate Fetal Heart Rate decelerations: none Fetal Heart Rate accelerations: yes Baseline FHR: 135 per minute Uterine contractions: occ'l, mild reviewed and reassuring      Uterus gravid and non-tender      Extremities: Homans sign is negative, no sign of DVT  A: [redacted]w[redacted]d with PPROM and cerclage     stable  P: Per c/w Dr. Su Hilt, plan cerclage removal this evening and assess need for ripening overnight, for induction      GBS cx from 09/26/12 still pending.  Cx done on 08/18/12 negative, but greater than 92 weeks old.     Will update NICU Antonietta Breach  MD 09/29/2012 10:47 AM  Agree with above.  Pt's cerclage removed now without difficulty.  Minimal bleeding from cerclage removal site.  Pt tolerated removal well. Clear fluid noted.  VE 1/80/-2, vtx.  Fetal status is overall reassuring. Will transfer to L&D at 6am to start induction.  Will order low dose pitocin per protocol.  GBS neg on 09/26/12.

## 2012-09-29 NOTE — Progress Notes (Signed)
1830- cerclage removed by dr. Su Hilt

## 2012-09-30 ENCOUNTER — Encounter (HOSPITAL_COMMUNITY): Payer: Self-pay | Admitting: Anesthesiology

## 2012-09-30 ENCOUNTER — Inpatient Hospital Stay (HOSPITAL_COMMUNITY): Payer: Medicaid Other | Admitting: Anesthesiology

## 2012-09-30 DIAGNOSIS — O3430 Maternal care for cervical incompetence, unspecified trimester: Secondary | ICD-10-CM

## 2012-09-30 DIAGNOSIS — O4290 Premature rupture of membranes, unspecified as to length of time between rupture and onset of labor, unspecified weeks of gestation: Secondary | ICD-10-CM

## 2012-09-30 LAB — CBC WITH DIFFERENTIAL/PLATELET
Eosinophils Absolute: 0.1 10*3/uL (ref 0.0–0.7)
Lymphs Abs: 2.2 10*3/uL (ref 0.7–4.0)
MCH: 29.7 pg (ref 26.0–34.0)
Neutro Abs: 4.2 10*3/uL (ref 1.7–7.7)
Neutrophils Relative %: 60 % (ref 43–77)
Platelets: 215 10*3/uL (ref 150–400)
RBC: 4.07 MIL/uL (ref 3.87–5.11)
WBC: 7 10*3/uL (ref 4.0–10.5)

## 2012-09-30 LAB — RPR: RPR Ser Ql: NONREACTIVE

## 2012-09-30 MED ORDER — DIPHENHYDRAMINE HCL 50 MG/ML IJ SOLN: 12.5000 mg | INTRAMUSCULAR | Status: DC | PRN

## 2012-09-30 MED ORDER — LACTATED RINGERS IV SOLN
500.0000 mL | Freq: Once | INTRAVENOUS | Status: AC
  Administered 2012-09-30: 500 mL via INTRAVENOUS

## 2012-09-30 MED ORDER — IBUPROFEN 600 MG PO TABS: 600.0000 mg | ORAL_TABLET | Freq: Four times a day (QID) | ORAL | Status: DC | PRN

## 2012-09-30 MED ORDER — OXYTOCIN 40 UNITS IN LACTATED RINGERS INFUSION - SIMPLE MED
62.5000 mL/h | INTRAVENOUS | Status: DC
  Administered 2012-09-30 – 2012-10-01 (×2): 62.5 mL/h via INTRAVENOUS

## 2012-09-30 MED ORDER — LACTATED RINGERS IV SOLN
INTRAVENOUS | Status: DC
  Administered 2012-09-30: 16:00:00 via INTRAVENOUS

## 2012-09-30 MED ORDER — FENTANYL 2.5 MCG/ML BUPIVACAINE 1/10 % EPIDURAL INFUSION (WH - ANES)
14.0000 mL/h | INTRAMUSCULAR | Status: DC
  Administered 2012-09-30: 14 mL/h via EPIDURAL
  Filled 2012-09-30: qty 125

## 2012-09-30 MED ORDER — OXYTOCIN BOLUS FROM INFUSION: 500.0000 mL | INTRAVENOUS | Status: DC

## 2012-09-30 MED ORDER — PHENYLEPHRINE 40 MCG/ML (10ML) SYRINGE FOR IV PUSH (FOR BLOOD PRESSURE SUPPORT)
80.0000 ug | PREFILLED_SYRINGE | INTRAVENOUS | Status: DC | PRN
  Filled 2012-09-30: qty 5

## 2012-09-30 MED ORDER — ACETAMINOPHEN 325 MG PO TABS
650.0000 mg | ORAL_TABLET | ORAL | Status: DC | PRN
  Administered 2012-09-30: 650 mg via ORAL
  Filled 2012-09-30: qty 2

## 2012-09-30 MED ORDER — ONDANSETRON HCL 4 MG/2ML IJ SOLN
4.0000 mg | Freq: Four times a day (QID) | INTRAMUSCULAR | Status: DC | PRN
  Administered 2012-09-30: 4 mg via INTRAVENOUS
  Filled 2012-09-30 (×2): qty 2

## 2012-09-30 MED ORDER — TERBUTALINE SULFATE 1 MG/ML IJ SOLN: 0.2500 mg | Freq: Once | INTRAMUSCULAR | Status: AC | PRN

## 2012-09-30 MED ORDER — LACTATED RINGERS IV SOLN: 500.0000 mL | INTRAVENOUS | Status: DC | PRN

## 2012-09-30 MED ORDER — OXYCODONE-ACETAMINOPHEN 5-325 MG PO TABS: 1.0000 | ORAL_TABLET | ORAL | Status: DC | PRN

## 2012-09-30 MED ORDER — LIDOCAINE HCL (PF) 1 % IJ SOLN
30.0000 mL | INTRAMUSCULAR | Status: DC | PRN
  Filled 2012-09-30: qty 30

## 2012-09-30 MED ORDER — EPHEDRINE 5 MG/ML INJ
10.0000 mg | INTRAVENOUS | Status: DC | PRN
  Filled 2012-09-30: qty 4

## 2012-09-30 MED ORDER — LIDOCAINE HCL (PF) 1 % IJ SOLN
INTRAMUSCULAR | Status: DC | PRN
  Administered 2012-09-30 (×4): 4 mL

## 2012-09-30 MED ORDER — BUTORPHANOL TARTRATE 1 MG/ML IJ SOLN
1.0000 mg | INTRAMUSCULAR | Status: DC | PRN
  Administered 2012-09-30: 1 mg via INTRAVENOUS
  Filled 2012-09-30: qty 1

## 2012-09-30 MED ORDER — EPHEDRINE 5 MG/ML INJ: 10.0000 mg | INTRAVENOUS | Status: DC | PRN

## 2012-09-30 MED ORDER — FLEET ENEMA 7-19 GM/118ML RE ENEM: 1.0000 | ENEMA | RECTAL | Status: DC | PRN

## 2012-09-30 MED ORDER — CITRIC ACID-SODIUM CITRATE 334-500 MG/5ML PO SOLN: 30.0000 mL | ORAL | Status: DC | PRN

## 2012-09-30 MED ORDER — PHENYLEPHRINE 40 MCG/ML (10ML) SYRINGE FOR IV PUSH (FOR BLOOD PRESSURE SUPPORT): 80.0000 ug | PREFILLED_SYRINGE | INTRAVENOUS | Status: DC | PRN

## 2012-09-30 MED ORDER — OXYTOCIN 40 UNITS IN LACTATED RINGERS INFUSION - SIMPLE MED
1.0000 m[IU]/min | INTRAVENOUS | Status: DC
  Administered 2012-09-30: 2 m[IU]/min via INTRAVENOUS
  Filled 2012-09-30: qty 1000

## 2012-09-30 NOTE — Anesthesia Procedure Notes (Signed)
Epidural Patient location during procedure: OB Start time: 09/30/2012 5:03 PM  Staffing Performed by: anesthesiologist   Preanesthetic Checklist Completed: patient identified, site marked, surgical consent, pre-op evaluation, timeout performed, IV checked, risks and benefits discussed and monitors and equipment checked  Epidural Patient position: sitting Prep: site prepped and draped and DuraPrep Patient monitoring: continuous pulse ox and blood pressure Approach: midline Injection technique: LOR air  Needle:  Needle type: Tuohy  Needle gauge: 17 G Needle length: 9 cm and 9 Needle insertion depth: 7.5 cm Catheter type: closed end flexible Catheter size: 19 Gauge Catheter at skin depth: 12.5 cm Test dose: negative  Assessment Events: blood not aspirated, injection not painful, no injection resistance, negative IV test and no paresthesia  Additional Notes Discussed risk of headache, infection, bleeding, nerve injury and failed or incomplete block.  Patient voices understanding and wishes to proceed. Reason for block:procedure for pain

## 2012-09-30 NOTE — Progress Notes (Signed)
Ur chart review completed.  

## 2012-09-30 NOTE — Progress Notes (Signed)
  Subjective: Frustrated with time induction is requiring--"thought it would be moving faster than this".  Aware of some contractions as cramping.  "Can I have a C/S?"  Objective: BP 124/61  Pulse 85  Temp 97.6 F (36.4 C) (Oral)  Resp 20  Ht 5\' 7"  (1.702 m)  Wt 238 lb (107.956 kg)  BMI 37.28 kg/m2  SpO2 100%  LMP 01/17/2012     Pitocin on 20 mu/min FHT:  Category 1 UC:   q 3-5 min, mild  Assessment / Plan: Reviewed process of induction with patient again, including possibility of need for serial induction, and lack of medical justification for C/S. Encouraged patience with process, supported patient in expressing her issues and questions. Will work on using ambulatory monitoring to assist patient's mood.   Nigel Bridgeman 09/30/2012, 1:59 PM

## 2012-09-30 NOTE — Progress Notes (Signed)
  Subjective: Comfortable with epidural, but feeling some mild pressure.  Objective: BP 137/66  Pulse 91  Temp 98.6 F (37 C) (Oral)  Resp 20  Ht 5\' 7"  (1.702 m)  Wt 238 lb (107.956 kg)  BMI 37.28 kg/m2  SpO2 100%  LMP 01/17/2012      FHT:  Category 1 UC:   regular, every 3 minutes, but not tracing well. SVE:   Dilation: 6 Effacement (%): 100 Station: -1 Exam by:: v Isola Mehlman cnm Vtx well applied to cervix.  Tight band noted around cervix. IUPC placed Pitocin on 22 mu/min  Assessment / Plan: PPROM since 9/11--induction Progressive labor Will CTO, augment to adequacy.  Nigel Bridgeman 09/30/2012, 9:08 PM

## 2012-09-30 NOTE — Progress Notes (Signed)
  Subjective: Feeling some pressure.    Objective: BP 126/47  Pulse 92  Temp 98.9 F (37.2 C) (Oral)  Resp 20  Ht 5\' 7"  (1.702 m)  Wt 238 lb (107.956 kg)  BMI 37.28 kg/m2  SpO2 100%  LMP 01/17/2012      FHT:  Early decels noted, single late decel. UC:   q 3 min SVE:   Dilation: Lip/rim Effacement (%): 100 Station: -1 Exam by:: V. Laylynn Campanella cnm Vtx 0 station.  Assessment / Plan: Await further pressure. Care transferred to [facility], CNM Nigel Bridgeman 09/30/2012, 11:21 PM

## 2012-09-30 NOTE — Progress Notes (Signed)
Subjective: Sleeping soundly.  Partner sleeping soundly on couch.  Objective: BP 102/59  Pulse 93  Temp 97.7 F (36.5 C) (Oral)  Resp 18  Ht 5\' 3"  (1.6 m)  Wt 213 lb (96.616 kg)  BMI 37.73 kg/m2  LMP 01/01/2012      FHT:  Category 1 UC:   None Pitocin on 8 mu/min  Assessment / Plan: Continue pitocin induction  Sarah Sutton 09/30/2012, 11:11 AM

## 2012-09-30 NOTE — Anesthesia Preprocedure Evaluation (Signed)
Anesthesia Evaluation  Patient identified by MRN, date of birth, ID band Patient awake    Reviewed: Allergy & Precautions, H&P , NPO status , Patient's Chart, lab work & pertinent test results, reviewed documented beta blocker date and time   History of Anesthesia Complications Negative for: history of anesthetic complications  Airway Mallampati: I TM Distance: >3 FB Neck ROM: full    Dental  (+) Teeth Intact   Pulmonary neg pulmonary ROS,  breath sounds clear to auscultation        Cardiovascular negative cardio ROS  Rhythm:regular Rate:Normal     Neuro/Psych negative neurological ROS  negative psych ROS   GI/Hepatic negative GI ROS, Neg liver ROS,   Endo/Other  diabetes (diet controlled prior to pregnancy), Type obesityPCOS  Renal/GU negative Renal ROS     Musculoskeletal   Abdominal   Peds  Hematology negative hematology ROS (+)   Anesthesia Other Findings   Reproductive/Obstetrics (+) Pregnancy                           Anesthesia Physical Anesthesia Plan  ASA: III  Anesthesia Plan: Epidural   Post-op Pain Management:    Induction:   Airway Management Planned:   Additional Equipment:   Intra-op Plan:   Post-operative Plan:   Informed Consent: I have reviewed the patients History and Physical, chart, labs and discussed the procedure including the risks, benefits and alternatives for the proposed anesthesia with the patient or authorized representative who has indicated his/her understanding and acceptance.     Plan Discussed with:   Anesthesia Plan Comments:         Anesthesia Quick Evaluation

## 2012-09-30 NOTE — Progress Notes (Signed)
  Subjective: Transferred to L&D, now that bed available.  Patient "ready" for process, plan epidural.  Objective: BP 138/77  Pulse 107  Temp 97.6 F (36.4 C) (Oral)  Resp 18  Ht 5\' 7"  (1.702 m)  Wt 238 lb (107.956 kg)  BMI 37.28 kg/m2  SpO2 100%  LMP 01/17/2012      FHT:  Category 1 UC:  None SVE:   Dilation: 1 Effacement (%): 80 Station: -1 Exam by:: Manfred Arch CNM Cervix tight 1cm.  Assessment / Plan: IUP at 34 weeks PPROM since 08/07/12 Cerclage removed 08/29/12 GBS negative  Plan: Initiate pitocin Epidural prn If cervix doesn't respond to pitocin today, will consider using po cytotech tonight. Reviewed plan of care with patient--questions reviewed.  Sarah Sutton 09/30/2012, 9:04 AM

## 2012-10-01 ENCOUNTER — Telehealth: Payer: Self-pay | Admitting: Obstetrics and Gynecology

## 2012-10-01 ENCOUNTER — Encounter (HOSPITAL_COMMUNITY): Payer: Self-pay

## 2012-10-01 LAB — CBC
MCH: 29.2 pg (ref 26.0–34.0)
MCHC: 33.6 g/dL (ref 30.0–36.0)
Platelets: 215 10*3/uL (ref 150–400)

## 2012-10-01 MED ORDER — IBUPROFEN 600 MG PO TABS
600.0000 mg | ORAL_TABLET | Freq: Four times a day (QID) | ORAL | Status: DC
  Administered 2012-10-01 (×3): 600 mg via ORAL
  Filled 2012-10-01 (×4): qty 1

## 2012-10-01 MED ORDER — TETANUS-DIPHTH-ACELL PERTUSSIS 5-2.5-18.5 LF-MCG/0.5 IM SUSP
0.5000 mL | Freq: Once | INTRAMUSCULAR | Status: AC
  Administered 2012-10-02: 0.5 mL via INTRAMUSCULAR
  Filled 2012-10-01: qty 0.5

## 2012-10-01 MED ORDER — DIBUCAINE 1 % RE OINT: 1.0000 "application " | TOPICAL_OINTMENT | RECTAL | Status: DC | PRN

## 2012-10-01 MED ORDER — ONDANSETRON HCL 4 MG PO TABS: 4.0000 mg | ORAL_TABLET | ORAL | Status: DC | PRN

## 2012-10-01 MED ORDER — ZOLPIDEM TARTRATE 5 MG PO TABS: 5.0000 mg | ORAL_TABLET | Freq: Every evening | ORAL | Status: DC | PRN

## 2012-10-01 MED ORDER — WITCH HAZEL-GLYCERIN EX PADS: 1.0000 "application " | MEDICATED_PAD | CUTANEOUS | Status: DC | PRN

## 2012-10-01 MED ORDER — PRENATAL MULTIVITAMIN CH
1.0000 | ORAL_TABLET | Freq: Every day | ORAL | Status: DC
  Administered 2012-10-01: 1 via ORAL
  Filled 2012-10-01 (×2): qty 1

## 2012-10-01 MED ORDER — ACETAMINOPHEN 500 MG PO TABS
1000.0000 mg | ORAL_TABLET | Freq: Four times a day (QID) | ORAL | Status: DC | PRN
  Administered 2012-10-02: 1000 mg via ORAL
  Filled 2012-10-01 (×3): qty 2

## 2012-10-01 MED ORDER — BENZOCAINE-MENTHOL 20-0.5 % EX AERO
1.0000 "application " | INHALATION_SPRAY | CUTANEOUS | Status: DC | PRN
  Administered 2012-10-01: 1 via TOPICAL
  Filled 2012-10-01: qty 56

## 2012-10-01 MED ORDER — DIPHENHYDRAMINE HCL 25 MG PO CAPS: 25.0000 mg | ORAL_CAPSULE | Freq: Four times a day (QID) | ORAL | Status: DC | PRN

## 2012-10-01 MED ORDER — PNEUMOCOCCAL VAC POLYVALENT 25 MCG/0.5ML IJ INJ
0.5000 mL | INJECTION | INTRAMUSCULAR | Status: DC
  Filled 2012-10-01: qty 0.5

## 2012-10-01 MED ORDER — MEDROXYPROGESTERONE ACETATE 150 MG/ML IM SUSP: 150.0000 mg | INTRAMUSCULAR | Status: DC | PRN

## 2012-10-01 MED ORDER — LANOLIN HYDROUS EX OINT: TOPICAL_OINTMENT | CUTANEOUS | Status: DC | PRN

## 2012-10-01 MED ORDER — MISOPROSTOL 200 MCG PO TABS
ORAL_TABLET | ORAL | Status: AC
  Administered 2012-10-01: 800 ug
  Filled 2012-10-01: qty 4

## 2012-10-01 MED ORDER — MAGNESIUM HYDROXIDE 400 MG/5ML PO SUSP: 30.0000 mL | ORAL | Status: DC | PRN

## 2012-10-01 MED ORDER — ONDANSETRON HCL 4 MG/2ML IJ SOLN: 4.0000 mg | INTRAMUSCULAR | Status: DC | PRN

## 2012-10-01 MED ORDER — SIMETHICONE 80 MG PO CHEW: 80.0000 mg | CHEWABLE_TABLET | ORAL | Status: DC | PRN

## 2012-10-01 MED ORDER — MEASLES, MUMPS & RUBELLA VAC ~~LOC~~ INJ
0.5000 mL | INJECTION | Freq: Once | SUBCUTANEOUS | Status: DC
  Filled 2012-10-01: qty 0.5

## 2012-10-01 MED ORDER — OXYCODONE-ACETAMINOPHEN 5-325 MG PO TABS: 1.0000 | ORAL_TABLET | ORAL | Status: DC | PRN

## 2012-10-01 MED ORDER — SENNOSIDES-DOCUSATE SODIUM 8.6-50 MG PO TABS
2.0000 | ORAL_TABLET | Freq: Every day | ORAL | Status: DC
  Administered 2012-10-01: 2 via ORAL

## 2012-10-01 NOTE — Telephone Encounter (Signed)
Information on Nexplanon mailed to pt's home.

## 2012-10-01 NOTE — Progress Notes (Signed)
Post Partum Day 1:S/P SVB at 34 weeks after PPROM x 44 days. Subjective: Patient up ad lib, denies syncope or dizziness.  Baby stable in NICU--on IV and ATB, room air in open crib with warmer. Feeding:  Pumping at present Contraceptive plan:   Considering Nexplanon  Objective: Blood pressure 129/68, pulse 76, temperature 97.6 F (36.4 C), temperature source Oral, resp. rate 18, height 5\' 7"  (1.702 m), weight 238 lb (107.956 kg), last menstrual period 01/17/2012, SpO2 100.00%, unknown if currently breastfeeding.  Physical Exam:  General: alert Lochia: appropriate Uterine Fundus: firm Incision: Perineum clear DVT Evaluation: No evidence of DVT seen on physical exam. Negative Homan's sign.   Basename 10/01/12 0520 09/30/12 0810  HGB 11.2* 12.1  HCT 33.3* 34.9*    Assessment/Plan: S/P Vaginal delivery day 1--PTD, infant in NICU Continue current care Plan for discharge tomorrow Will have office send information on Nexplanon.    LOS: 44 days   Sarah Sutton 10/01/2012, 7:30 AM

## 2012-10-01 NOTE — Anesthesia Postprocedure Evaluation (Signed)
Anesthesia Post Note  Patient: Sarah Sutton  Procedure(s) Performed: * No procedures listed *  Anesthesia type: Epidural  Patient location: Mother/Baby  Post pain: Pain level controlled  Post assessment: Post-op Vital signs reviewed  Last Vitals:  Filed Vitals:   10/01/12 1200  BP: 133/82  Pulse: 90  Temp: 37 C  Resp: 18    Post vital signs: Reviewed  Level of consciousness:alert  Complications: No apparent anesthesia complications

## 2012-10-01 NOTE — Telephone Encounter (Signed)
Message copied by Delon Sacramento on Tue Oct 01, 2012  1:30 PM ------      Message from: Cornelius Moras      Created: Tue Oct 01, 2012  7:48 AM      Regarding: Info on nexplanon       Please send info on nexplanon to patient.      Thanks!      VL

## 2012-10-01 NOTE — Progress Notes (Signed)
10/01/12 1100  Clinical Encounter Type  Visited With Patient  Visit Type Follow-up;Spiritual support;Social support  Spiritual Encounters  Spiritual Needs Emotional    I have followed Sarah Sutton since she was in ante and was struggling with the desire to leave AMA, so following up today after delivery was a huge milestone!  She was happy and so excited to share pictures of baby Sarah Sutton.  I provided pastoral presence, witness to Sarah Sutton's milestones and growth, and celebration of her joy.  We plan to follow up further tomorrow.  [address] [place], [place] [phone]

## 2012-10-01 NOTE — Progress Notes (Signed)
UR Chart review completed.  

## 2012-10-02 MED ORDER — IBUPROFEN 600 MG PO TABS: 600.0000 mg | ORAL_TABLET | Freq: Four times a day (QID) | ORAL | Status: DC

## 2012-10-02 NOTE — Discharge Summary (Signed)
Obstetric Discharge Summary Reason for Admission: rupture of membranes and PPROM at 28wks w circlage in place Prenatal Procedures: cerclage, NST, ultrasound and BMZ course, weekly 17p, BPP's, PIH labs, antibiotics  Intrapartum Procedures: spontaneous vaginal delivery and epidural  Postpartum Procedures: none Complications-Operative and Postpartum: none Hemoglobin  Date Value Range Status  10/01/2012 11.2* 12.0 - 15.0 g/dL Final     HCT  Date Value Range Status  10/01/2012 33.3* 36.0 - 46.0 % Final   Hospital course: Pt was admitted to antepartum on 08-18-12 at 28wks w PPROM and circlage in place. Had rcv'd BMZ course about 10 days prior for +FFN. Pt rcv'd routine antibiotic course upon admission, she remained on bedrest, rcv'd weekly 17p injections. She remained stable throughout her stay, although did struggle w coping emotionally and was seen by SW and chaplain services. She had some sporadic BP elevations without any other symptoms, PIH labs were WNL. Fetal testing was stable w biweekly BPP's growth [place] every 2wks. She had a normal 1hr gtt. On 11-3 circlage was removed per Dr Su Hilt and pitocin induction was started on 11-4 at [redacted]w[redacted]d. She rcv'd epidural, progressed quickly for SVD by H.Steelman, CNM. Infant was stable in NICU, pt was pumping. Contraception plan was nexplanon.   Consults: NICU, Social work, Orthoptist, MFM    Physical Exam:  General: alert, no distress and VERY ready for d/c Lochia: appropriate Uterine Fundus: firm Incision: n/a DVT Evaluation: No evidence of DVT seen on physical exam. Negative Homan's sign. No significant calf/ankle edema.  Discharge Diagnoses: PPROM at 28wks w SVD at 34wks   Discharge Information: Date: 10/02/2012 Activity: pelvic rest Diet: routine Medications: PNV and Ibuprofen Condition: stable Instructions: refer to routine instructions Discharge to: home Follow-up Information    Follow up with [facility]. In 5  weeks. (call to make appointment in 5 weeks or as needed for complications )    Contact information:   [address] [place] [address] [phone]         Newborn Data: Live born female  Birth Weight: 4 lb 11.2 oz (2132 g) APGAR: 5, 7  Infant remains stable in NICU   Sarah Sutton 10/02/2012, 6:45 PM

## 2012-10-04 ENCOUNTER — Encounter (HOSPITAL_COMMUNITY): Payer: Self-pay | Admitting: *Deleted

## 2012-11-14 ENCOUNTER — Encounter: Payer: Self-pay | Admitting: Obstetrics and Gynecology

## 2012-11-14 ENCOUNTER — Ambulatory Visit: Payer: Medicaid Other | Admitting: Obstetrics and Gynecology

## 2012-11-14 NOTE — Progress Notes (Unsigned)
Date of delivery: 09/30/2012 Female Name: Sarah Sutton Vaginal delivery:yes Cesarean section:no Tubal ligation:no GDM:no Breast Feeding:no Bottle Feeding:yes Post-Partum Blues:no Abnormal pap:no 04-15-2012 Normal GU function: yes Normal GI function:yes Returning to work:no EPDS: 5

## 2012-11-16 NOTE — Progress Notes (Unsigned)
Sarah Sutton is a 21 y.o. female who presents for a postpartum visit.   Type of delivery:  SVB, no lacerations, attended by [facility]  Patient reports doing well, no issues.  Hx remarkable for: Patient Active Problem List  Diagnosis  . Polycystic ovarian syndrome  . Galactorrhea on right side  . Trichomonas contact, treated  . Obesity  . Abnormal facial hair  . Abnormal quad screen  . 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 Risk of preterm labor--+ FFN 9/6.  Marland Kitchen ROM (rupture of membranes), premature, with cerclage in place  . Right fetal pyelectasis  . Prolonged rupture of membranes  . Cervical cerclage removed 09/30/12  . NSVD (normal spontaneous vaginal delivery)  . Nuchal cord, delivered, current hospitalization      PPDS = 5, denies SI/HI Contraception plan:  Wants Nexplanon   I have fully reviewed the prenatal and intrapartum course   Patient has been sexually active since delivery.    The following portions of the patient's history were reviewed and updated as appropriate: allergies, current medications, past family history, past medical history, past social history, past surgical history and problem list.  Review of Systems Pertinent items are noted in HPI.   Objective:    BP 110/60  Temp 98.1 F (36.7 C) (Oral)  Wt 244 lb (110.678 kg)  Breastfeeding? No  General:  alert, cooperative and no distress     Lungs: clear to auscultation bilaterally  Heart:  regular rate and rhythm, S1, S2 normal, no murmur  Abdomen: soft, non-tender; bowel sounds normal; no masses,  no organomegaly.   Incision:  NA   Vulva:  normal  Vagina: normal vagina  Cervix:  normal  Uterus: normal size, contour, position, consistency, mobility, non-tender, well-involuted  Adnexa:  normal adnexa             Assessment:     Normal postpartum exam.  Pap smear:   {done:10129} at today's visit.   Plan:  Follow-up at ***. Rxs:  Nigel Bridgeman  CNM, [place] 11/16/2012 3:49 PM

## 2012-11-24 NOTE — Telephone Encounter (Signed)
Error

## 2012-12-10 ENCOUNTER — Ambulatory Visit: Payer: Medicaid Other | Admitting: Obstetrics and Gynecology

## 2012-12-10 ENCOUNTER — Encounter: Payer: Self-pay | Admitting: Obstetrics and Gynecology

## 2012-12-10 VITALS — BP 120/70 | HR 74

## 2012-12-10 DIAGNOSIS — Z30017 Encounter for initial prescription of implantable subdermal contraceptive: Secondary | ICD-10-CM

## 2012-12-10 DIAGNOSIS — IMO0001 Reserved for inherently not codable concepts without codable children: Secondary | ICD-10-CM

## 2012-12-10 MED ORDER — ETONOGESTREL 68 MG ~~LOC~~ IMPL
68.0000 mg | DRUG_IMPLANT | Freq: Once | SUBCUTANEOUS | Status: AC
  Administered 2012-12-10: 68 mg via SUBCUTANEOUS

## 2012-12-10 NOTE — Progress Notes (Signed)
22 YO for Nexplanon insertion.  Patient has done her own research about the Nexplanon and has had her questions answered.  An overview was given of Nexplanon.  O: Nexplanon inserted per protocol in medial left upper arm without difficulty.  Dressed with sterile band-ail and 4 x 4 pressure bandage with      Kling  UPT-negative  A: Nexplanon Insertion  P: RTO-1 week for follow up      Call [facility] [phone]:  -for temperature of 100.4 degrees Fahrenheit or more -pain not improved with over the counter pain medications (Ibuprofen, Advil, Aleve,     Tylenol or acetaminophen) -for excessive bleeding from insertion site -for excessive swelling redness or green drainage from your insertion site -for any other concerns -keep insertion site clean, dry and covered  for 24 hours -you may remove pressure bandage in 1-4 hours  Use a back-up method of birth control for the next 4 weeks  Adolpho Meenach, PA-C

## 2012-12-10 NOTE — Addendum Note (Signed)
Addended byWinfred Leeds on: 12/10/2012 04:04 PM   Modules accepted: Orders

## 2012-12-10 NOTE — Patient Instructions (Signed)
Call [facility] [phone]:  -for temperature of 100.4 degrees Fahrenheit or more -pain not improved with over the counter pain medications (Ibuprofen, Advil, Aleve,     Tylenol or acetaminophen) -for excessive bleeding from insertion site -for excessive swelling redness or green drainage from your insertion site -for any other concerns -keep insertion site clean, dry and covered  for 24 hours -you may remove pressure bandage in 1-4 hours  Use a back-up method of birth control for the next 4 weeks  

## 2012-12-24 ENCOUNTER — Encounter: Payer: Self-pay | Admitting: Obstetrics and Gynecology

## 2012-12-24 ENCOUNTER — Ambulatory Visit: Payer: Medicaid Other | Admitting: Obstetrics and Gynecology

## 2012-12-24 VITALS — BP 114/72 | HR 78 | Wt 249.0 lb

## 2012-12-24 DIAGNOSIS — Z309 Encounter for contraceptive management, unspecified: Secondary | ICD-10-CM

## 2012-12-24 NOTE — Progress Notes (Signed)
21 YO with Nexplanon inserted last week returns for follow up-no complaints.  O:  Medial left upper arm with healed insertion site and no evidence of infection  A:  Follow up Nexplanon Insertion  P: RTO-July AEx or prn  Caasi Giglia, PA-C

## 2013-07-24 IMAGING — US US FETAL BPP W/O NONSTRESS
1 series · 12 of 28 positions shown · non-contrast
Comparison: none

[Series 1: us fetal bpp w/o nonstress · 0.32mm/px · 12 of 59 slices shown]
[im 3/59]
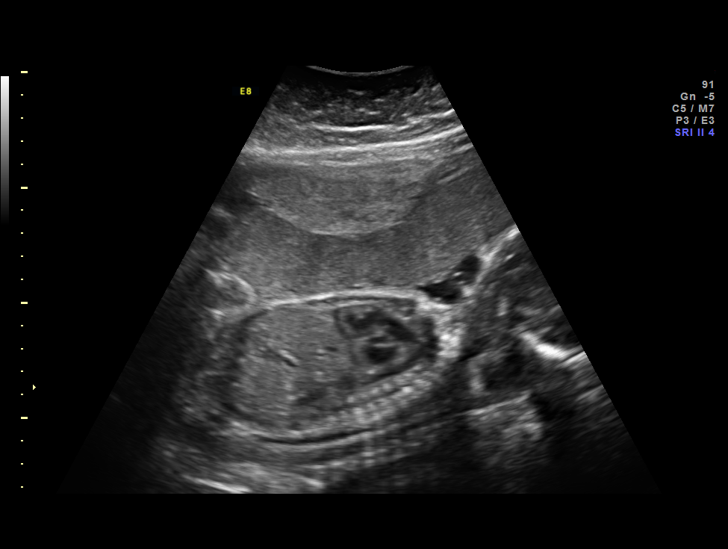
[im 7/59]
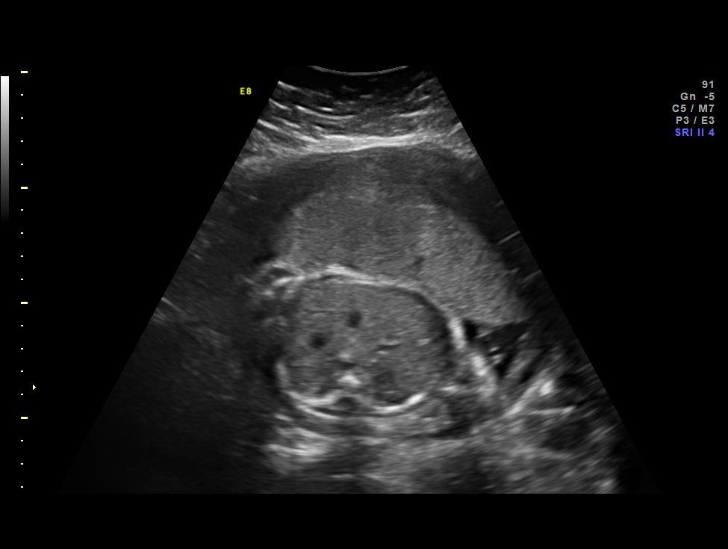
[im 11/59]
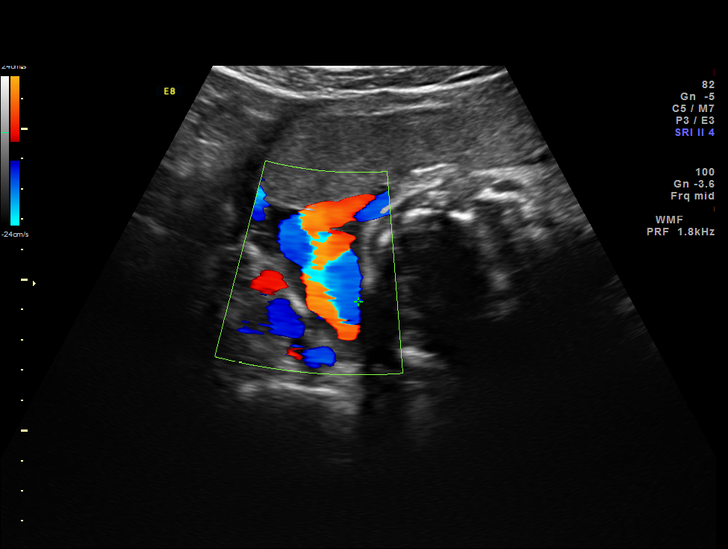
[im 18/59]
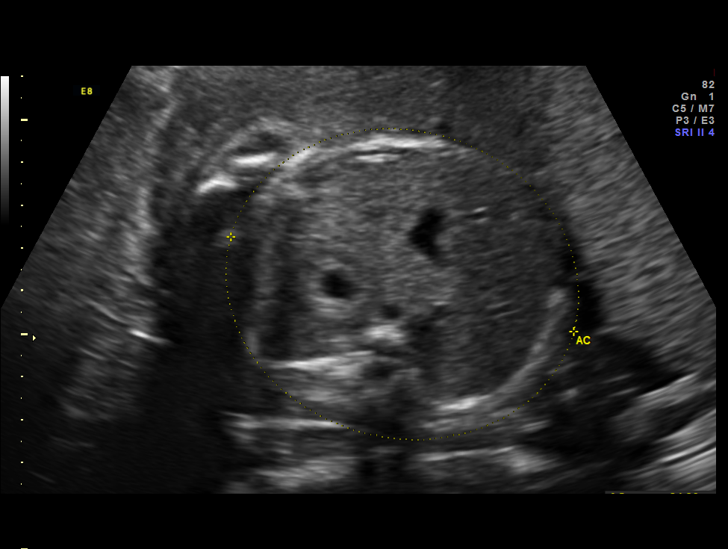
[im 22/59]
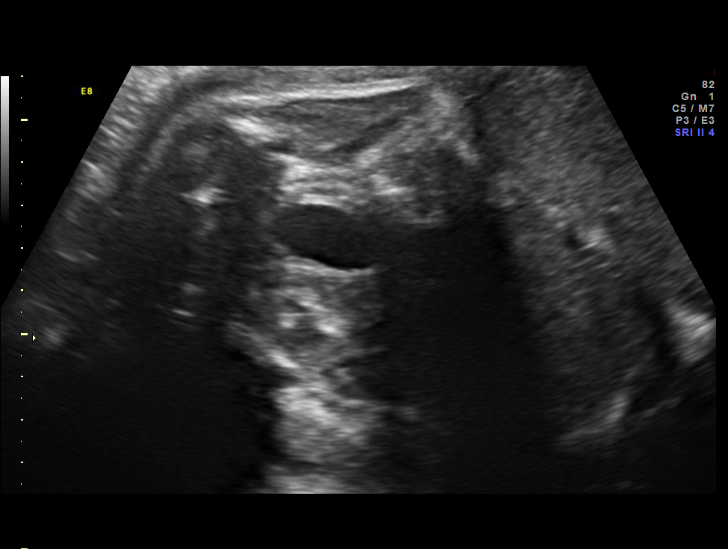
[im 26/59]
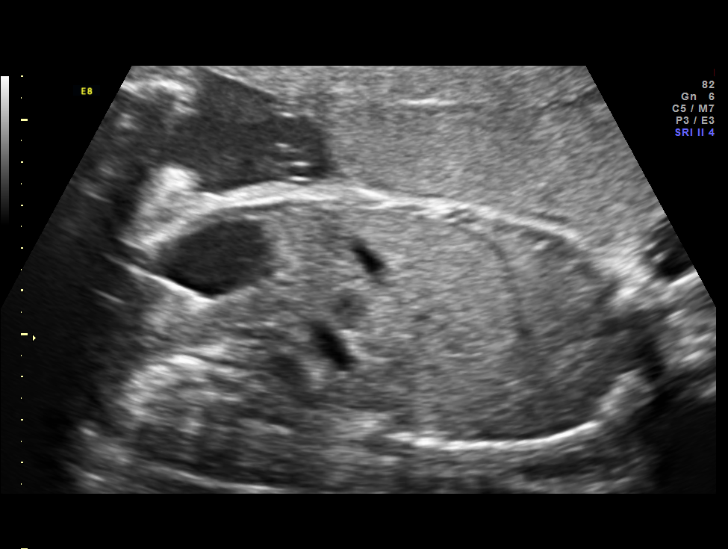
[im 33/59]
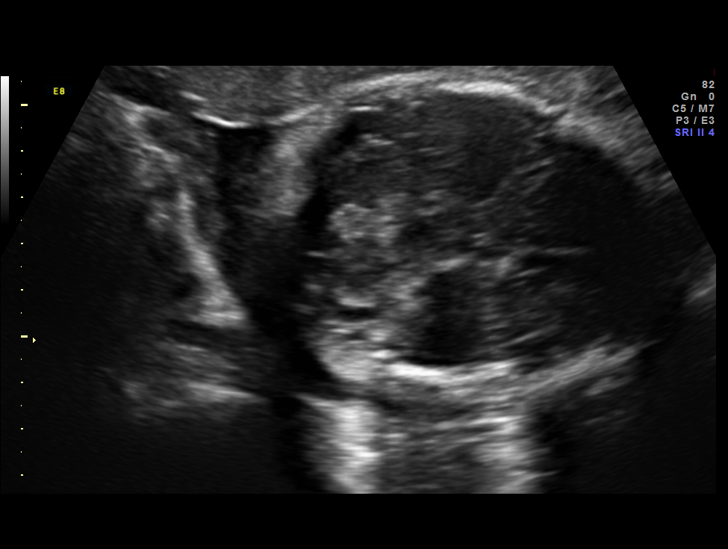
[im 37/59]
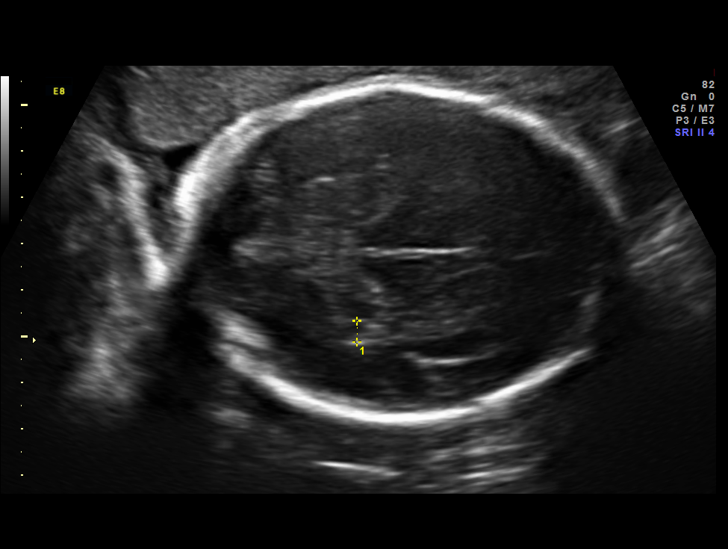
[im 41/59]
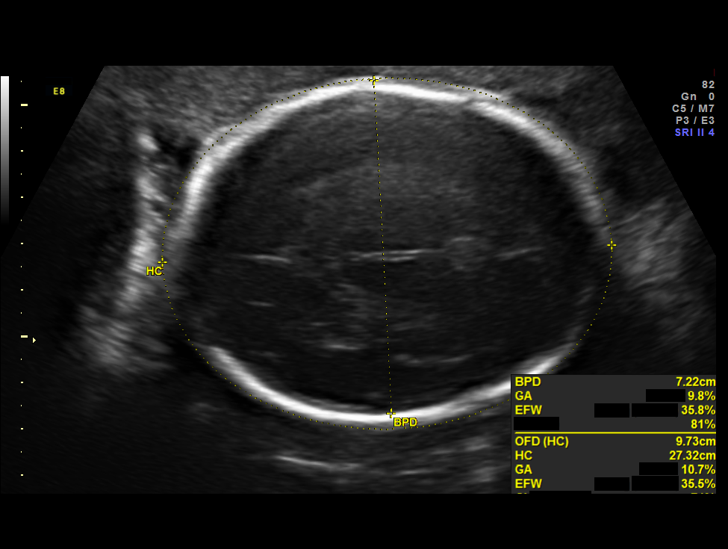
[im 48/59]
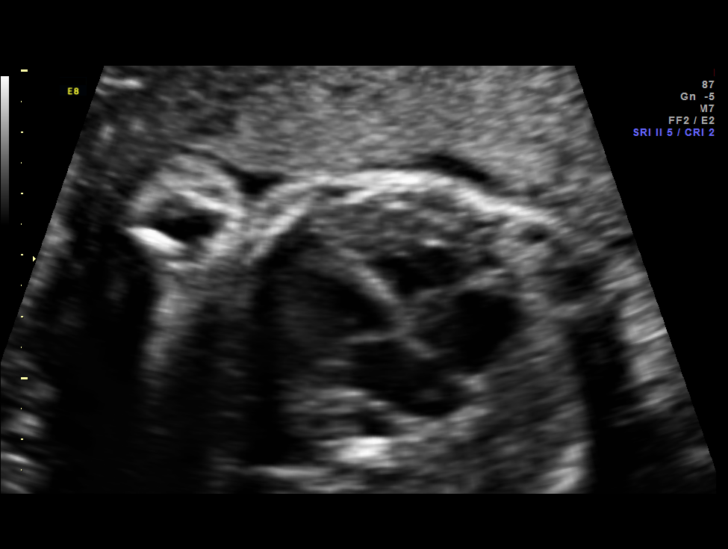
[im 52/59]
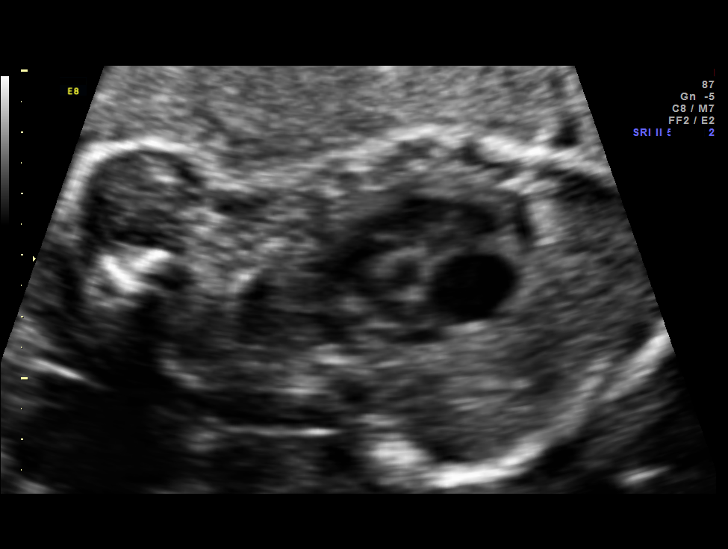
[im 56/59]
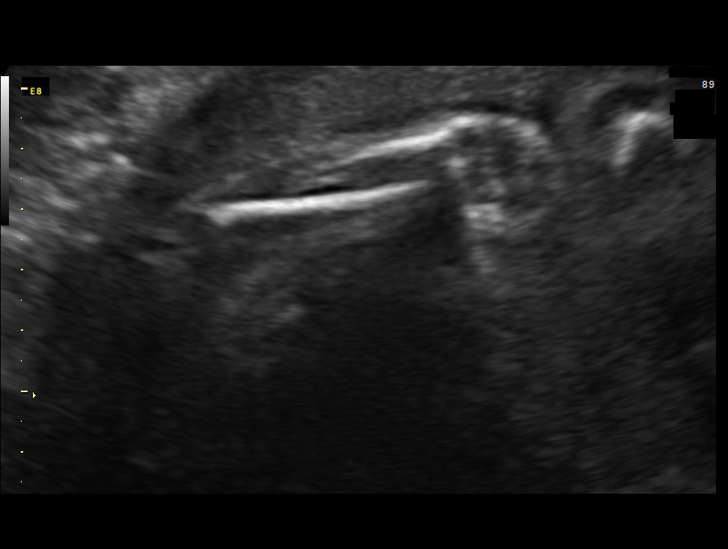

[12 of 28 positions shown; findings below may reference images not displayed]

OBSTETRICS REPORT
                      (Signed Final 09/03/2012 [DATE])

Service(s) Provided

 US OB FOLLOW UP                                       76816.1
Indications

 Premature rupture of membranes - leaking fluid
 Cervical shortening (S/P cerclage)
 Fetal Pyelectasis
 Obesity
 Previous cervical surgery (colposcopy)
 Cigarette smoker
 Assess fetal well being
 Assess Fetal Growth / Estimated Fetal Weight
Fetal Evaluation

 Num Of Fetuses:    1
 Fetal Heart Rate:  130                          bpm
 Cardiac Activity:  Observed
 Presentation:      Cephalic
 Placenta:          Anterior, above cervical os
 P. Cord            Previously Visualized
 Insertion:

 Amniotic Fluid
 AFI FV:      Subjectively decreased
 AFI Sum:     6.08    cm      < 3  %Tile     Larg Pckt:    3.14  cm
 RUQ:   3.14    cm   LUQ:    1.41   cm    LLQ:   1.53    cm
Biophysical Evaluation

 Amniotic F.V:   Within normal limits       F. Tone:        Observed
 F. Movement:    Observed                   Score:          [DATE]
 F. Breathing:   Not Observed
Biometry

 BPD:     71.7  mm     G. Age:  28w 5d                CI:         73.2   70 - 86
 OFD:       98  mm                                    FL/HC:      21.2   19.2 -

 HC:       273  mm     G. Age:  29w 6d       11  %    HC/AC:      1.13   0.99 -

 AC:     241.8  mm     G. Age:  28w 3d        8  %    FL/BPD:     80.9   71 - 87
 FL:        58  mm     G. Age:  30w 2d       41  %    FL/AC:      24.0   20 - 24
 HUM:     51.9  mm     G. Age:  30w 2d       56  %

 Est. FW:    0349  gm           3 lb     35  %
Gestational Age

 LMP:           33w 4d        Date:  01/12/12                 EDD:   10/18/12
 U/S Today:     29w 2d                                        EDD:   11/17/12
 Best:          30w 1d     Det. By:  Early Ultrasound         EDD:   11/11/12
                                     (04/05/12)
Anatomy

 Cranium:          Appears normal         Aortic Arch:      Not well visualized
 Fetal Cavum:      Appears normal         Ductal Arch:      Appears normal
 Ventricles:       Appears normal         Diaphragm:        Appears normal
 Choroid Plexus:   Appears normal         Stomach:          Appears normal, left
                                                            sided
 Cerebellum:       Appears normal         Abdomen:          Appears normal
 Posterior Fossa:  Appears normal         Abdominal Wall:   Previously seen
 Nuchal Fold:      Not applicable (>20    Cord Vessels:     Appears normal (3
                   wks GA)                                  vessel cord)
 Face:             Orbits and profile     Kidneys:          Right sided
                   previously seen
                                                            pyelectasis,6.0 mm
 Lips:             Previously seen        Bladder:          Appears normal
 Palate:           Not well visualized    Spine:            Not well visualized
 Heart:            Appears normal         Lower             Not well visualized
                   (4CH, axis, and        Extremities:
                   situs)
 RVOT:             Appears normal         Upper             Appears normal
                                          Extremities:
 LVOT:             Appears normal

 Other:  Female gender.
Cervix Uterus Adnexa

 Cervix:       Not visualized (advanced GA >72wks)
Impression

 Single IUP at 30 [DATE] weeks
 PPROM
 Mild right renal pylectasis (6 mm) without calyeceal dilation
 Fetal growth is appropriate (35th %tile)
 Subjectively decreased amniotic fluid volume
 BPP [DATE] (-2 for absent breathing movement)
Recommendations

 Continue inpatient management.
 2x weekly BPPs.

 questions or concerns.

## 2013-12-08 ENCOUNTER — Emergency Department (INDEPENDENT_AMBULATORY_CARE_PROVIDER_SITE_OTHER)
Admission: EM | Admit: 2013-12-08 | Discharge: 2013-12-08 | Disposition: A | Discharge status: Home / Self Care | Payer: Self-pay | Source: Home / Self Care | Attending: Emergency Medicine | Admitting: Emergency Medicine

## 2013-12-08 ENCOUNTER — Encounter (HOSPITAL_COMMUNITY): Payer: Self-pay | Admitting: Emergency Medicine

## 2013-12-08 DIAGNOSIS — L0390 Cellulitis, unspecified: Secondary | ICD-10-CM

## 2013-12-08 DIAGNOSIS — L0291 Cutaneous abscess, unspecified: Secondary | ICD-10-CM

## 2013-12-08 MED ORDER — HYDROCODONE-ACETAMINOPHEN 5-325 MG PO TABS: ORAL_TABLET | ORAL | Status: DC

## 2013-12-08 MED ORDER — SULFAMETHOXAZOLE-TMP DS 800-160 MG PO TABS: 2.0000 | ORAL_TABLET | Freq: Two times a day (BID) | ORAL | Status: DC

## 2013-12-08 NOTE — ED Provider Notes (Signed)
  Chief Complaint   Chief Complaint  Patient presents with  . 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 She's had a two-day history of a painful, tender, raised, abscess on her left flank area. She denies any drainage or fever at home. She's had no prior history of skin infections, abscesses, or MRSA. She does not have diabetes.  Review of Systems   Other than as noted above, the patient denies any of the following symptoms: Systemic:  No fever, chills or sweats. Skin:  No rash or itching.  PMFSH   Past medical history, family history, social history, meds, and allergies were reviewed.  No history of diabetes or prior history of abscesses or MRSA.  Physical Examination     Vital signs:  BP 140/80  Pulse 80  Temp(Src) 99.8 F (37.7 C) (Oral)  Resp 28  SpO2 100% Skin:  She has a raised, red, tender, indurated area on her left flank measuring 3 x 4 cm. There was no fluctuance or drainage.  Skin exam was otherwise normal.  No rash. Ext:  Distal pulses were full, patient has full ROM of all joints.  Procedure   Verbal informed consent was obtained.  The patient was informed of the risks and benefits of the procedure and understands and accepts.  A time out was called and the identity of the patient and correct procedure was confirmed.   The abscess area described above was prepped with Betadine and alcohol and anesthetized with 5 mL of 2% Xylocaine with epinephrine.  Using a #11 scalpel blade, a singe straight incision was made into the area of fluctulence, yielding a small amount of prurulent drainage.  Routine cultures were obtained.  Blunt dissection was used to break up loculations and the resulting wound cavity was packed with 1/4 inch Iodoform gauze.  A sterile pressure dressing was applied.  Assessment   The encounter diagnosis was Abscess.  Plan     1.  Meds:  The following meds were prescribed:    New Prescriptions   HYDROCODONE-ACETAMINOPHEN (NORCO/VICODIN) 5-325 MG PER TABLET    1 to 2 tabs every 4 to 6 hours as needed for pain.   SULFAMETHOXAZOLE-TRIMETHOPRIM (BACTRIM DS) 800-160 MG PER TABLET    Take 2 tablets by mouth 2 (two) times daily.    2.  Patient Education/Counseling:  The patient was given appropriate handouts, self care instructions, and instructed in symptomatic relief.  3.  Follow up:  The patient was instructed to leave the dressing in place and return here again in 48 hours for packing removal, or sooner if becoming worse in any way, and given some red flag symptoms such as fever which would prompt immediate return.       Reuben Likesavid C Percival Glasheen, MD 12/08/13 1145

## 2013-12-08 NOTE — Discharge Instructions (Signed)

## 2013-12-08 NOTE — ED Notes (Signed)
Pt  Reports  Symptoms  Of a  Large  Swollen  Area  To  l  Flank    That is  Tender  To the  Touch  Red  And  Swollen  Started  Off  As  A  Small pimple     sev  Days  Ago

## 2013-12-10 ENCOUNTER — Encounter (HOSPITAL_COMMUNITY): Payer: Self-pay | Admitting: Emergency Medicine

## 2013-12-10 ENCOUNTER — Emergency Department (INDEPENDENT_AMBULATORY_CARE_PROVIDER_SITE_OTHER)
Admission: EM | Admit: 2013-12-10 | Discharge: 2013-12-10 | Disposition: A | Discharge status: Home / Self Care | Payer: Self-pay | Source: Home / Self Care | Attending: Family Medicine | Admitting: Family Medicine

## 2013-12-10 DIAGNOSIS — L0291 Cutaneous abscess, unspecified: Secondary | ICD-10-CM

## 2013-12-10 DIAGNOSIS — Z4800 Encounter for change or removal of nonsurgical wound dressing: Secondary | ICD-10-CM

## 2013-12-10 DIAGNOSIS — L0390 Cellulitis, unspecified: Secondary | ICD-10-CM

## 2013-12-10 LAB — CULTURE, ROUTINE-ABSCESS

## 2013-12-10 NOTE — Discharge Instructions (Signed)
Thank you for coming in today. Please return in 2 days Continue the antibiotics Come back sooner if needed.  Abscess Care After An abscess (also called a boil or furuncle) is an infected area that contains a collection of pus. Signs and symptoms of an abscess include pain, tenderness, redness, or hardness, or you may feel a moveable soft area under your skin. An abscess can occur anywhere in the body. The infection may spread to surrounding tissues causing cellulitis. A cut (incision) by the surgeon was made over your abscess and the pus was drained out. Gauze may have been packed into the space to provide a drain that will allow the cavity to heal from the inside outwards. The boil may be painful for 5 to 7 days. Most people with a boil do not have high fevers. Your abscess, if seen early, may not have localized, and may not have been lanced. If not, another appointment may be required for this if it does not get better on its own or with medications. HOME CARE INSTRUCTIONS   Only take over-the-counter or prescription medicines for pain, discomfort, or fever as directed by your caregiver.  When you bathe, soak and then remove gauze or iodoform packs at least daily or as directed by your caregiver. You may then wash the wound gently with mild soapy water. Repack with gauze or do as your caregiver directs. SEEK IMMEDIATE MEDICAL CARE IF:   You develop increased pain, swelling, redness, drainage, or bleeding in the wound site.  You develop signs of generalized infection including muscle aches, chills, fever, or a general ill feeling.  An oral temperature above 102 F (38.9 C) develops, not controlled by medication. See your caregiver for a recheck if you develop any of the symptoms described above. If medications (antibiotics) were prescribed, take them as directed. Document Released: 06/01/2005 Document Revised: 02/05/2012 Document Reviewed: 01/27/2008 [facility] Patient Information 2014  [place].

## 2013-12-10 NOTE — ED Provider Notes (Signed)
Sarah Sutton is a 23 y.o. female who presents to Urgent Care today for followup left flank skin abscess. Patient was seen on January 12 for an abscess. She had incision and drainage. Additionally she was prescribed Bactrim and Norco. She notes some itching that she attributes to the hydrocodone. She has been taking the Bactrim as well. She notes the pain has persisted. She denies any fevers or chills nausea vomiting or diarrhea. She feels well otherwise.  Abscess culture is growing MRSA sensitive to Bactrim.  Past Medical History  Diagnosis Date  . Polycystic ovarian syndrome 2009    by Dr. Gaynell FaceMarshall  . Diabetes mellitus     PER PT BORDERLINE  . Anemia     NO MEDS IN PAST  . Heart murmur     AS A CHILD  . Infection     UTI;NOT FREQ  . Infection     YEAST INF;NOT FREQ  . Infection     BV X 1;CURRENTLY TAKING FLAGYL FOR EX  . Abnormal Pap smear 2007    COLPO DONE;LAST PAP 03/2011 WAS NORMAL  . NSVD (normal spontaneous vaginal delivery) 09/30/2012    Preterm delivery at 34 weeks; IOL for PPROM   History  Substance Use Topics  . Smoking status: Former Smoker -- 0.25 packs/day    Types: Cigarettes    Quit date: 03/14/2012  . Smokeless tobacco: Never Used     Comment: smoked marijuana x2 since finding out she was pregnant for nausea  . Alcohol Use: No   ROS as above Medications: No current facility-administered medications for this encounter.   Current Outpatient Prescriptions  Medication Sig Dispense Refill  . HYDROcodone-acetaminophen (NORCO/VICODIN) 5-325 MG per tablet 1 to 2 tabs every 4 to 6 hours as needed for pain.  20 tablet  0  . sulfamethoxazole-trimethoprim (BACTRIM DS) 800-160 MG per tablet Take 2 tablets by mouth 2 (two) times daily.  40 tablet  0    Exam:  BP 118/60  Pulse 89  Temp(Src) 99 F (37.2 C) (Oral)  Resp 16  SpO2 100% Gen: Well NAD Skin: Well-appearing incision with white to yellow pus expressible. There is a nearby area of induration without  fluctuance. This is tender to touch.  The packing material was removed and a wooden Q-tip was used to break up loculations and a further amount of pus was expressed. The wound is loosely packed with 4 inches of quarter inch packing material.    Assessment and Plan: 23 y.o. female with abscess follow up.  The abscess is doing well however there is a nearby area of induration that is not yet drainable. This has developed despite antibiotics. I will ask the patient to return in 2 days for packing material. Additionally if the indurated area becomes fluctuant that abscess can be drained as well however it is not drainable currently . Continue Bactrim .  Discussed warning signs or symptoms. Please see discharge instructions. Patient expresses understanding.    Rodolph BongEvan S Corey, MD 12/10/13 1002

## 2013-12-10 NOTE — ED Notes (Signed)
Wound check; MD eval only

## 2013-12-14 ENCOUNTER — Telehealth (HOSPITAL_COMMUNITY): Payer: Self-pay | Admitting: *Deleted

## 2013-12-14 NOTE — ED Notes (Signed)
Abscess culture L flank:  Mod. MRSA. Pt. adequately treated with I and D and Bactrim DS. I called and left a message to call. Vassie MoselleYork, Jannel Lynne M 12/14/2013

## 2013-12-15 NOTE — ED Notes (Signed)
Pt. called back.   Pt. verified x 2 and given results.  Pt. told she was adequately treated with Bactrim DS.  [facility] MRSA instructions with her. Pt.'s questions answered.  She said the new one that came up is getting smaller with the antibiotics. Vassie MoselleYork, Skyelynn Rambeau M 12/15/2013

## 2013-12-16 ENCOUNTER — Telehealth: Payer: Self-pay

## 2013-12-16 NOTE — Telephone Encounter (Signed)
Pt was seen in Urgent care for staph infection [facility]  [facility] the [facility] and has been back for one f/u.  Pt would like advice as to [facility] or [facility]. Please f/u with pt.

## 2013-12-16 NOTE — Telephone Encounter (Signed)
Spoke with pt regarding concerns. Pt wants to come in to assess staph wound without establishing care. Pt informed we will treat her but needs to schedule new pt appt. Verbalized understanding

## 2014-04-27 ENCOUNTER — Ambulatory Visit (HOSPITAL_COMMUNITY)
Admission: RE | Admit: 2014-04-27 | Discharge: 2014-04-27 | Disposition: A | Discharge status: Home / Self Care | Payer: Self-pay | Attending: Psychiatry | Admitting: Psychiatry

## 2014-04-27 NOTE — BH Assessment (Signed)
Assessment Note Sarah Sutton is an 23 y.o. female that presents to [facility] stating that she was having suicidal thoughts 2-3 days ago. She realizes that help is needed at this time as her thoughts were not normal for her. She denies having a suicidal plan. Sts that 6 months ago she broke up with her boyfriend of 8 yrs. They were living together and after the break up she decided to move out on her own with her 47 month daughter. She works a full time and feels overwhelmed balancing her job and role as a single mother. She spent her weekend with friends stating she now feels better but wants to see a therapist/psychiatrist. Sts that she grew up in the foster system attending therapy 2x's a week as a child. She appears to have great insight and judgement as she recognizes from her childhood therapy that as a adult she needs to entertain therapy again. stating Patient denies HI and AVH's. She denies alcohol and drug use. Patient is alert and oriented.   Writer discussed clinicals with Renata Caprice, NP and he recommended that patient signs a contract for safety and follows up with outpatient therapy. Writer provided patient with a list of referrals to [facility], [facility] [place], and [facility].   Axis I: Depressive Disorder Nos Axis II: Deferred Axis III:  Past Medical History  Diagnosis Date  . Polycystic ovarian syndrome 2009    by Dr. Gaynell Face  . Diabetes mellitus     PER PT BORDERLINE  . Anemia     NO MEDS IN PAST  . Heart murmur     AS A CHILD  . Infection     UTI;NOT FREQ  . Infection     YEAST INF;NOT FREQ  . Infection     BV X 1;CURRENTLY TAKING FLAGYL FOR EX  . Abnormal Pap smear 2007    COLPO DONE;LAST PAP 03/2011 WAS NORMAL  . NSVD (normal spontaneous vaginal delivery) 09/30/2012    Preterm delivery at 34 weeks; IOL for PPROM   Axis IV: other psychosocial or environmental problems, problems related to social environment, problems with access to health care services  and problems with primary support group Axis V: 51-60 moderate symptoms  Past Medical History:  Past Medical History  Diagnosis Date  . Polycystic ovarian syndrome 2009    by Dr. Gaynell Face  . Diabetes mellitus     PER PT BORDERLINE  . Anemia     NO MEDS IN PAST  . Heart murmur     AS A CHILD  . Infection     UTI;NOT FREQ  . Infection     YEAST INF;NOT FREQ  . Infection     BV X 1;CURRENTLY TAKING FLAGYL FOR EX  . Abnormal Pap smear 2007    COLPO DONE;LAST PAP 03/2011 WAS NORMAL  . NSVD (normal spontaneous vaginal delivery) 09/30/2012    Preterm delivery at 34 weeks; IOL for PPROM    Past Surgical History  Procedure Laterality Date  . Cholecystectomy  november 2009  . Colposcopy  2009  . Wisdom tooth extraction      ALL 4 REMOVED  . Cervical cerclage  07/01/2012    Procedure: CERCLAGE CERVICAL;  Surgeon: Esmeralda Arthur, MD;  Location: [facility];  Service: Gynecology;  Laterality: N/A;    Family History:  Family History  Problem Relation Age of Onset  . Asthma Mother   . Diabetes Father   . Hypertension Father   . Cancer Maternal  Grandmother     bone cancer  . Cancer Paternal Grandmother     breast cancer    Social History:  reports that she quit smoking about 2 years ago. Her smoking use included Cigarettes. She smoked 0.25 packs per day. She has never used smokeless tobacco. She reports that she does not drink alcohol or use illicit drugs.  Additional Social History:  Alcohol / Drug Use Pain Medications: SEE MAR Prescriptions: SEE MAR Over the Counter: SEE MAR  CIWA:   COWS:    Allergies: No Known Allergies  Home Medications:  (Not in a hospital admission)  OB/GYN Status:  No LMP recorded. Patient is not currently having periods (Reason: Other).  General Assessment Data Location of Assessment: [facility] Is this a Tele or Face-to-Face Assessment?: Face-to-Face Is this an Initial Assessment or a Re-assessment for this encounter?: Initial  Assessment Living Arrangements: Other (Comment) Can pt return to current living arrangement?: Yes Admission Status: Voluntary Is patient capable of signing voluntary admission?: Yes Transfer from: Acute Hospital Referral Source: Self/Family/Friend     [facility] Crisis Care Plan Living Arrangements: Other (Comment) Name of Psychiatrist:  (No psychiatrist ) Name of Therapist:  (No therapist )  Education Status Is patient currently in school?: No  Risk to self Suicidal Ideation: No-Not Currently/Within Last 6 Months Suicidal Intent: No Is patient at risk for suicide?: No Suicidal Plan?: No Access to Means: No What has been your use of drugs/alcohol within the last 12 months?:  (n/a) Previous Attempts/Gestures: No How many times?:  (0) Other Self Harm Risks:  (none reported ) Triggers for Past Attempts: Other (Comment) (none reported ) Intentional Self Injurious Behavior: None Family Suicide History: Unknown Recent stressful life event(s): Other (Comment) (single mother,break up w/ boyfriend of 8 yrs, etc. ) Persecutory voices/beliefs?: No Depression: Yes Depression Symptoms: Feeling angry/irritable;Feeling worthless/self pity;Loss of interest in usual pleasures;Fatigue;Isolating;Tearfulness Substance abuse history and/or treatment for substance abuse?: No Suicide prevention information given to non-admitted patients: Not applicable  Risk to Others Homicidal Ideation: No Thoughts of Harm to Others: No Current Homicidal Intent: No Current Homicidal Plan: No Access to Homicidal Means: No Identified Victim:  (n/a) History of harm to others?: No Assessment of Violence: None Noted Violent Behavior Description:  (Patient is calm and cooperative ) Does patient have access to weapons?: No Criminal Charges Pending?: No Does patient have a court date: No  Psychosis Hallucinations: None noted Delusions: None noted  Mental Status Report Appear/Hygiene: Disheveled Eye Contact:  Good Motor Activity: Freedom of movement Speech: Logical/coherent Level of Consciousness: Alert Mood: Depressed Affect: Appropriate to circumstance Anxiety Level: Minimal Thought Processes: Coherent;Relevant Judgement: Unimpaired Orientation: Person;Place;Time;Situation Obsessive Compulsive Thoughts/Behaviors: None  Cognitive Functioning Concentration: Decreased Memory: Recent Intact;Remote Intact IQ: Average Insight: Good Impulse Control: Good Appetite: Poor Weight Loss:  (40 pounds in 6 months ) Weight Gain:  (none reported ) Sleep: Decreased Total Hours of Sleep:  (none reported ) Vegetative Symptoms: None  ADLScreening [facility] Assessment Services) Patient's cognitive ability adequate to safely complete daily activities?: Yes Patient able to express need for assistance with ADLs?: Yes Independently performs ADLs?: Yes (appropriate for developmental age)  Prior Inpatient Therapy Prior Inpatient Therapy: Yes Prior Therapy Dates:  (child hood ) Prior Therapy Facilty/Provider(s):  ([facility]) Reason for Treatment:  (depression and suicidal thoughts )  Prior Outpatient Therapy Prior Outpatient Therapy: No Prior Therapy Dates:  (n/a) Prior Therapy Facilty/Provider(s):  (n/a) Reason for Treatment:  (n/a)  ADL Screening (condition at time of admission)  Patient's cognitive ability adequate to safely complete daily activities?: Yes Is the patient deaf or have difficulty hearing?: No Does the patient have difficulty seeing, even when wearing glasses/contacts?: No Does the patient have difficulty concentrating, remembering, or making decisions?: No Patient able to express need for assistance with ADLs?: Yes Does the patient have difficulty dressing or bathing?: No Independently performs ADLs?: Yes (appropriate for developmental age) Does the patient have difficulty walking or climbing stairs?: No Weakness of Legs: None Weakness of Arms/Hands: None  Home Assistive  Devices/Equipment Home Assistive Devices/Equipment: None    Abuse/Neglect Assessment (Assessment to be complete while patient is alone) Physical Abuse: Denies Verbal Abuse: Denies Sexual Abuse: Denies Exploitation of patient/patient's resources: Denies Self-Neglect: Denies Values / Beliefs Cultural Requests During Hospitalization: None Spiritual Requests During Hospitalization: None   Advance Directives (For Healthcare) Advance Directive: Patient does not have advance directive Nutrition Screen- [facility] Adult/[facility]/AP Patient's home diet: Regular  Additional Information 1:1 In Past 12 Months?: No CIRT Risk: No Elopement Risk: No Does patient have medical clearance?: Yes     Disposition:  Disposition Initial Assessment Completed for this Encounter: Yes Disposition of Patient: Outpatient treatment;[facility] Type of outpatient treatment: Adult Other disposition(s): Referred to outside facility Patient referred to: Outpatient clinic referral  On Site Evaluation by:   Reviewed with Physician:    Octaviano Battyoyka Mona Jehiel Koepp 04/27/2014 6:59 PM

## 2014-09-28 ENCOUNTER — Encounter (HOSPITAL_COMMUNITY): Payer: Self-pay | Admitting: Emergency Medicine

## 2015-06-21 ENCOUNTER — Emergency Department (HOSPITAL_BASED_OUTPATIENT_CLINIC_OR_DEPARTMENT_OTHER): Payer: Medicaid Other

## 2015-06-21 ENCOUNTER — Emergency Department (HOSPITAL_BASED_OUTPATIENT_CLINIC_OR_DEPARTMENT_OTHER)
Admission: EM | Admit: 2015-06-21 | Discharge: 2015-06-21 | Disposition: A | Discharge status: Home / Self Care | Payer: Medicaid Other | Attending: Emergency Medicine | Admitting: Emergency Medicine

## 2015-06-21 ENCOUNTER — Encounter (HOSPITAL_BASED_OUTPATIENT_CLINIC_OR_DEPARTMENT_OTHER): Payer: Self-pay | Admitting: *Deleted

## 2015-06-21 DIAGNOSIS — Z87440 Personal history of urinary (tract) infections: Secondary | ICD-10-CM | POA: Diagnosis not present

## 2015-06-21 DIAGNOSIS — R011 Cardiac murmur, unspecified: Secondary | ICD-10-CM | POA: Insufficient documentation

## 2015-06-21 DIAGNOSIS — R110 Nausea: Secondary | ICD-10-CM | POA: Diagnosis not present

## 2015-06-21 DIAGNOSIS — Z8639 Personal history of other endocrine, nutritional and metabolic disease: Secondary | ICD-10-CM | POA: Insufficient documentation

## 2015-06-21 DIAGNOSIS — R1084 Generalized abdominal pain: Secondary | ICD-10-CM | POA: Diagnosis present

## 2015-06-21 DIAGNOSIS — Z87891 Personal history of nicotine dependence: Secondary | ICD-10-CM | POA: Diagnosis not present

## 2015-06-21 DIAGNOSIS — Z331 Pregnant state, incidental: Secondary | ICD-10-CM | POA: Insufficient documentation

## 2015-06-21 DIAGNOSIS — R109 Unspecified abdominal pain: Secondary | ICD-10-CM

## 2015-06-21 DIAGNOSIS — O26899 Other specified pregnancy related conditions, unspecified trimester: Secondary | ICD-10-CM

## 2015-06-21 DIAGNOSIS — Z862 Personal history of diseases of the blood and blood-forming organs and certain disorders involving the immune mechanism: Secondary | ICD-10-CM | POA: Diagnosis not present

## 2015-06-21 DIAGNOSIS — R0602 Shortness of breath: Secondary | ICD-10-CM | POA: Diagnosis not present

## 2015-06-21 DIAGNOSIS — Z9049 Acquired absence of other specified parts of digestive tract: Secondary | ICD-10-CM | POA: Diagnosis not present

## 2015-06-21 DIAGNOSIS — Z9889 Other specified postprocedural states: Secondary | ICD-10-CM | POA: Insufficient documentation

## 2015-06-21 DIAGNOSIS — R1033 Periumbilical pain: Secondary | ICD-10-CM | POA: Diagnosis not present

## 2015-06-21 DIAGNOSIS — Z792 Long term (current) use of antibiotics: Secondary | ICD-10-CM | POA: Insufficient documentation

## 2015-06-21 DIAGNOSIS — R1030 Lower abdominal pain, unspecified: Secondary | ICD-10-CM | POA: Diagnosis not present

## 2015-06-21 DIAGNOSIS — N898 Other specified noninflammatory disorders of vagina: Secondary | ICD-10-CM | POA: Insufficient documentation

## 2015-06-21 DIAGNOSIS — Z8739 Personal history of other diseases of the musculoskeletal system and connective tissue: Secondary | ICD-10-CM | POA: Insufficient documentation

## 2015-06-21 DIAGNOSIS — Z3490 Encounter for supervision of normal pregnancy, unspecified, unspecified trimester: Secondary | ICD-10-CM

## 2015-06-21 LAB — BASIC METABOLIC PANEL
Anion gap: 5 (ref 5–15)
BUN: 6 mg/dL (ref 6–20)
CALCIUM: 8.9 mg/dL (ref 8.9–10.3)
CHLORIDE: 106 mmol/L (ref 101–111)
CO2: 25 mmol/L (ref 22–32)
Creatinine, Ser: 0.67 mg/dL (ref 0.44–1.00)
GFR calc non Af Amer: >60 mL/min (ref >60)
Glucose, Bld: 103 mg/dL — ABNORMAL HIGH (ref 65–99)
POTASSIUM: 3.6 mmol/L (ref 3.5–5.1)
Sodium: 136 mmol/L (ref 135–145)

## 2015-06-21 LAB — CBC WITH DIFFERENTIAL/PLATELET
BASOS ABS: 0 10*3/uL (ref 0.0–0.1)
BASOS PCT: 0 % (ref 0–1)
EOS ABS: 0.2 10*3/uL (ref 0.0–0.7)
Eosinophils Relative: 2 % (ref 0–5)
HEMATOCRIT: 39.5 % (ref 36.0–46.0)
HEMOGLOBIN: 13.6 g/dL (ref 12.0–15.0)
LYMPHS PCT: 37 % (ref 12–46)
Lymphs Abs: 3.6 10*3/uL (ref 0.7–4.0)
MCH: 30.1 pg (ref 26.0–34.0)
MCHC: 34.4 g/dL (ref 30.0–36.0)
MCV: 87.4 fL (ref 78.0–100.0)
MONO ABS: 1.2 10*3/uL — AB (ref 0.1–1.0)
Monocytes Relative: 12 % (ref 3–12)
NEUTROS PCT: 49 % (ref 43–77)
Neutro Abs: 4.6 10*3/uL (ref 1.7–7.7)
Platelets: 288 10*3/uL (ref 150–400)
RBC: 4.52 MIL/uL (ref 3.87–5.11)
RDW: 13.1 % (ref 11.5–15.5)
WBC: 9.6 10*3/uL (ref 4.0–10.5)

## 2015-06-21 LAB — URINALYSIS, ROUTINE W REFLEX MICROSCOPIC
BILIRUBIN URINE: NEGATIVE
Glucose, UA: NEGATIVE mg/dL
HGB URINE DIPSTICK: NEGATIVE
Ketones, ur: 15 mg/dL — AB
Leukocytes, UA: NEGATIVE
NITRITE: NEGATIVE
PH: 8 (ref 5.0–8.0)
Protein, ur: NEGATIVE mg/dL
Specific Gravity, Urine: 1.034 — ABNORMAL HIGH (ref 1.005–1.030)
UROBILINOGEN UA: 0.2 mg/dL (ref 0.0–1.0)

## 2015-06-21 LAB — WET PREP, GENITAL
CLUE CELLS WET PREP: NONE SEEN
TRICH WET PREP: NONE SEEN
YEAST WET PREP: NONE SEEN

## 2015-06-21 LAB — PREGNANCY, URINE: PREG TEST UR: POSITIVE — AB

## 2015-06-21 NOTE — ED Notes (Signed)
Pt saw her PCP last week for abdominal pain and her examination was normal. She sts that the pain continues. Pt denies N/V but has had some diarrhea. Pt also recently completed a course of antibiotics for bacterial vaginitis

## 2015-06-21 NOTE — ED Provider Notes (Signed)
CSN: [id number]     Arrival date & time 06/21/15  1438 History  This chart was scribed for Blake Divine, MD by Placido Sou, ED scribe. This patient was seen in room MH02/MH02 and the patient's care was started at 3:47 PM.   Chief Complaint  Patient presents with  . Abdominal Pain   Patient is a 24 y.o. female presenting with abdominal pain. The history is provided by the patient. No language interpreter was used.  Abdominal Pain Pain location:  Generalized Pain quality: cramping   Pain radiates to:  Does not radiate Pain severity:  Moderate Onset quality:  Sudden Duration:  4 days Timing:  Constant Progression:  Worsening Chronicity:  New Relieved by:  None tried Worsened by:  Nothing tried Ineffective treatments:  None tried Associated symptoms: nausea, shortness of breath and vaginal discharge   Associated symptoms: no chills, no cough, no fever and no vomiting   Risk factors: pregnancy     HPI Comments: Sarah Sutton is a 24 y.o. female who presents to the Emergency Department complaining of worsening, moderate, central abd pain with onset earlier this month and worsening for the past 4 days. She notes coming to [facility] on 7/13 and being diagnosed with bacterial vaginosis and further notes taking her Rx's and completing them 4 days ago which provided relief for a short period of time until she quit taking the medications. Pt notes associated nausea, cramping, intermittent clear vaginal discharge and SOB. Pt notes mild spotting the first day that should have been her menstrual cycle but denies having her most recent normal cycle after this episode. Spotting was about 2 weeks ago.  None since.  She notes typically having regular menstrual periods since having her daughter 3 years ago. Pt denies vomiting, cough, fever and chills.   Past Medical History  Diagnosis Date  . Polycystic ovarian syndrome 2009    by Dr. Gaynell Face  . Anemia     NO MEDS IN PAST  . Heart murmur     AS A  CHILD  . Infection     UTI;NOT FREQ  . Infection     YEAST INF;NOT FREQ  . Infection     BV X 1;CURRENTLY TAKING FLAGYL FOR EX  . Abnormal Pap smear 2007    COLPO DONE;LAST PAP 03/2011 WAS NORMAL  . NSVD (normal spontaneous vaginal delivery) 09/30/2012    Preterm delivery at 34 weeks; IOL for PPROM   Past Surgical History  Procedure Laterality Date  . Cholecystectomy  november 2009  . Colposcopy  2009  . Wisdom tooth extraction      ALL 4 REMOVED  . Cervical cerclage  07/01/2012    Procedure: CERCLAGE CERVICAL;  Surgeon: Esmeralda Arthur, MD;  Location: [facility];  Service: Gynecology;  Laterality: N/A;   Family History  Problem Relation Age of Onset  . Asthma Mother   . Diabetes Father   . Hypertension Father   . Cancer Maternal Grandmother     bone cancer  . Cancer Paternal Grandmother     breast cancer   History  Substance Use Topics  . Smoking status: Former Smoker -- 0.25 packs/day    Types: Cigarettes    Quit date: 03/14/2012  . Smokeless tobacco: Never Used     Comment: smoked marijuana x2 since finding out she was pregnant for nausea  . Alcohol Use: No   OB History    Gravida Para Term Preterm AB TAB SAB Ectopic Multiple Living   1  1 0 1 0 0 0 0 0 1     Review of Systems  Constitutional: Negative for fever and chills.  Respiratory: Positive for shortness of breath. Negative for cough.   Gastrointestinal: Positive for nausea and abdominal pain. Negative for vomiting.  Genitourinary: Positive for vaginal discharge.  All other systems reviewed and are negative.  Allergies  Review of patient's allergies indicates no known allergies.  Home Medications   Prior to Admission medications   Medication Sig Start Date End Date Taking? Authorizing Provider  HYDROcodone-acetaminophen (NORCO/VICODIN) 5-325 MG per tablet 1 to 2 tabs every 4 to 6 hours as needed for pain. 12/08/13   Reuben Likes, MD  sulfamethoxazole-trimethoprim (BACTRIM DS) 800-160 MG per tablet Take 2  tablets by mouth 2 (two) times daily. 12/08/13   Reuben Likes, MD   BP 147/80 mmHg  Pulse 62  Temp(Src) 98.8 F (37.1 C) (Oral)  Resp 18  Ht 5\' 7"  (1.702 m)  Wt 232 lb (105.235 kg)  BMI 36.33 kg/m2  SpO2 100%  LMP 06/07/2015 Physical Exam  Constitutional: She is oriented to person, place, and time. She appears well-developed and well-nourished. No distress.  HENT:  Head: Normocephalic and atraumatic.  Mouth/Throat: Oropharynx is clear and moist.  Eyes: Conjunctivae are normal. Pupils are equal, round, and reactive to light. No scleral icterus.  Neck: Neck supple.  Cardiovascular: Normal rate, regular rhythm, normal heart sounds and intact distal pulses.   No murmur heard. Pulmonary/Chest: Effort normal and breath sounds normal. No stridor. No respiratory distress. She has no rales.  Abdominal: Soft. Bowel sounds are normal. She exhibits no distension. There is tenderness in the periumbilical area and suprapubic area. There is no rigidity, no rebound and no guarding.  Genitourinary:  Mild white discharge, no cervical friability, no CMT, mild left pelvic tenderness.    Musculoskeletal: Normal range of motion.  Neurological: She is alert and oriented to person, place, and time.  Skin: Skin is warm and dry. No rash noted.  Psychiatric: She has a normal mood and affect. Her behavior is normal.  Nursing note and vitals reviewed.   ED Course  Procedures  DIAGNOSTIC STUDIES: Oxygen Saturation is 100% on RA, normal by my interpretation.    COORDINATION OF CARE: 3:56 PM Discussed treatment plan with pt at bedside and pt agreed to plan.  Labs Review Labs Reviewed  WET PREP, GENITAL - Abnormal; Notable for the following:    WBC, Wet Prep HPF POC MANY (*)    All other components within normal limits  URINALYSIS, ROUTINE W REFLEX MICROSCOPIC (NOT AT [facility]) - Abnormal; Notable for the following:    Specific Gravity, Urine 1.034 (*)    Ketones, ur 15 (*)    All other components within  normal limits  PREGNANCY, URINE - Abnormal; Notable for the following:    Preg Test, Ur POSITIVE (*)    All other components within normal limits  CBC WITH DIFFERENTIAL/PLATELET - Abnormal; Notable for the following:    Monocytes Absolute 1.2 (*)    All other components within normal limits  BASIC METABOLIC PANEL - Abnormal; Notable for the following:    Glucose, Bld 103 (*)    All other components within normal limits   Imaging Review US Ob Comp Less 14 Wks  06/21/2015   CLINICAL DATA:  Abdominal/pelvic pain 2 weeks. Estimated gestational age per LMP 6 weeks 2 days. Quantitative beta HCG not done.  EXAM: OBSTETRIC <14 WK [place] AND TRANSVAGINAL OB US  TECHNIQUE: Both transabdominal and transvaginal ultrasound examinations were performed for complete evaluation of the gestation as well as the maternal uterus, adnexal regions, and pelvic cul-de-sac. Transvaginal technique was performed to assess early pregnancy.  COMPARISON:  None.  FINDINGS: Intrauterine gestational sac: Visualized/normal in shape.  Yolk sac:  Visualized.  Embryo:  Not visualized.  Cardiac Activity: Not visualized.  Heart Rate: Not visualized.  MSD: 6.0  mm   5 w   2  d  [place] EDC: 02/19/2016  Small subchorionic hemorrhage.  Maternal uterus/adnexae: Ovaries within normal. Suggestion of small right corpus luteal cyst. No free pelvic fluid.  IMPRESSION: Findings likely representing a normal early IUP as embryo/ cardiac activity not demonstrated at this time. Estimated gestational age [redacted] weeks 2 days per mean gestational sac diameter. Recommend followup ultrasound 10-14 days.   Electronically Signed   By: Elberta Fortis M.D.   On: 06/21/2015 17:38   US Ob Transvaginal  06/21/2015   CLINICAL DATA:  Abdominal/pelvic pain 2 weeks. Estimated gestational age per LMP 6 weeks 2 days. Quantitative beta HCG not done.  EXAM: OBSTETRIC <14 WK [place] AND TRANSVAGINAL OB US  TECHNIQUE: Both transabdominal and transvaginal ultrasound examinations were performed  for complete evaluation of the gestation as well as the maternal uterus, adnexal regions, and pelvic cul-de-sac. Transvaginal technique was performed to assess early pregnancy.  COMPARISON:  None.  FINDINGS: Intrauterine gestational sac: Visualized/normal in shape.  Yolk sac:  Visualized.  Embryo:  Not visualized.  Cardiac Activity: Not visualized.  Heart Rate: Not visualized.  MSD: 6.0  mm   5 w   2  d  [place] EDC: 02/19/2016  Small subchorionic hemorrhage.  Maternal uterus/adnexae: Ovaries within normal. Suggestion of small right corpus luteal cyst. No free pelvic fluid.  IMPRESSION: Findings likely representing a normal early IUP as embryo/ cardiac activity not demonstrated at this time. Estimated gestational age [redacted] weeks 2 days per mean gestational sac diameter. Recommend followup ultrasound 10-14 days.   Electronically Signed   By: Elberta Fortis M.D.   On: 06/21/2015 17:38     EKG Interpretation None     MDM   Final diagnoses:  Abdominal pain  Pregnancy    Pt was unaware of this unexpected pregnancy.  Having suprapubic pain, will need [place].  She just had STD screening two weeks ago, which she reports was normal.  Finished course of flagyl for BV.    US shows IUP.    I personally performed the services described in this documentation, which was scribed in my presence. The recorded information has been reviewed and is accurate.     Blake Divine, MD 06/21/15 [phone]

## 2015-06-21 NOTE — ED Notes (Signed)
Pt reports pelvic pain x 1 week, seen at [facility] dept last wed for std testing, and was told everything was fine. Pt states she took flagyl for bacterial vaginosis, and felt better, but now the pain is returning.

## 2015-06-21 NOTE — Discharge Instructions (Signed)

## 2015-07-11 ENCOUNTER — Encounter (HOSPITAL_COMMUNITY): Payer: Self-pay

## 2015-07-11 ENCOUNTER — Emergency Department (HOSPITAL_COMMUNITY)
Admission: EM | Admit: 2015-07-11 | Discharge: 2015-07-11 | Disposition: A | Discharge status: Home / Self Care | Payer: Medicaid Other | Attending: Emergency Medicine | Admitting: Emergency Medicine

## 2015-07-11 ENCOUNTER — Emergency Department (HOSPITAL_COMMUNITY): Payer: Medicaid Other

## 2015-07-11 DIAGNOSIS — R011 Cardiac murmur, unspecified: Secondary | ICD-10-CM | POA: Insufficient documentation

## 2015-07-11 DIAGNOSIS — Z79899 Other long term (current) drug therapy: Secondary | ICD-10-CM | POA: Insufficient documentation

## 2015-07-11 DIAGNOSIS — Z87440 Personal history of urinary (tract) infections: Secondary | ICD-10-CM | POA: Insufficient documentation

## 2015-07-11 DIAGNOSIS — Z862 Personal history of diseases of the blood and blood-forming organs and certain disorders involving the immune mechanism: Secondary | ICD-10-CM | POA: Diagnosis not present

## 2015-07-11 DIAGNOSIS — Z87891 Personal history of nicotine dependence: Secondary | ICD-10-CM | POA: Diagnosis not present

## 2015-07-11 DIAGNOSIS — R079 Chest pain, unspecified: Secondary | ICD-10-CM | POA: Diagnosis not present

## 2015-07-11 LAB — BASIC METABOLIC PANEL
ANION GAP: 10 (ref 5–15)
BUN: 5 mg/dL — AB (ref 6–20)
CHLORIDE: 102 mmol/L (ref 101–111)
CO2: 22 mmol/L (ref 22–32)
Calcium: 9.1 mg/dL (ref 8.9–10.3)
Creatinine, Ser: 0.66 mg/dL (ref 0.44–1.00)
GFR calc non Af Amer: >60 mL/min (ref >60)
GLUCOSE: 85 mg/dL (ref 65–99)
POTASSIUM: 3.5 mmol/L (ref 3.5–5.1)
SODIUM: 134 mmol/L — AB (ref 135–145)

## 2015-07-11 LAB — CBC
HEMATOCRIT: 38.5 % (ref 36.0–46.0)
Hemoglobin: 13.6 g/dL (ref 12.0–15.0)
MCH: 30.4 pg (ref 26.0–34.0)
MCHC: 35.3 g/dL (ref 30.0–36.0)
MCV: 86.1 fL (ref 78.0–100.0)
Platelets: 273 10*3/uL (ref 150–400)
RBC: 4.47 MIL/uL (ref 3.87–5.11)
RDW: 12.8 % (ref 11.5–15.5)
WBC: 10.9 10*3/uL — ABNORMAL HIGH (ref 4.0–10.5)

## 2015-07-11 LAB — I-STAT TROPONIN, ED: TROPONIN I, POC: 0 ng/mL (ref 0.00–0.08)

## 2015-07-11 MED ORDER — ACETAMINOPHEN 325 MG PO TABS
650.0000 mg | ORAL_TABLET | Freq: Once | ORAL | Status: AC
  Administered 2015-07-11: 650 mg via ORAL
  Filled 2015-07-11: qty 2

## 2015-07-11 NOTE — ED Provider Notes (Signed)
CSN: [id number]     Arrival date & time 07/11/15  1532 History   First MD Initiated Contact with Patient 07/11/15 1608     Chief Complaint  Patient presents with  . Chest Pain     (Consider location/radiation/quality/duration/timing/severity/associated sxs/prior Treatment) Patient is a 24 y.o. female presenting with chest pain. The history is provided by the patient and medical records.  Chest Pain   This is a 24 year old female with history of anemia, frequent UTIs, PCL last, presenting to the ED for chest pain. Patient states earlier today she developed right-sided chest pressure underneath her right breast. Pain is nonradiating. No associated shortness of breath, diaphoresis, palpitations, nausea, vomiting, dizziness, or lightheadedness. Patient states she does have some stabbing pain with inspiration. She has no known cardiac history. No family cardiac history. Patient was a former smoker, she quit when she found out she was pregnant. No history of DVT or PE. No leg swelling or skin discoloration. She states she was evaluated at [facility] urgent care and sent here for further evaluation.  Patient has no problems related to pregnancy-- no vaginal bleeding, abdominal pain/craming, loss of fluids, nausea, or vomiting.  VSS.  Past Medical History  Diagnosis Date  . Polycystic ovarian syndrome 2009    by Dr. Gaynell Face  . Anemia     NO MEDS IN PAST  . Heart murmur     AS A CHILD  . Infection     UTI;NOT FREQ  . Infection     YEAST INF;NOT FREQ  . Infection     BV X 1;CURRENTLY TAKING FLAGYL FOR EX  . Abnormal Pap smear 2007    COLPO DONE;LAST PAP 03/2011 WAS NORMAL  . NSVD (normal spontaneous vaginal delivery) 09/30/2012    Preterm delivery at 34 weeks; IOL for PPROM   Past Surgical History  Procedure Laterality Date  . Cholecystectomy  november 2009  . Colposcopy  2009  . Wisdom tooth extraction      ALL 4 REMOVED  . Cervical cerclage  07/01/2012    Procedure: CERCLAGE CERVICAL;   Surgeon: Esmeralda Arthur, MD;  Location: [facility];  Service: Gynecology;  Laterality: N/A;   Family History  Problem Relation Age of Onset  . Asthma Mother   . Diabetes Father   . Hypertension Father   . Cancer Maternal Grandmother     bone cancer  . Cancer Paternal Grandmother     breast cancer   Social History  Substance Use Topics  . Smoking status: Former Smoker -- 0.25 packs/day    Types: Cigarettes    Quit date: 03/14/2012  . Smokeless tobacco: Never Used     Comment: smoked marijuana x2 since finding out she was pregnant for nausea  . Alcohol Use: No   OB History    Gravida Para Term Preterm AB TAB SAB Ectopic Multiple Living   1 1 0 1 0 0 0 0 0 1      Review of Systems  Cardiovascular: Positive for chest pain.  All other systems reviewed and are negative.     Allergies  Review of patient's allergies indicates no known allergies.  Home Medications   Prior to Admission medications   Medication Sig Start Date End Date Taking? Authorizing Provider  HYDROcodone-acetaminophen (NORCO/VICODIN) 5-325 MG per tablet 1 to 2 tabs every 4 to 6 hours as needed for pain. 12/08/13   Reuben Likes, MD  sulfamethoxazole-trimethoprim (BACTRIM DS) 800-160 MG per tablet Take 2 tablets by mouth 2 (two)  times daily. 12/08/13   Reuben Likes, MD   BP 109/63 mmHg  Pulse 69  Temp(Src) 98.5 F (36.9 C) (Oral)  Resp 20  Ht  (1.702 m)  Wt 230 lb (104.327 kg)  BMI 36.01 kg/m2  SpO2 98%  LMP 06/07/2015   Physical Exam  Constitutional: She is oriented to person, place, and time. She appears well-developed and well-nourished. No distress.  HENT:  Head: Normocephalic and atraumatic.  Mouth/Throat: Oropharynx is clear and moist.  Eyes: Conjunctivae and EOM are normal. Pupils are equal, round, and reactive to light.  Neck: Normal range of motion. Neck supple.  Cardiovascular: Normal rate, regular rhythm and normal heart sounds.   Pulmonary/Chest: Effort normal and breath sounds  normal. No respiratory distress. She has no wheezes.  Chest wall non-tender, no deformities noted Respirations unlabored, no distress  Abdominal: Soft. Bowel sounds are normal. There is no tenderness. There is no guarding.  Musculoskeletal: Normal range of motion.  No calf asymmetry, tenderness, or palpable cords; no overlying skin changes or warmth to touch; normal muscle tone bilaterally; bot legs are NVI  Neurological: She is alert and oriented to person, place, and time.  Skin: Skin is warm and dry. She is not diaphoretic.  Psychiatric: She has a normal mood and affect.  Nursing note and vitals reviewed.   ED Course  Procedures (including critical care time) Labs Review Labs Reviewed  BASIC METABOLIC PANEL - Abnormal; Notable for the following:    Sodium 134 (*)    BUN 5 (*)    All other components within normal limits  CBC - Abnormal; Notable for the following:    WBC 10.9 (*)    All other components within normal limits  Rosezena Sensor, ED    Imaging Review Dg Chest 2 View  07/11/2015   CLINICAL DATA:  24 year old female with right side chest pain, [redacted] weeks pregnant. Initial encounter.  EXAM: CHEST  2 VIEW  COMPARISON:  10/01/2008.  FINDINGS: Chronic large body habitus. Lung volumes remain normal. Normal cardiac size and mediastinal contours. Visualized tracheal air column is within normal limits. The lungs are clear. No pneumothorax or pleural effusion. No osseous abnormality identified.  IMPRESSION: Negative, no acute cardiopulmonary abnormality.   Electronically Signed   By: Odessa Fleming M.D.   On: 07/11/2015 16:49   I, Dail Lerew M, personally reviewed and evaluated these images and lab results as part of my medical decision-making.   EKG Interpretation   Date/Time:  Sunday July 11 2015 15:43:29 EDT Ventricular Rate:  68 PR Interval:  150 QRS Duration: 94 QT Interval:  391 QTC Calculation: 416 R Axis:   75 Text Interpretation:  Sinus rhythm Baseline wander in  lead(s) V1 V3  Confirmed by Fayrene Fearing  MD, MARK ([id number]) on 07/11/2015 5:15:41 PM      MDM   Final diagnoses:  Chest pain, unspecified chest pain type   24 year old female here with chest pain that began this morning. Pain described as a pressure without noted radiation. Pain noted with inspiration, however no shortness of breath. Vital signs are stable. EKG sinus rhythm without acute ischemic changes. Lab work including troponin are reassuring. Chest x-ray is clear. Patient is approximately [redacted] weeks pregnant, considered DVT/PE given her hypercoagulable state, however no exam findings concerning for DVT and her VS remain stable without tachycardia or hypoxia.  I have lower clinical suspicion for acute DVT or PE at this time and do not feel we need to go forward with CTA  chest at this time as this does post risk with radiation and potential fetal harm-- discussed this with patient and she is in agreement with this.  She will monitor her symptoms closely over the next 24-48 hours. She was encouraged to return here for any new or worsening symptoms including worsening chest pain, shortness of breath, palpitations, dizziness, weakness, high fever, etc.  Discussed plan with patient, he/she acknowledged understanding and agreed with plan of care.  Return precautions given for new or worsening symptoms.  Case discussed with attending physician, Dr. Fayrene Fearing, who agrees with assessment and plan of care.   Garlon Hatchet, PA-C 07/11/15 1942  Rolland Porter, MD 07/12/15 [phone]

## 2015-07-11 NOTE — Discharge Instructions (Signed)
Your work-up today was normal. Please monitor symptoms closely at home. Continue prenatal vitamins. Return to the ED for new or worsening symptoms.

## 2015-07-11 NOTE — ED Notes (Signed)
Per EMS: Pt seen at [facility] urgent care for right sided chest wall pressure. Pt is [redacted]wks pregnant. Denies any SOB. 7/10 pain. Vitals WDL.

## 2015-07-22 ENCOUNTER — Other Ambulatory Visit (HOSPITAL_COMMUNITY): Payer: Self-pay | Admitting: Family Medicine

## 2015-07-22 ENCOUNTER — Ambulatory Visit (HOSPITAL_COMMUNITY)
Admission: RE | Admit: 2015-07-22 | Discharge: 2015-07-22 | Disposition: A | Discharge status: Home / Self Care | Payer: Medicaid Other | Source: Ambulatory Visit | Attending: Family Medicine | Admitting: Family Medicine

## 2015-07-22 DIAGNOSIS — O4690 Antepartum hemorrhage, unspecified, unspecified trimester: Secondary | ICD-10-CM

## 2015-07-22 DIAGNOSIS — Z3A09 9 weeks gestation of pregnancy: Secondary | ICD-10-CM

## 2015-08-05 ENCOUNTER — Other Ambulatory Visit (HOSPITAL_COMMUNITY): Payer: Self-pay | Admitting: Obstetrics and Gynecology

## 2015-08-05 DIAGNOSIS — IMO0002 Reserved for concepts with insufficient information to code with codable children: Secondary | ICD-10-CM

## 2015-08-05 DIAGNOSIS — O3680X1 Pregnancy with inconclusive fetal viability, fetus 1: Secondary | ICD-10-CM

## 2015-08-09 ENCOUNTER — Other Ambulatory Visit: Payer: Self-pay | Admitting: Obstetrics and Gynecology

## 2015-08-24 ENCOUNTER — Encounter (HOSPITAL_COMMUNITY): Payer: Self-pay | Admitting: *Deleted

## 2015-08-29 MED ORDER — SODIUM CHLORIDE 0.9 % IJ SOLN
Freq: Once | INTRAMUSCULAR | Status: DC
  Filled 2015-08-29: qty 1

## 2015-08-30 ENCOUNTER — Ambulatory Visit (HOSPITAL_COMMUNITY)
Admission: RE | Admit: 2015-08-30 | Discharge: 2015-08-31 | Disposition: A | Discharge status: Home / Self Care | Payer: Medicaid Other | Source: Ambulatory Visit | Attending: Obstetrics and Gynecology | Admitting: Obstetrics and Gynecology

## 2015-08-30 ENCOUNTER — Ambulatory Visit (HOSPITAL_COMMUNITY): Payer: Medicaid Other | Admitting: Anesthesiology

## 2015-08-30 ENCOUNTER — Encounter (HOSPITAL_COMMUNITY): Payer: Self-pay

## 2015-08-30 DIAGNOSIS — Z3A16 16 weeks gestation of pregnancy: Secondary | ICD-10-CM | POA: Insufficient documentation

## 2015-08-30 DIAGNOSIS — O3432 Maternal care for cervical incompetence, second trimester: Secondary | ICD-10-CM | POA: Diagnosis not present

## 2015-08-30 LAB — CBC
HEMATOCRIT: 34.8 % — AB (ref 36.0–46.0)
HEMOGLOBIN: 12.4 g/dL (ref 12.0–15.0)
MCH: 30.5 pg (ref 26.0–34.0)
MCHC: 35.6 g/dL (ref 30.0–36.0)
MCV: 85.7 fL (ref 78.0–100.0)
Platelets: 253 10*3/uL (ref 150–400)
RBC: 4.06 MIL/uL (ref 3.87–5.11)
RDW: 12.9 % (ref 11.5–15.5)
WBC: 8.3 10*3/uL (ref 4.0–10.5)

## 2015-08-30 MED ORDER — LACTATED RINGERS IV SOLN
INTRAVENOUS | Status: DC
  Administered 2015-08-30: 13:00:00 via INTRAVENOUS

## 2015-08-30 MED ORDER — FENTANYL CITRATE (PF) 100 MCG/2ML IJ SOLN
INTRAMUSCULAR | Status: AC
  Filled 2015-08-30: qty 4

## 2015-08-30 NOTE — Anesthesia Preprocedure Evaluation (Deleted)
Anesthesia Evaluation  Patient identified by MRN, date of birth, ID band Patient awake    Reviewed: Allergy & Precautions, NPO status , Patient's Chart, lab work & pertinent test results  History of Anesthesia Complications Negative for: history of anesthetic complications  Airway Mallampati: II  TM Distance: >3 FB Neck ROM: Full    Dental  (+) Teeth Intact, Dental Advisory Given   Pulmonary former smoker,    Pulmonary exam normal breath sounds clear to auscultation       Cardiovascular Exercise Tolerance: Good (-) hypertensionnegative cardio ROS Normal cardiovascular exam Rhythm:Regular Rate:Normal     Neuro/Psych negative neurological ROS  negative psych ROS   GI/Hepatic negative GI ROS, Neg liver ROS,   Endo/Other  Obesity   Renal/GU negative Renal ROS     Musculoskeletal negative musculoskeletal ROS (+)   Abdominal   Peds  Hematology negative hematology ROS (+) Blood dyscrasia, anemia ,   Anesthesia Other Findings Day of surgery medications reviewed with the patient.  Reproductive/Obstetrics (+) Pregnancy                            Anesthesia Physical Anesthesia Plan  ASA: II  Anesthesia Plan: Spinal   Post-op Pain Management:    Induction:   Airway Management Planned:   Additional Equipment:   Intra-op Plan:   Post-operative Plan:   Informed Consent: I have reviewed the patients History and Physical, chart, labs and discussed the procedure including the risks, benefits and alternatives for the proposed anesthesia with the patient or authorized representative who has indicated his/her understanding and acceptance.   Dental advisory given  Plan Discussed with: CRNA, Anesthesiologist and Surgeon  Anesthesia Plan Comments: (Discussed risks and benefits of and differences between spinal and general. Discussed risks of spinal including headache, backache, failure,  bleeding, infection, and nerve damage. Patient consents to spinal. Questions answered. Coagulation studies and platelet count acceptable.  Patient ate toast with cream cheese at 0930 today.  Discussed with patient NPO guidelines and need to postpone case until patient has been NPO for 8 hours (1730 today).  Patient to discuss options with Dr. Normand Sloop.)       Anesthesia Quick Evaluation

## 2015-09-01 ENCOUNTER — Ambulatory Visit (HOSPITAL_COMMUNITY)
Admission: RE | Admit: 2015-09-01 | Discharge: 2015-09-01 | Disposition: A | Discharge status: Home / Self Care | Payer: Medicaid Other | Source: Ambulatory Visit | Attending: Obstetrics and Gynecology | Admitting: Obstetrics and Gynecology

## 2015-09-01 ENCOUNTER — Ambulatory Visit (HOSPITAL_COMMUNITY): Payer: Medicaid Other | Admitting: Anesthesiology

## 2015-09-01 ENCOUNTER — Encounter (HOSPITAL_COMMUNITY): Payer: Self-pay

## 2015-09-01 ENCOUNTER — Encounter (HOSPITAL_COMMUNITY)
Admission: RE | Disposition: A | Discharge status: Home / Self Care | Payer: Self-pay | Source: Ambulatory Visit | Attending: Obstetrics and Gynecology

## 2015-09-01 DIAGNOSIS — Z6836 Body mass index (BMI) 36.0-36.9, adult: Secondary | ICD-10-CM | POA: Diagnosis not present

## 2015-09-01 DIAGNOSIS — Z87891 Personal history of nicotine dependence: Secondary | ICD-10-CM | POA: Diagnosis not present

## 2015-09-01 DIAGNOSIS — O99212 Obesity complicating pregnancy, second trimester: Secondary | ICD-10-CM | POA: Diagnosis not present

## 2015-09-01 DIAGNOSIS — O99012 Anemia complicating pregnancy, second trimester: Secondary | ICD-10-CM | POA: Diagnosis not present

## 2015-09-01 DIAGNOSIS — Z3A16 16 weeks gestation of pregnancy: Secondary | ICD-10-CM | POA: Insufficient documentation

## 2015-09-01 DIAGNOSIS — O3432 Maternal care for cervical incompetence, second trimester: Secondary | ICD-10-CM | POA: Diagnosis present

## 2015-09-01 HISTORY — PX: CERVICAL CERCLAGE: SHX1329

## 2015-09-01 SURGERY — CERCLAGE, CERVIX, VAGINAL APPROACH
Anesthesia: Choice

## 2015-09-01 SURGERY — CERCLAGE, CERVIX, VAGINAL APPROACH
Anesthesia: Spinal | Site: Vagina

## 2015-09-01 SURGERY — CERCLAGE, CERVIX, VAGINAL APPROACH
Anesthesia: Spinal

## 2015-09-01 MED ORDER — SCOPOLAMINE 1 MG/3DAYS TD PT72
1.0000 | MEDICATED_PATCH | Freq: Once | TRANSDERMAL | Status: DC
  Administered 2015-09-01: 1.5 mg via TRANSDERMAL

## 2015-09-01 MED ORDER — SODIUM CHLORIDE 0.9 % IJ SOLN
Freq: Once | INTRAMUSCULAR | Status: AC
  Administered 2015-09-01: 30 mL via VAGINAL
  Filled 2015-09-01: qty 1

## 2015-09-01 MED ORDER — FENTANYL CITRATE (PF) 100 MCG/2ML IJ SOLN: 25.0000 ug | INTRAMUSCULAR | Status: DC | PRN

## 2015-09-01 MED ORDER — BUPIVACAINE IN DEXTROSE 0.75-8.25 % IT SOLN
INTRATHECAL | Status: DC | PRN
  Administered 2015-09-01: 1 mL via INTRATHECAL

## 2015-09-01 MED ORDER — LACTATED RINGERS IV SOLN
INTRAVENOUS | Status: DC
  Administered 2015-09-01: 13:00:00 via INTRAVENOUS
  Administered 2015-09-01: 125 mL/h via INTRAVENOUS

## 2015-09-01 MED ORDER — INDOMETHACIN 25 MG PO CAPS: 25.0000 mg | ORAL_CAPSULE | Freq: Four times a day (QID) | ORAL | Status: DC

## 2015-09-01 MED ORDER — SCOPOLAMINE 1 MG/3DAYS TD PT72
MEDICATED_PATCH | TRANSDERMAL | Status: AC
  Filled 2015-09-01: qty 1

## 2015-09-01 MED ORDER — INDOMETHACIN 25 MG PO CAPS
25.0000 mg | ORAL_CAPSULE | Freq: Once | ORAL | Status: AC
  Administered 2015-09-01: 25 mg via ORAL
  Filled 2015-09-01: qty 1

## 2015-09-01 MED ORDER — DROPERIDOL 2.5 MG/ML IJ SOLN
0.6250 mg | INTRAMUSCULAR | Status: DC | PRN
  Filled 2015-09-01: qty 0.25

## 2015-09-01 MED ORDER — PROMETHAZINE HCL 25 MG/ML IJ SOLN: 6.2500 mg | INTRAMUSCULAR | Status: DC | PRN

## 2015-09-01 MED ORDER — MEPERIDINE HCL 25 MG/ML IJ SOLN: 6.2500 mg | INTRAMUSCULAR | Status: DC | PRN

## 2015-09-01 SURGICAL SUPPLY — 17 items
CLOTH BEACON ORANGE TIMEOUT ST (SAFETY) ×2 IMPLANT
COUNTER NEEDLE 1200 MAGNETIC (NEEDLE) ×1 IMPLANT
GLOVE BIO SURGEON STRL SZ 6.5 (GLOVE) ×2 IMPLANT
GLOVE BIOGEL PI IND STRL 7.0 (GLOVE) ×1 IMPLANT
GLOVE BIOGEL PI INDICATOR 7.0 (GLOVE) ×1
GOWN STRL REUS W/TWL LRG LVL3 (GOWN DISPOSABLE) ×4 IMPLANT
PACK VAGINAL MINOR WOMEN LF (CUSTOM PROCEDURE TRAY) ×2 IMPLANT
PAD OB MATERNITY 4.3X12.25 (PERSONAL CARE ITEMS) ×2 IMPLANT
PAD PREP 24X48 CUFFED NSTRL (MISCELLANEOUS) ×2 IMPLANT
SCOPETTES 8  STERILE (MISCELLANEOUS) ×2
SCOPETTES 8 STERILE (MISCELLANEOUS) ×2 IMPLANT
SUT MERSILENE 5MM BP 1 12 (SUTURE) ×4 IMPLANT
TOWEL OR 17X24 6PK STRL BLUE (TOWEL DISPOSABLE) ×4 IMPLANT
TRAY FOLEY CATH SILVER 14FR (SET/KITS/TRAYS/PACK) ×2 IMPLANT
TUBING NON-CON 1/4 X 20 CONN (TUBING) ×2 IMPLANT
WATER STERILE IRR 1000ML POUR (IV SOLUTION) ×2 IMPLANT
YANKAUER SUCT BULB TIP NO VENT (SUCTIONS) ×2 IMPLANT

## 2015-09-01 NOTE — Anesthesia Postprocedure Evaluation (Signed)
Anesthesia Post Note  Patient: Sarah Sutton  Procedure(s) Performed: Procedure(s) (LRB): CERCLAGE CERVICAL (N/A)  Anesthesia type: Spinal  Patient location: PACU  Post pain: Pain level controlled  Post assessment: Post-op Vital signs reviewed  Last Vitals:  Filed Vitals:   09/01/15 1315  BP: 126/60  Pulse: 76  Temp:   Resp: 20    Post vital signs: Reviewed  Level of consciousness: awake  Complications: No apparent anesthesia complications

## 2015-09-01 NOTE — Anesthesia Procedure Notes (Signed)
Spinal Patient location during procedure: OR Start time: 09/01/2015 12:02 PM End time: 09/01/2015 12:05 PM Staffing Anesthesiologist: Leilani Able Performed by: anesthesiologist  Preanesthetic Checklist Completed: patient identified, surgical consent, pre-op evaluation, timeout performed, IV checked, risks and benefits discussed and monitors and equipment checked Spinal Block Patient position: sitting Prep: site prepped and draped and DuraPrep Patient monitoring: heart rate, cardiac monitor, continuous pulse ox and blood pressure Approach: midline Location: L3-4 Injection technique: single-shot Needle Needle type: Pencan  Needle gauge: 24 G Needle length: 9 cm Needle insertion depth: 6 cm Assessment Sensory level: T10

## 2015-09-01 NOTE — Anesthesia Preprocedure Evaluation (Addendum)
Anesthesia Evaluation  Patient identified by MRN, date of birth, ID band Patient awake    Reviewed: Allergy & Precautions, NPO status , Patient's Chart, lab work & pertinent test results  History of Anesthesia Complications Negative for: history of anesthetic complications  Airway Mallampati: II  TM Distance: >3 FB Neck ROM: Full    Dental no notable dental hx. (+) Dental Advisory Given   Pulmonary former smoker,    Pulmonary exam normal        Cardiovascular negative cardio ROS Normal cardiovascular exam     Neuro/Psych negative neurological ROS  negative psych ROS   GI/Hepatic negative GI ROS, Neg liver ROS,   Endo/Other  obesity  Renal/GU   negative genitourinary   Musculoskeletal negative musculoskeletal ROS (+)   Abdominal (+) + obese,   Peds negative pediatric ROS (+)  Hematology  (+) anemia ,   Anesthesia Other Findings   Reproductive/Obstetrics (+) Pregnancy                            Anesthesia Physical Anesthesia Plan  ASA: II  Anesthesia Plan: Spinal   Post-op Pain Management:    Induction:   Airway Management Planned:   Additional Equipment:   Intra-op Plan:   Post-operative Plan:   Informed Consent: I have reviewed the patients History and Physical, chart, labs and discussed the procedure including the risks, benefits and alternatives for the proposed anesthesia with the patient or authorized representative who has indicated his/her understanding and acceptance.     Plan Discussed with: CRNA and Surgeon  Anesthesia Plan Comments:        Anesthesia Quick Evaluation                                  Anesthesia Evaluation  Patient identified by MRN, date of birth, ID band Patient awake    Reviewed: Allergy & Precautions, H&P , NPO status , Patient's Chart, lab work & pertinent test results  Airway Mallampati: II TM Distance: >3 FB Neck ROM:  full    Dental No notable dental hx.    Pulmonary neg pulmonary ROS,    Pulmonary exam normal       Cardiovascular negative cardio ROS      Neuro/Psych negative neurological ROS  negative psych ROS   GI/Hepatic negative GI ROS, Neg liver ROS,   Endo/Other  Morbid obesity  Renal/GU negative Renal ROS  negative genitourinary   Musculoskeletal negative musculoskeletal ROS (+)   Abdominal (+) + obese,   Peds negative pediatric ROS (+)  Hematology negative hematology ROS (+)   Anesthesia Other Findings   Reproductive/Obstetrics (+) Pregnancy                           Anesthesia Physical Anesthesia Plan  ASA: III  Anesthesia Plan: Spinal   Post-op Pain Management:    Induction:   Airway Management Planned:   Additional Equipment:   Intra-op Plan:   Post-operative Plan:   Informed Consent: I have reviewed the patients History and Physical, chart, labs and discussed the procedure including the risks, benefits and alternatives for the proposed anesthesia with the patient or authorized representative who has indicated his/her understanding and acceptance.     Plan Discussed with: CRNA and Surgeon  Anesthesia Plan Comments:         Anesthesia Quick Evaluation

## 2015-09-01 NOTE — Transfer of Care (Signed)
Immediate Anesthesia Transfer of Care Note  Patient: Sarah Sutton  Procedure(s) Performed: Procedure(s): CERCLAGE CERVICAL (N/A)  Patient Location: PACU  Anesthesia Type:Spinal  Level of Consciousness: awake  Airway & Oxygen Therapy: Patient Spontanous Breathing  Post-op Assessment: Report given to RN and Post -op Vital signs reviewed and stable  Post vital signs: stable  Last Vitals:  Filed Vitals:   09/01/15 1030  BP: 130/55  Pulse: 78  Temp: 36.8 C  Resp: 18    Complications: No apparent anesthesia complications

## 2015-09-01 NOTE — Op Note (Signed)
Preoperative diagnosis: Incompetent cervix at 16 weeks Postop diagnosis: Same Procedure: McDonald cervical cerclage Surgeon: Dr. Linton Flemings a [name] Assistant: Alphonzo Severance CNM Complications : None Blood loss: Normal Procedure detail: She was taken to the operating room she was given spinal anesthesia placed in dorsal lithotomy position and prepped and draped in a normal sterile fashion.  Foley catheter was placed under sterile conditions.  A speculum was placed into the posterior aspects of the vagina.  Her was used to retract and see the cervix.  The cervix was about 50% effaced and closed. Is  Was very soft.  Sarah Sutton  cerclage was placed without difficulty using Mersilene.  Clindamycin douche was then placed into the vagina.  On one stitch on the patient's right side at 9:00 the stitch did pull  through the mucosa around the cervix.  This was reapproximated using 2 Ovide role with a figure-of-eight stitch. Hemostasis was assured. All issues removed from the vagina sponge lap needle counts were correct patient  went to recovery room in stable condition

## 2015-09-01 NOTE — H&P (Signed)
Sarah Sutton is an 24 y.o. female. With H/O incompetent cervix and emergency cerclage.  Presents for prophylactic cerclage today  Pertinent Gynecological History: Menses: flow is moderate Bleeding: NA Contraception: none DES exposure: denies Blood transfusions: none Sexually transmitted diseases: see above Previous GYN Procedures: see above  Last mammogram: NA Date:  Last pap: normal Date: 2016 OB History: G3, P1   Menstrual History: Menarche age: 87  Patient's last menstrual period was 06/07/2015.    Past Medical History  Diagnosis Date  . Polycystic ovarian syndrome 2009    by Dr. Gaynell Face  . Anemia     NO MEDS IN PAST  . Infection     UTI;NOT FREQ  . Infection     YEAST INF;NOT FREQ  . Infection     BV X 1;CURRENTLY TAKING FLAGYL FOR EX  . Abnormal Pap smear 2007    COLPO DONE;LAST PAP 03/2011 WAS NORMAL  . NSVD (normal spontaneous vaginal delivery) 09/30/2012    Preterm delivery at 34 weeks; IOL for PPROM  . Heart murmur     AS A CHILD, never caused any problems    Past Surgical History  Procedure Laterality Date  . Cholecystectomy  november 2009  . Colposcopy  2009  . Wisdom tooth extraction      ALL 4 REMOVED  . Cervical cerclage  07/01/2012    Procedure: CERCLAGE CERVICAL;  Surgeon: Esmeralda Arthur, MD;  Location: [facility];  Service: Gynecology;  Laterality: N/A;    Family History  Problem Relation Age of Onset  . Asthma Mother   . Diabetes Father   . Hypertension Father   . Cancer Maternal Grandmother     bone cancer  . Cancer Paternal Grandmother     breast cancer    Social History:  reports that she quit smoking about 2 months ago. Her smoking use included Cigarettes. She has a 5 pack-year smoking history. She has never used smokeless tobacco. She reports that she uses illicit drugs (Marijuana). She reports that she does not drink alcohol.  Allergies: No Known Allergies  Prescriptions prior to admission  Medication Sig Dispense Refill Last Dose  .  Prenatal Vit-Fe Fumarate-FA (PRENATAL MULTIVITAMIN) TABS tablet Take 2 tablets by mouth daily at 12 noon.   09/01/2015 at 0730  . acetaminophen (TYLENOL) 500 MG tablet Take 500 mg by mouth every 6 (six) hours as needed for headache.   More than a month at Unknown time  . calcium carbonate (TUMS - DOSED IN MG ELEMENTAL CALCIUM) 500 MG chewable tablet Chew 2 tablets by mouth daily as needed for indigestion or heartburn.   More than a month at Unknown time    ROS  Blood pressure 130/55, pulse 78, temperature 98.2 F (36.8 C), temperature source Oral, resp. rate 18, last menstrual period 06/07/2015, SpO2 100 %. Physical Exam  Physical Examination: General appearance - alert, well appearing, and in no distress Heart - normal rate and regular rhythm Abdomen - soft, nontender, nondistended, no masses or organomegaly Pelvic - normal external genitalia, vulva, vagina, cervix, uterus and adnexa Extremities - peripheral pulses normal, no pedal edema, no clubbing or cyanosis   No results found for this or any previous visit (from the past 24 hour(s)).  No results found.  Assessment/Plan: Incompetent cervix Pt for ceclage.  Risks of bleeding, infection damage to vagina and cervix with ROM reviewed with the pt  Woodford Strege A 09/01/2015, 11:38 AM

## 2015-09-01 NOTE — OR Nursing (Signed)
1300- fetal heart rate 159.

## 2015-09-01 NOTE — Discharge Instructions (Signed)

## 2015-09-02 ENCOUNTER — Encounter (HOSPITAL_COMMUNITY): Payer: Self-pay | Admitting: Obstetrics and Gynecology

## 2015-10-19 NOTE — H&P (Signed)
   Pertinent Gynecological History: Menses: flow is moderate Bleeding: NA Contraception: none DES exposure: denies Blood transfusions: none Sexually transmitted diseases: see above Previous GYN Procedures: see above  Last mammogram: NA Date:  Last pap: normal Date: 2016 OB History: G3, P1  Menstrual History: Menarche age: [age]  Patient's last menstrual period was 06/07/2015.    Past Medical History  Diagnosis Date  . Polycystic ovarian syndrome 2009    by Dr. Gaynell FaceMarshall  . Anemia     NO MEDS IN PAST  . Infection     UTI;NOT FREQ  . Infection     YEAST INF;NOT FREQ  . Infection     BV X 1;CURRENTLY TAKING FLAGYL FOR EX  . Abnormal Pap smear 2007    COLPO DONE;LAST PAP 03/2011 WAS NORMAL  . NSVD (normal spontaneous vaginal delivery) 09/30/2012    Preterm delivery at 34 weeks; IOL for PPROM  . Heart murmur     AS A CHILD, never caused any problems    Past Surgical History  Procedure Laterality Date  . Cholecystectomy  november 2009  . Colposcopy  2009  . Wisdom tooth extraction      ALL 4 REMOVED  . Cervical cerclage  07/01/2012    Procedure: CERCLAGE CERVICAL; Surgeon: Esmeralda ArthurSandra A Rivard, MD; Location: [facility]; Service: Gynecology; Laterality: N/A;    Family History  Problem Relation Age of Onset  . Asthma Mother   . Diabetes Father   . Hypertension Father   . Cancer Maternal Grandmother     bone cancer  . Cancer Paternal Grandmother     breast cancer    Social History:  reports that she quit smoking about 2 months ago. Her smoking use included Cigarettes. She has a 5 pack-year smoking history. She has never used smokeless tobacco. She reports that she uses illicit drugs (Marijuana). She reports that she does not drink alcohol.  Allergies: No Known Allergies  Prescriptions prior to admission  Medication Sig Dispense Refill Last Dose  .  Prenatal Vit-Fe Fumarate-FA (PRENATAL MULTIVITAMIN) TABS tablet Take 2 tablets by mouth daily at 12 noon.   09/01/2015 at 0730  . acetaminophen (TYLENOL) 500 MG tablet Take 500 mg by mouth every 6 (six) hours as needed for headache.   More than a month at Unknown time  . calcium carbonate (TUMS - DOSED IN MG ELEMENTAL CALCIUM) 500 MG chewable tablet Chew 2 tablets by mouth daily as needed for indigestion or heartburn.   More than a month at Unknown time    ROS  Blood pressure 130/55, pulse 78, temperature 98.2 F (36.8 C), temperature source Oral, resp. rate 18, last menstrual period 06/07/2015, SpO2 100 %. Physical Exam  Physical Examination: General appearance - alert, well appearing, and in no distress Heart - normal rate and regular rhythm Abdomen - soft, nontender, nondistended, no masses or organomegaly Pelvic - normal external genitalia, vulva, vagina, cervix, uterus and adnexa Extremities - peripheral pulses normal, no pedal edema, no clubbing or cyanosis    Lab Results Last 24 Hours    No results found for this or any previous visit (from the past 24 hour(s)).     Imaging Results (Last 48 hours)    No results found.    Assessment/Plan: Incompetent cervix Pt for ceclage. Risks of bleeding, infection damage to vagina and cervix with ROM reviewed with the pt

## 2015-11-18 ENCOUNTER — Encounter (HOSPITAL_COMMUNITY): Payer: Self-pay | Admitting: *Deleted

## 2015-11-18 ENCOUNTER — Inpatient Hospital Stay (HOSPITAL_COMMUNITY)
Admission: AD | Admit: 2015-11-18 | Discharge: 2015-11-18 | Disposition: A | Discharge status: Home / Self Care | Payer: Medicaid Other | Source: Ambulatory Visit | Attending: Obstetrics and Gynecology | Admitting: Obstetrics and Gynecology

## 2015-11-18 DIAGNOSIS — Z87891 Personal history of nicotine dependence: Secondary | ICD-10-CM | POA: Insufficient documentation

## 2015-11-18 DIAGNOSIS — Z3A27 27 weeks gestation of pregnancy: Secondary | ICD-10-CM | POA: Insufficient documentation

## 2015-11-18 DIAGNOSIS — O3432 Maternal care for cervical incompetence, second trimester: Secondary | ICD-10-CM | POA: Insufficient documentation

## 2015-11-18 DIAGNOSIS — O321XX0 Maternal care for breech presentation, not applicable or unspecified: Secondary | ICD-10-CM | POA: Insufficient documentation

## 2015-11-18 LAB — URINE MICROSCOPIC-ADD ON

## 2015-11-18 LAB — URINALYSIS, ROUTINE W REFLEX MICROSCOPIC
Bilirubin Urine: NEGATIVE
Glucose, UA: NEGATIVE mg/dL
Ketones, ur: NEGATIVE mg/dL
Leukocytes, UA: NEGATIVE
Nitrite: NEGATIVE
Protein, ur: NEGATIVE mg/dL
Specific Gravity, Urine: 1.02 (ref 1.005–1.030)
pH: 6.5 (ref 5.0–8.0)

## 2015-11-18 MED ORDER — BETAMETHASONE SOD PHOS & ACET 6 (3-3) MG/ML IJ SUSP
12.0000 mg | Freq: Once | INTRAMUSCULAR | Status: AC
  Administered 2015-11-18: 12 mg via INTRAMUSCULAR
  Filled 2015-11-18: qty 2

## 2015-11-18 MED ORDER — BETAMETHASONE SOD PHOS & ACET 6 (3-3) MG/ML IJ SUSP: 12.0000 mg | Freq: Once | INTRAMUSCULAR | Status: DC

## 2015-11-18 NOTE — MAU Provider Note (Signed)
DATE: 11/18/2015  Maternity Admissions Unit History and Physical Exam for an Obstetrics Patient   [name] is a 24 y.o. female, G2P0101, at [redacted]w[redacted]d gestation, who presents for evaluation of an incompetent cervix. She has been followed at the [facility] and Gynecology division of [facility].  Her pregnancy has been complicated by an incompetent cervix. A cerclage has been placed. The patient has been followed closely for the length of her cervix. One week ago her cervix was noted to be 0.6 and meters thick. An ultrasound was performed today that shows that her cervix 0.2 cm thick. The baby is in a breech presentation. The amniotic fluid volume is normal. Fetal heart tones are normal. She does not have a placenta previa. The patient currently receives 17 P on a weekly basis. She is on bed rest at home. She denies bleeding and leakage of fluid. She has come to the hospital for evaluation for contractions and to receive betamethasone. See history below.  OB History    Gravida Para Term Preterm AB TAB SAB Ectopic Multiple Living        Past Medical History  Diagnosis Date  . Polycystic ovarian syndrome 2009    by Dr. Gaynell Face  . Anemia     NO MEDS IN PAST  . Infection     UTI;NOT FREQ  . Infection     YEAST INF;NOT FREQ  . Infection     BV X 1;CURRENTLY TAKING FLAGYL FOR EX  . Abnormal Pap smear 2007    COLPO DONE;LAST PAP 03/2011 WAS NORMAL  . NSVD (normal spontaneous vaginal delivery) 09/30/2012    Preterm delivery at 34 weeks; IOL for PPROM  . Heart murmur     AS A CHILD, never caused any problems  . Vaginal Pap smear, abnormal   . Preterm labor     Prescriptions prior to admission  Medication Sig Dispense Refill Last Dose  . Prenatal Vit-Fe Fumarate-FA (PRENATAL MULTIVITAMIN) TABS tablet Take 2 tablets by mouth daily at 12 noon.   11/18/2015 at Unknown time  . indomethacin (INDOCIN) 25 MG capsule Take 1 capsule (25 mg  total) by mouth every 6 (six) hours. (Patient not taking: Reported on 11/18/2015) 3 capsule 0     Past Surgical History  Procedure Laterality Date  . Cholecystectomy  november 2009  . Colposcopy  2009  . Wisdom tooth extraction      ALL 4 REMOVED  . Cervical cerclage  07/01/2012    Procedure: CERCLAGE CERVICAL;  Surgeon: Esmeralda Lanisa Ishler, MD;  Location: [facility];  Service: Gynecology;  Laterality: N/A;  . Cervical cerclage N/A 09/01/2015    Procedure: CERCLAGE CERVICAL;  Surgeon: Jaymes Graff, MD;  Location: [facility];  Service: Gynecology;  Laterality: N/A;    No Known Allergies  Family History: family history includes Asthma in her mother; Cancer in her maternal grandmother and paternal grandmother; Diabetes in her father; Hypertension in her father.  Social History:  reports that she quit smoking about 4 months ago. Her smoking use included Cigarettes. She has a 5 pack-year smoking history. She has never used smokeless tobacco. She reports that she uses illicit drugs (Marijuana). She reports that she does not drink alcohol.  Review of systems: Normal pregnancy complaints.  Admission Physical Exam:    There is no weight on file to calculate BMI.  Blood pressure 149/71, pulse 87, temperature 97.7 F (36.5 C), resp. rate  18, last menstrual period 06/07/2015.  HEENT:                 Within normal limits Chest:                   Clear Heart:                    Regular rate and rhythm Abdomen:             Gravid and nontender Extremities:          Grossly normal Neurologic exam: Grossly normal Pelvic exam:         Cervix: Exam deferred  NST: Category category 1 for a [redacted] week gestation; Contractions: None .   Assessment:  2915w5d gestation  Incompetent cervix  Thinning cervix  Obesity  Breech presentation  Plan:  We discussed management options. Risk and benefits were reviewed. Questions were answered. We will give the patient betamethasone today and repeat her dose in 24  hours. She will maintain rest at home. She will continue to receive intramuscular progesterone. The data does not support in-hospital bed rest. She will return to our office for follow-up in one week. She will call for bleeding or leakage of fluid.   Izaah Westman V 11/18/2015, 10:27 AM

## 2015-11-18 NOTE — Discharge Instructions (Signed)

## 2015-11-18 NOTE — MAU Note (Signed)
Pt presents to MAU from physician's office for monitoring. PT has a cerclage and had preterm labor with her first baby and was hospitalized for 6 weeks before delivering at 34wks. Ultrasound at office today showed her cervix opening.

## 2015-11-19 ENCOUNTER — Inpatient Hospital Stay (HOSPITAL_COMMUNITY)
Admission: AD | Admit: 2015-11-19 | Discharge: 2015-11-19 | Disposition: A | Discharge status: Home / Self Care | Payer: Medicaid Other | Source: Ambulatory Visit | Attending: Obstetrics and Gynecology | Admitting: Obstetrics and Gynecology

## 2015-11-19 DIAGNOSIS — E669 Obesity, unspecified: Secondary | ICD-10-CM | POA: Insufficient documentation

## 2015-11-19 DIAGNOSIS — Z3A26 26 weeks gestation of pregnancy: Secondary | ICD-10-CM | POA: Diagnosis not present

## 2015-11-19 DIAGNOSIS — O99212 Obesity complicating pregnancy, second trimester: Secondary | ICD-10-CM | POA: Insufficient documentation

## 2015-11-19 DIAGNOSIS — O3432 Maternal care for cervical incompetence, second trimester: Secondary | ICD-10-CM | POA: Insufficient documentation

## 2015-11-19 DIAGNOSIS — O321XX0 Maternal care for breech presentation, not applicable or unspecified: Secondary | ICD-10-CM | POA: Diagnosis not present

## 2015-11-19 MED ORDER — BETAMETHASONE SOD PHOS & ACET 6 (3-3) MG/ML IJ SUSP
12.0000 mg | Freq: Once | INTRAMUSCULAR | Status: AC
  Administered 2015-11-19: 12 mg via INTRAMUSCULAR
  Filled 2015-11-19: qty 2

## 2015-11-19 NOTE — MAU Note (Signed)
Pt here for 2nd betamethasone injection.

## 2015-12-08 ENCOUNTER — Encounter (HOSPITAL_COMMUNITY): Payer: Self-pay | Admitting: *Deleted

## 2015-12-08 ENCOUNTER — Inpatient Hospital Stay (HOSPITAL_COMMUNITY)
Admission: AD | Admit: 2015-12-08 | Discharge: 2015-12-18 | DRG: 765 | Disposition: A | Discharge status: Home / Self Care | Payer: Medicaid Other | Source: Ambulatory Visit | Attending: Obstetrics and Gynecology | Admitting: Obstetrics and Gynecology

## 2015-12-08 DIAGNOSIS — O09213 Supervision of pregnancy with history of pre-term labor, third trimester: Secondary | ICD-10-CM

## 2015-12-08 DIAGNOSIS — Z9889 Other specified postprocedural states: Secondary | ICD-10-CM

## 2015-12-08 DIAGNOSIS — Z6836 Body mass index (BMI) 36.0-36.9, adult: Secondary | ICD-10-CM | POA: Diagnosis not present

## 2015-12-08 DIAGNOSIS — F1290 Cannabis use, unspecified, uncomplicated: Secondary | ICD-10-CM | POA: Diagnosis present

## 2015-12-08 DIAGNOSIS — O864 Pyrexia of unknown origin following delivery: Secondary | ICD-10-CM | POA: Diagnosis not present

## 2015-12-08 DIAGNOSIS — O3440 Maternal care for other abnormalities of cervix, unspecified trimester: Secondary | ICD-10-CM

## 2015-12-08 DIAGNOSIS — O09893 Supervision of other high risk pregnancies, third trimester: Secondary | ICD-10-CM

## 2015-12-08 DIAGNOSIS — R51 Headache: Secondary | ICD-10-CM | POA: Diagnosis present

## 2015-12-08 DIAGNOSIS — O3430 Maternal care for cervical incompetence, unspecified trimester: Secondary | ICD-10-CM | POA: Diagnosis present

## 2015-12-08 DIAGNOSIS — Z3403 Encounter for supervision of normal first pregnancy, third trimester: Secondary | ICD-10-CM | POA: Diagnosis present

## 2015-12-08 DIAGNOSIS — O3433 Maternal care for cervical incompetence, third trimester: Secondary | ICD-10-CM | POA: Diagnosis present

## 2015-12-08 DIAGNOSIS — O4290 Premature rupture of membranes, unspecified as to length of time between rupture and onset of labor, unspecified weeks of gestation: Secondary | ICD-10-CM

## 2015-12-08 DIAGNOSIS — O42913 Preterm premature rupture of membranes, unspecified as to length of time between rupture and onset of labor, third trimester: Principal | ICD-10-CM | POA: Diagnosis present

## 2015-12-08 DIAGNOSIS — Z3689 Encounter for other specified antenatal screening: Secondary | ICD-10-CM

## 2015-12-08 DIAGNOSIS — Z3A29 29 weeks gestation of pregnancy: Secondary | ICD-10-CM | POA: Diagnosis not present

## 2015-12-08 DIAGNOSIS — O99324 Drug use complicating childbirth: Secondary | ICD-10-CM | POA: Diagnosis present

## 2015-12-08 DIAGNOSIS — O42919 Preterm premature rupture of membranes, unspecified as to length of time between rupture and onset of labor, unspecified trimester: Secondary | ICD-10-CM

## 2015-12-08 DIAGNOSIS — Z98891 History of uterine scar from previous surgery: Secondary | ICD-10-CM

## 2015-12-08 DIAGNOSIS — O368390 Maternal care for abnormalities of the fetal heart rate or rhythm, unspecified trimester, not applicable or unspecified: Secondary | ICD-10-CM

## 2015-12-08 DIAGNOSIS — Z87891 Personal history of nicotine dependence: Secondary | ICD-10-CM

## 2015-12-08 DIAGNOSIS — O3443 Maternal care for other abnormalities of cervix, third trimester: Secondary | ICD-10-CM

## 2015-12-08 DIAGNOSIS — O09219 Supervision of pregnancy with history of pre-term labor, unspecified trimester: Secondary | ICD-10-CM

## 2015-12-08 DIAGNOSIS — Z3A30 30 weeks gestation of pregnancy: Secondary | ICD-10-CM

## 2015-12-08 DIAGNOSIS — O99214 Obesity complicating childbirth: Secondary | ICD-10-CM | POA: Diagnosis present

## 2015-12-08 DIAGNOSIS — E669 Obesity, unspecified: Secondary | ICD-10-CM | POA: Diagnosis present

## 2015-12-08 LAB — TYPE AND SCREEN
ABO/RH(D): A POS
Antibody Screen: NEGATIVE

## 2015-12-08 LAB — URINALYSIS, ROUTINE W REFLEX MICROSCOPIC
Bilirubin Urine: NEGATIVE
Glucose, UA: NEGATIVE mg/dL
KETONES UR: NEGATIVE mg/dL
NITRITE: NEGATIVE
PROTEIN: 30 mg/dL — AB
Specific Gravity, Urine: 1.02 (ref 1.005–1.030)
pH: 7 (ref 5.0–8.0)

## 2015-12-08 LAB — URINE MICROSCOPIC-ADD ON

## 2015-12-08 LAB — GROUP B STREP BY PCR: Group B strep by PCR: NEGATIVE

## 2015-12-08 MED ORDER — HYDROXYPROGESTERONE CAPROATE 250 MG/ML IM OIL
250.0000 mg | TOPICAL_OIL | INTRAMUSCULAR | Status: DC
  Administered 2015-12-10: 250 mg via INTRAMUSCULAR
  Filled 2015-12-08: qty 1

## 2015-12-08 MED ORDER — ACETAMINOPHEN 325 MG PO TABS
650.0000 mg | ORAL_TABLET | ORAL | Status: DC | PRN
  Administered 2015-12-11: 650 mg via ORAL
  Filled 2015-12-08: qty 2

## 2015-12-08 MED ORDER — LACTATED RINGERS IV SOLN
INTRAVENOUS | Status: DC
  Administered 2015-12-08 – 2015-12-15 (×9): via INTRAVENOUS

## 2015-12-08 MED ORDER — DOCUSATE SODIUM 100 MG PO CAPS
100.0000 mg | ORAL_CAPSULE | Freq: Every day | ORAL | Status: DC
  Administered 2015-12-08 – 2015-12-11 (×2): 100 mg via ORAL
  Filled 2015-12-08 (×8): qty 1

## 2015-12-08 MED ORDER — ZOLPIDEM TARTRATE 5 MG PO TABS
5.0000 mg | ORAL_TABLET | Freq: Every evening | ORAL | Status: DC | PRN
  Administered 2015-12-08 – 2015-12-14 (×7): 5 mg via ORAL
  Filled 2015-12-08 (×7): qty 1

## 2015-12-08 MED ORDER — PRENATAL MULTIVITAMIN CH
1.0000 | ORAL_TABLET | Freq: Every day | ORAL | Status: DC
  Administered 2015-12-08 – 2015-12-15 (×8): 1 via ORAL
  Filled 2015-12-08 (×9): qty 1

## 2015-12-08 MED ORDER — CALCIUM CARBONATE ANTACID 500 MG PO CHEW
2.0000 | CHEWABLE_TABLET | ORAL | Status: DC | PRN
  Filled 2015-12-08: qty 2

## 2015-12-08 MED ORDER — AMOXICILLIN 500 MG PO CAPS
500.0000 mg | ORAL_CAPSULE | Freq: Three times a day (TID) | ORAL | Status: AC
  Administered 2015-12-10 – 2015-12-15 (×15): 500 mg via ORAL
  Filled 2015-12-08 (×15): qty 1

## 2015-12-08 MED ORDER — SODIUM CHLORIDE 0.9 % IV SOLN
2.0000 g | Freq: Four times a day (QID) | INTRAVENOUS | Status: AC
  Administered 2015-12-08 – 2015-12-10 (×8): 2 g via INTRAVENOUS
  Filled 2015-12-08 (×8): qty 2000

## 2015-12-08 NOTE — MAU Note (Signed)
Pt states she has had a cerclage since 15 weeks.  On weekly visits now.  Good fetal movement.  Denies any vaginal bleeding.

## 2015-12-08 NOTE — MAU Note (Signed)
Pt states her water broke about 0200-0300.  Felt a pop and then gush of fluid.  Clear with green specs. Continues to leak fluid.

## 2015-12-08 NOTE — Progress Notes (Signed)
Hospital day # 0 pregnancy at 8074w4d--PPROM.  S:  No c/o      Perception of contractions: none      Vaginal bleeding: none       Vaginal discharge:  clear  O: BP 133/78 mmHg  Pulse 80  Temp(Src) 98.3 F (36.8 C) (Oral)  Resp 18  Ht 5\' 7"  (1.702 m)  Wt 106.142 kg (234 lb)  BMI 36.64 kg/m2  SpO2 100%  LMP 06/07/2015        Fetal tracings: BL FHR 135-140 bpm w/ moderate variability, +accels (10 x 10 w/ occasional 5x5), intermittent variables (21:52, 21:53, 21:55 and 21:56) w/ gradual return to baseline       Contractions: Uterine Irritability      Uterus gravid, NT/ND Speculum/Pelvic: Cerclage in situ -- intact w/o signs of PTL or intrauterine infection. No bleeding in vaginal vault, nor at cervical os. Cvx visually closed.      Extremities: no edema, redness or tenderness in the calves or thighs     Office scan on 12/03/2015 - Vertex, Anterior placenta, AFI 40th%, Cerclage seen, Dynamic cx 0.8 cm.       Labs: GBS neg via PCR on 12/08/15       Meds:  Current facility-administered medications:  .  acetaminophen (TYLENOL) tablet 650 mg, 650 mg, Oral, Q4H PRN, Venus Standard, CNM .  ampicillin (OMNIPEN) 2 g in sodium chloride 0.9 % 50 mL IVPB, 2 g, Intravenous, Q6H, 2 g at 12/09/15 0617 **FOLLOWED BY** [START ON 12/10/2015] amoxicillin (AMOXIL) capsule 500 mg, 500 mg, Oral, Q8H, Venus Standard, CNM .  calcium carbonate (TUMS - dosed in mg elemental calcium) chewable tablet 400 mg of elemental calcium, 2 tablet, Oral, Q4H PRN, Venus Standard, CNM .  docusate sodium (COLACE) capsule 100 mg, 100 mg, Oral, Daily, Venus Standard, CNM, 100 mg at 12/08/15 2019 .  [START ON 12/10/2015] hydroxyprogesterone caproate (DELALUTIN) 250 mg/mL injection 250 mg, 250 mg, Intramuscular, Weekly, Sherre ScarletKimberly Anneke Cundy, CNM .  lactated ringers infusion, , Intravenous, Continuous, Osborn CohoAngela Roberts, MD, Last Rate: 125 mL/hr at 12/09/15 0428 .  prenatal multivitamin tablet 1 tablet, 1 tablet, Oral, Q1200, Venus  Standard, CNM, 1 tablet at 12/08/15 2020 .  zolpidem (AMBIEN) tablet 5 mg, 5 mg, Oral, QHS PRN, Venus Standard, CNM, 5 mg at 12/08/15 2315   A: 6374w4d with PPROM; no concerns for maternal &/or fetal infection Incompetent cervix - cerclage in place since 09/01/15; intact Uterine irritability FWB: Reassuring FHRT for this GA H/O PTB at 34 wks in 2013 after PPROM at 28 wks  P: Continue latency antibiotics      Continue weekly 17 P injections -- next dose due 12/10/15 -- med begun at 16.5 wks (09/09/15)      Upcoming consults: MFM and Neonatology      Dr. Su Hiltoberts updated on status  Sherre ScarletWILLIAMS, Burleigh Brockmann CNM, MS 12/08/2015 10:21 PM

## 2015-12-08 NOTE — H&P (Signed)
Sarah Sutton is a 25 y.o. female, G2 P0101 at 29.4 weeks by US9/20/16  presented to MAU SROM at 0200  Patient Active Problem List   Diagnosis Date Noted  . Preterm premature rupture of membranes (PPROM) with unknown onset of labor 12/08/2015  . Nuchal cord, delivered, current hospitalization 10/01/2012  . Prolonged rupture of membranes 09/30/2012  . Cervical cerclage removed 09/30/12 09/30/2012  . NSVD (normal spontaneous vaginal delivery) 09/30/2012  . Risk of preterm labor--+ FFN 9/6. 08/18/2012  . ROM (rupture of membranes), premature, with cerclage in place 08/18/2012  . Right fetal pyelectasis 08/18/2012  . Cervical shortening complicating pregnancy--cerclage placed 07/01/12 06/28/2012  . Abnormal quad screen 06/19/2012  . Obesity 04/15/2012  . 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 Polycystic ovarian syndrome 06/16/2011  . Galactorrhea on right side 06/16/2011  . Trichomonas contact, treated 06/16/2011    Pregnancy Course: Patient entered care at 7.2 weeks.   EDC of 02/19/16 was established by [place].      [place] evaluations:   16.5 weeks - CERCLAGE IN PLACE . CX IS DYANAMIC 2.2-2.7 CM  17.5 weeks - CX CLOSED, MEASURES 1.8-2.1 CM   18.4 weeks - Cervical length 1.31cm (previously 1.8-2.1cm).     19.5 weeks - NORMAL ANATOMY BUT NEEDS COMPLETION OF SPINE, VENTRICLES. CERVIX 3.74CM.   21.4 weeks - cervix 1.4 cm with funneling Cerclage   22.3 weeks - Cervix measures 1.0-1.2 cm with funneling  23.5 weeks - CL 1.0- 1.4 CM  25.5 weeks -  Breech presentation. Posterior placenta, Hypoechoic area seen in anterior uterus, measures 5.8*5.2*2.7 cm  26.5 weeks - CERVIX TODAY 0.2cm, CLERCLAGE NOTED     Significant prenatal events:   SP PPROM at 28 weeks PTD at 34 week., cervical incompentence, SCH, Cerclage at 15 week,TSH dependence  Last evaluation:   28.6 weeks    Reason for admission:  PPROM 12/08/15 at 0200  Pt States:   Contractions Frequency: none         Contraction severity: n/a          Fetal activity: +FM  OB History    Gravida Para Term Preterm AB TAB SAB Ectopic Multiple Living   2 1 0 1 0 0 0 0 0 1      Past Medical History  Diagnosis Date  . Polycystic ovarian syndrome 2009    by Dr. Gaynell Face  . Anemia     NO MEDS IN PAST  . Infection     UTI;NOT FREQ  . Infection     YEAST INF;NOT FREQ  . Infection     BV X 1;CURRENTLY TAKING FLAGYL FOR EX  . Abnormal Pap smear 2007    COLPO DONE;LAST PAP 03/2011 WAS NORMAL  . NSVD (normal spontaneous vaginal delivery) 09/30/2012    Preterm delivery at 34 weeks; IOL for PPROM  . Heart murmur     AS A CHILD, never caused any problems  . Vaginal Pap smear, abnormal   . Preterm labor    Past Surgical History  Procedure Laterality Date  . Cholecystectomy  november 2009  . Colposcopy  2009  . Wisdom tooth extraction      ALL 4 REMOVED  . Cervical cerclage  07/01/2012    Procedure: CERCLAGE CERVICAL;  Surgeon: Esmeralda Arthur, MD;  Location: [facility];  Service: Gynecology;  Laterality: N/A;  . Cervical cerclage N/A 09/01/2015    Procedure: CERCLAGE CERVICAL;  Surgeon: Jaymes Graff, MD;  Location: [facility];  Service: Gynecology;  Laterality: N/A;  Family History: family history includes Asthma in her mother; Cancer in her maternal grandmother and paternal grandmother; Diabetes in her father; Hypertension in her father. Social History:  reports that she quit smoking about 5 months ago. Her smoking use included Cigarettes. She has a 5 pack-year smoking history. She has never used smokeless tobacco. She reports that she uses illicit drugs (Marijuana). She reports that she does not drink alcohol.   Prenatal Transfer Tool  Maternal Diabetes: No Genetic Screening: Normal Maternal Ultrasounds/Referrals: Normal Fetal Ultrasounds or other Referrals:  None Maternal Substance Abuse:  No Significant Maternal Medications:  Meds include: Other: 17P Significant Maternal Lab Results: None   ROS:  See HPI above, all other systems are  negative  No Known Allergies  Dilation: Closed (cerclage in place) Exam by:: V. Standard, CNM Blood pressure 145/75, pulse 94, temperature 98.2 F (36.8 C), temperature source Oral, resp. rate 18, last menstrual period 06/07/2015, SpO2 99 %.  Maternal Exam:  Uterine Assessment: Contraction frequency is rare.  Abdomen: Gravid, non tender. Fundal height is aga.  Normal external genitalia, vulva, cervix, uterus and adnexa.  No lesions noted on exam.  Pelvis adequate for delivery.    Physical Exam: Nursing note and vitals reviewed General: alert and cooperative She appears well nourished Psychiatric: Normal mood and affect. Her behavior is normal Head: Normocephalic Eyes: Pupils are equal, round, and reactive to light Neck: Normal range of motion Cardiovascular: RRR without murmur  Respiratory: CTAB. Effort normal  Abd: soft, non-tender, +BS, no rebound, no guarding  Genitourinary: Vagina normal  Neurological: A&Ox3 Skin: Warm and dry  Musculoskeletal: Normal range of motion  Homan's sign negative bilaterally No evidence of DVTs.  Edema:Minimal bilaterally non-pitting edema DTR: 2+ Clonus: None   Prenatal labs: ABO, Rh:  A positive Antibody:  negative Rubella:  immune RPR:   NR HBsAg:   negative HIV:   NR GBS:  pending Sickle cell/Hgb electrophoresis:  WNL Pap:   GC:   Negative Chlamydia: Negative Genetic screenings:   Glucola:  wnl  Assessment:  IUP at 29.4 weeks NICHD: Category 1 Membranes: SROM x 0200hrs GBS pending Diagnosis: PPROM  Plan:  Admit to Antepartum unit Routing [facility] AP orders IV pain medication per orders Prophylaxis abx if necessary Proceed with delivery at 34 week Diet: regular Neonatology Consult MFM Consult    Venus Standard, CNM, MSN 12/08/2015, 1:22 PM

## 2015-12-08 NOTE — Progress Notes (Signed)
Dr. Su Hiltoberts notified of fhr.

## 2015-12-09 MED ORDER — AZITHROMYCIN 250 MG PO TABS: 1000.0000 mg | ORAL_TABLET | Freq: Every day | ORAL | Status: DC

## 2015-12-09 MED ORDER — AZITHROMYCIN 250 MG PO TABS
1000.0000 mg | ORAL_TABLET | Freq: Once | ORAL | Status: AC
  Administered 2015-12-09: 1000 mg via ORAL
  Filled 2015-12-09: qty 4

## 2015-12-09 NOTE — Progress Notes (Addendum)
Antepartum LOS: 1 Sarah Sutton, 25 y.o.,   OB History    Gravida Para Term Preterm AB TAB SAB Ectopic Multiple Living   2 1 0 1 0 0 0 0 0 1       Subjective -Patient in bathroom.  Reports continued leaking of fluid that is clear.  Endorses good fetal movement and denies VB and contractions.  Patient enquires about POC stating she was told she was to have an [place] for cervical length. Patient further requests to be removed from continuous fetal monitoring.   Objective  Filed Vitals:   12/09/15 0639 12/09/15 0644 12/09/15 0649 12/09/15 0814  BP:    139/73  Pulse: 79 75 79 88  Temp:    97.9 F (36.6 C)  TempSrc:    Oral  Resp:    16  Height:      Weight:      SpO2: 99% 99% 99%     Results for orders placed or performed during the hospital encounter of 12/08/15 (from the past 24 hour(s))  Urinalysis, Routine w reflex microscopic (not at [facility])     Status: Abnormal   Collection Time: 12/08/15 10:15 AM  Result Value Ref Range   Color, Urine YELLOW YELLOW   APPearance CLOUDY (A) CLEAR   Specific Gravity, Urine 1.020 1.005 - 1.030   pH 7.0 5.0 - 8.0   Glucose, UA NEGATIVE NEGATIVE mg/dL   Hgb urine dipstick TRACE (A) NEGATIVE   Bilirubin Urine NEGATIVE NEGATIVE   Ketones, ur NEGATIVE NEGATIVE mg/dL   Protein, ur 30 (A) NEGATIVE mg/dL   Nitrite NEGATIVE NEGATIVE   Leukocytes, UA SMALL (A) NEGATIVE  Urine microscopic-add on     Status: Abnormal   Collection Time: 12/08/15 10:15 AM  Result Value Ref Range   Squamous Epithelial / LPF 6-30 (A) NONE SEEN   WBC, UA 6-30 0 - 5 WBC/hpf   RBC / HPF 0-5 0 - 5 RBC/hpf   Bacteria, UA FEW (A) NONE SEEN  Culture, beta strep (group b only)     Status: None (Preliminary result)   Collection Time: 12/08/15 12:30 PM  Result Value Ref Range   Specimen Description VAGINAL/RECTAL    Special Requests NONE    Culture      Culture reincubated for better growth Performed at [facility]    Report Status PENDING   Type and screen [facility]     Status: None   Collection Time: 12/08/15 12:37 PM  Result Value Ref Range   ABO/RH(D) A POS    Antibody Screen NEG    Sample Expiration 12/11/2015   Group B strep by PCR     Status: None   Collection Time: 12/08/15 10:00 PM  Result Value Ref Range   Group B strep by PCR NEGATIVE NEGATIVE    Meds: Scheduled Meds: . ampicillin (OMNIPEN) IV  2 g Intravenous Q6H   Followed by  . [START ON 12/10/2015] amoxicillin  500 mg Oral Q8H  . docusate sodium  100 mg Oral Daily  . [START ON 12/10/2015] hydroxyprogesterone caproate  250 mg Intramuscular Weekly  . prenatal multivitamin  1 tablet Oral Q1200   Continuous Infusions: . lactated ringers 125 mL/hr at 12/09/15 0428   PRN Meds:.acetaminophen, calcium carbonate, zolpidem   Physical Exam  Constitutional: She is oriented to person, place, and time. She appears well-developed and well-nourished. No distress.  HENT:  Head: Normocephalic and atraumatic.  Eyes: Conjunctivae are normal.  Neck: Normal range of  motion.  Cardiovascular: Normal rate, regular rhythm and normal heart sounds.   Pulmonary/Chest: Effort normal and breath sounds normal.  Abdominal: Soft. Bowel sounds are normal.  Genitourinary:  Peripad in place-Dry, patient reports recent change.  Musculoskeletal: Normal range of motion.  Neurological: She is alert and oriented to person, place, and time. She has normal reflexes.  Skin: Skin is warm and dry.  Psychiatric: She has a normal mood and affect. Her behavior is normal.  Vitals reviewed. :   Monitoring Type:Continuous Time:0900-0920 FHR: 145 bpm, Mod Var, +Small Variable Decels, -Accels UC: None graphed  Assessment IUP at 5759w5d Reassuring FHT PPROM Incompetent Cervix Cerclage (10/5) Afebrile Latency Antibiotics  Plan Patient informed that she will not have routine CL measurements while inpatient Informed that we may consider US for BPP, AFI, and EFW if recommended by MFM or  attending MD Continuous monitoring until otherwise specified by MD Consider adding additional antibiotic to latency regime Continue current plan of care Upcoming Treatments/Tests: MFM and Neonatology Consult, 17-P injection 1/13 Dr.SR to follow as appropriate  Sarah RobinsJessica L Noelle Sease, MSN, CNM 12/09/2015, 9:42 AM    Addendum Dr. Lynford HumphreySR updated and advised Give patient one dose of zithromax 1000mg  PO and repeat in 5 days Await MFM consult recommendations and implement as appropriate  Sarah RobinsJessica L Cloa Bushong MSN, CNM 12/09/2015 1:25 PM

## 2015-12-09 NOTE — Progress Notes (Signed)
Pt c/o tightening in stomach very quick then goes away. No contractions seen on monitor, toco readjusted. Will continue to monitor.

## 2015-12-10 ENCOUNTER — Ambulatory Visit (HOSPITAL_COMMUNITY): Payer: Medicaid Other

## 2015-12-10 ENCOUNTER — Inpatient Hospital Stay (HOSPITAL_COMMUNITY): Payer: Medicaid Other

## 2015-12-10 LAB — CBC WITH DIFFERENTIAL/PLATELET
BASOS PCT: 0 %
Basophils Absolute: 0 10*3/uL (ref 0.0–0.1)
EOS ABS: 0.2 10*3/uL (ref 0.0–0.7)
Eosinophils Relative: 2 %
HEMATOCRIT: 33.6 % — AB (ref 36.0–46.0)
Hemoglobin: 11.4 g/dL — ABNORMAL LOW (ref 12.0–15.0)
Lymphocytes Relative: 34 %
Lymphs Abs: 2.8 10*3/uL (ref 0.7–4.0)
MCH: 30 pg (ref 26.0–34.0)
MCHC: 33.9 g/dL (ref 30.0–36.0)
MCV: 88.4 fL (ref 78.0–100.0)
MONO ABS: 0.5 10*3/uL (ref 0.1–1.0)
MONOS PCT: 6 %
Neutro Abs: 4.8 10*3/uL (ref 1.7–7.7)
Neutrophils Relative %: 58 %
Platelets: 288 10*3/uL (ref 150–400)
RBC: 3.8 MIL/uL — ABNORMAL LOW (ref 3.87–5.11)
RDW: 13 % (ref 11.5–15.5)
WBC: 8.3 10*3/uL (ref 4.0–10.5)

## 2015-12-10 LAB — CULTURE, BETA STREP (GROUP B ONLY)

## 2015-12-10 NOTE — Consult Note (Signed)
MFM Consult, staff note  1.Cerclage in-situ in setting of pPROM, no contractions, no pain, and no active bleeding from cerclage site: This is a commonly encountered situation and an intact cerclage is beneficial for this patient at this very preterm gestational age.  She understands that the cerclage is not under any tension, she is not in active labor currently, and she is not actively abrupting or bleeding at all from the cerclage site. She understands that the cerclage is theoretically providing support and aid of her cervix in it's ability to continue to maintain the pregnancy in utero in absence of infection/labor/evolving abruption.   She also understands risk that she could rapidly progress in labor or have an acute evolution of a placental abruption. There is no sonographic evidence of abruption. If she rapidly progresses into labor or begins to definitively labor with evidence of tension on the cerclage, bleeding from the cervix/cerclage site or painfully contract, I recommend the cerclage be removed immediately.   She desires to keep the cerclage in situ until labor/infection/active vaginal bleeding and understands the risk including hemorrhage, or injury to cervix. She understands that cerclage removal will be more difficult if under tension. She also understands that removal now may lead to bleeding, infection, injury of cervix and potentially expedite very preterm delivery. I spent considerable time at bedside weighing the options. That being said, she does not desire cerclage removal at this time.  Antenatal corticosteroid course (BMZ 12mg IM q24 h x 2 doses) was ordered and was appropriate.  2.Preterm premature rupture of membranes: First, I agree with latency antibiotic course for 7 days as already initiated.  Second, I explained to her that at this point our primary concern was for ascending intrauterine/intraamniotic infection, which poses risk to her as well as her fetus. She  understood that inpatient management was essential with serial examinations and fetal heart rate tracings to screen for onset of infection and that evidence of such would prompt delivery.   Rates of spontaneous delivery within 7 days of rupture is 50% and 75% within 28 days of rupture. I explained to her timing of delivery in absence of infection and in presence of reassuring fetal status has been debated historically and recently by many experts.   Regardless, I cited to her that the best available and most well-accepted timing of delivery in context of otherwise reassuring maternal-fetus status affected by pPROM is at [redacted] weeks gestational age. This most fully balances the risk of prematurity against that of an occult intrauterine infection. Occult intrauterine infection is the second leading cause of cerebral palsy as opposed to the leading cause which is, of course, prematurity. She seemed to grasp the concept as well as our specialty's rationale.  3. Tocolysis in context of pPROM, no clear evidence of abruption:  Should she begin to labor prior to 32 weeks, I would give MgSO4 for CP prophylaxis. I would continue this for 12-24 hours and add Procardia for tocolysis x 48 hours. The patient understands risks/benefits. If the patient has onset of bright red vaginal bleeding/clear abruption picture or tension on the cerclage stitch then I would discontinue tocolysis attempts and remove the cerclage.  All of your patient's questions were addressed to her satisfaction today. Although this was a lot of information to take in, she demonstrated excellent comprehension of my impression, recommendations, limitations of medical care in the preterm state, and the underlying rationale.  Summary of Recommendations:  1. Completion BMZ x 2.  2. Magnesium Sulfate neuroprophylaxis   x 12 hours if labor prior to 32 weeks 3 Latency antibiotics 7 day course: 48 hr IV and 5 days of PO. 4. SCD's or TED hose for DVT  prophylaxis; 5. NICU consultation  6. Would perform daily EFM  7. weekly ultrasound for position/AFI  8. Removal of cerclage if infection/abruption/labor/tension on stitch become apparent clinically 9. Timing of delivery should be anticipated by 34 weeks provided that testing remains reassuring and there is no evidence of chorioamnionitis; 10. I would deliver prior to 34 weeks for nonreassuring fetal testing, evidence of chorioamnionitis, or other evidence of maternal or fetal deterioration; 11. Interval growth should be continually reassessed every 2-3 weeks to provide ongoing dialogue with NICU for survival rates/prognosis. 12. The patient should be monitored by serial clinical exams, serial vital signs, daily EFM's (more often if clinically warranted), CBC only as clinically indicated (noting that WBC at or above 20,000 constitutes leukocytosis for pregnancy).  Please do not hesitate to contact me for further questions/input.  Time Spent:  I spent in excess of 40 minutes in consultation with this patient to review records, evaluate her case, and provide her with an adequate discussion and education. More than 50% of this time was spent in direct face-to-face counseling.  Thank you,  Shanquita Ronning Morgan Nilza Eaker   Emilia Kayes Morgan, MD, MS, FACOG Assistant Professor Section of Maternal-Fetal Medicine [employer]     

## 2015-12-10 NOTE — Progress Notes (Signed)
MFM Consult, staff note  1.Cerclage in-situ in setting of pPROM, no contractions, no pain, and no active bleeding from cerclage site: This is a commonly encountered situation and an intact cerclage is beneficial for this patient at this very preterm gestational age.  She understands that the cerclage is not under any tension, she is not in active labor currently, and she is not actively abrupting or bleeding at all from the cerclage site. She understands that the cerclage is theoretically providing support and aid of her cervix in it's ability to continue to maintain the pregnancy in utero in absence of infection/labor/evolving abruption.   She also understands risk that she could rapidly progress in labor or have an acute evolution of a placental abruption. There is no sonographic evidence of abruption. If she rapidly progresses into labor or begins to definitively labor with evidence of tension on the cerclage, bleeding from the cervix/cerclage site or painfully contract, I recommend the cerclage be removed immediately.   She desires to keep the cerclage in situ until labor/infection/active vaginal bleeding and understands the risk including hemorrhage, or injury to cervix. She understands that cerclage removal will be more difficult if under tension. She also understands that removal now may lead to bleeding, infection, injury of cervix and potentially expedite very preterm delivery. I spent considerable time at bedside weighing the options. That being said, she does not desire cerclage removal at this time.  Antenatal corticosteroid course (BMZ 12mg  IM q24 h x 2 doses) was ordered and was appropriate.  2.Preterm premature rupture of membranes: First, I agree with latency antibiotic course for 7 days as already initiated.  Second, I explained to her that at this point our primary concern was for ascending intrauterine/intraamniotic infection, which poses risk to her as well as her fetus. She  understood that inpatient management was essential with serial examinations and fetal heart rate tracings to screen for onset of infection and that evidence of such would prompt delivery.   Rates of spontaneous delivery within 7 days of rupture is 50% and 75% within 28 days of rupture. I explained to her timing of delivery in absence of infection and in presence of reassuring fetal status has been debated historically and recently by many experts.   Regardless, I cited to her that the best available and most well-accepted timing of delivery in context of otherwise reassuring maternal-fetus status affected by pPROM is at 2734 weeks gestational age. This most fully balances the risk of prematurity against that of an occult intrauterine infection. Occult intrauterine infection is the second leading cause of cerebral palsy as opposed to the leading cause which is, of course, prematurity. She seemed to grasp the concept as well as our specialty's rationale.  3. Tocolysis in context of pPROM, no clear evidence of abruption:  Should she begin to labor prior to 32 weeks, I would give MgSO4 for CP prophylaxis. I would continue this for 12-24 hours and add Procardia for tocolysis x 48 hours. The patient understands risks/benefits. If the patient has onset of bright red vaginal bleeding/clear abruption picture or tension on the cerclage stitch then I would discontinue tocolysis attempts and remove the cerclage.  All of your patient's questions were addressed to her satisfaction today. Although this was a lot of information to take in, she demonstrated excellent comprehension of my impression, recommendations, limitations of medical care in the preterm state, and the underlying rationale.  Summary of Recommendations:  1. Completion BMZ x 2.  2. Magnesium Sulfate neuroprophylaxis  x 12 hours if labor prior to 32 weeks 3 Latency antibiotics 7 day course: 48 hr IV and 5 days of PO. 4. SCD's or TED hose for DVT  prophylaxis; 5. NICU consultation  6. Would perform daily EFM  7. weekly ultrasound for position/AFI  8. Removal of cerclage if infection/abruption/labor/tension on stitch become apparent clinically 9. Timing of delivery should be anticipated by 34 weeks provided that testing remains reassuring and there is no evidence of chorioamnionitis; 10. I would deliver prior to 34 weeks for nonreassuring fetal testing, evidence of chorioamnionitis, or other evidence of maternal or fetal deterioration; 11. Interval growth should be continually reassessed every 2-3 weeks to provide ongoing dialogue with NICU for survival rates/prognosis. 12. The patient should be monitored by serial clinical exams, serial vital signs, daily EFM's (more often if clinically warranted), CBC only as clinically indicated (noting that WBC at or above 20,000 constitutes leukocytosis for pregnancy).  Please do not hesitate to contact me for further questions/input.  Time Spent:  I spent in excess of 40 minutes in consultation with this patient to review records, evaluate her case, and provide her with an adequate discussion and education. More than 50% of this time was spent in direct face-to-face counseling.  Thank you,  Louann Sjogren Gaynelle Arabian, Louann Sjogren, MD, MS, FACOG Assistant Professor Section of Maternal-Fetal Medicine [facility]

## 2015-12-10 NOTE — Progress Notes (Addendum)
Antepartum LOS: 2 Sarah Sutton, 25 y.o.,   OB History    Gravida Para Term Preterm AB TAB SAB Ectopic Multiple Living   2 1 0 1 0 0 0 0 0 1       Subjective -Patient reports frustrations with lack of MFM consultation yesterday.  Expresses concern that POC not implemented because of this delay as well as decreases in fetal heart rate.  Endorses fetal activity and denies VB.  Reports contractions throughout the night in upper abdominal area and continued loss of clear fluid.    Objective  Filed Vitals:   12/09/15 1910 12/09/15 2340 12/10/15 0747 12/10/15 0749  BP: 133/78 119/50  127/69  Pulse: 84  91 88  Temp: 98.5 F (36.9 C)  98.3 F (36.8 C)   TempSrc: Oral  Oral   Resp: 18  15   Height:      Weight:      SpO2:        No results found for this or any previous visit (from the past 24 hour(s)).  Meds: Scheduled Meds: . amoxicillin  500 mg Oral Q8H  . docusate sodium  100 mg Oral Daily  . hydroxyprogesterone caproate  250 mg Intramuscular Weekly  . prenatal multivitamin  1 tablet Oral Q1200   Continuous Infusions: . lactated ringers 125 mL/hr at 12/10/15 0756   PRN Meds:.acetaminophen, calcium carbonate, zolpidem   Physical Exam  Constitutional: She is oriented to person, place, and time. She appears well-developed.  Obese   HENT:  Head: Normocephalic and atraumatic.  Eyes: EOM are normal.  Neck: Normal range of motion.  Cardiovascular: Normal rate, regular rhythm and normal heart sounds.   Pulmonary/Chest: Effort normal and breath sounds normal.  Abdominal: Soft. Bowel sounds are normal.  Neurological: She is alert and oriented to person, place, and time.  Skin: Skin is warm and dry.  :   Monitoring Type:Continuous Time:0900-0930 FHR: 145 bpm, Mod Var, + Variable Decels, +Accels UC: None graphed  Assessment IUP at [redacted]w[redacted]d Reassuring FHT PPROM Cerclage 10/5  Plan Reassurances given regarding MFM consult; patient informed that POC is in place, but  collaboration with MFM requested for long-term plan Reassurances given regarding FHR; patient encouraged to change positions when noting prolonged decreases in FHR No Q/C Continue current plan of care Upcoming Treatments/Tests: MFM and Neonatology Consult, 17P Injection, Zithromax 1000g once on 12/14/2015 Dr.EK to follow as appropriate   Sarah Robins, MSN, CNM 12/10/2015, 10:08 AM    Addendum: [place] completed, results as follows: SIUP, Cephalic, Posterior Placenta, AFI 5.49cm, EFW 35%ile at 1275gr or 2lbs 13oz, but AC in 6%ile.  UAD Normal MFM consult complete and recommendations from Dr. Adonis Housekeeper as below:  Summary of Recommendations: 1. Completion BMZ x 2.  2. Magnesium Sulfate neuroprophylaxis x 12 hours if labor prior to 32 weeks 3 Latency antibiotics 7 day course: 48 hr IV and 5 days of PO. 4. SCD's or TED hose for DVT prophylaxis; 5. NICU consultation  6. Would perform daily EFM  7. weekly ultrasound for position/AFI  8. Removal of cerclage if infection/abruption/labor/tension on stitch become apparent clinically 9. Timing of delivery should be anticipated by 34 weeks provided that testing remains reassuring and there is no evidence of chorioamnionitis; 10. I would deliver prior to 34 weeks for nonreassuring fetal testing, evidence of chorioamnionitis, or other evidence of maternal or fetal deterioration; 11. Interval growth should be continually reassessed every 2-3 weeks to provide ongoing dialogue with NICU for survival rates/prognosis. 12.  The patient should be monitored by serial clinical exams, serial vital signs, daily EFM's (more often if clinically warranted), CBC only as clinically indicated (noting that WBC at or above 20,000 constitutes leukocytosis for pregnancy).  Sarah RobinsJessica L Emly MSN, CNM 12/10/2015 3:19 PM   12/10/15: [id number].  I saw and examined patient at bedside at about 2 PM and agree with above findings assessment and plan. I discussed with patient MFM  recommendations and plan of care. I reviewed NST today from about 9 AM to 3.30 pm, NST is overall reassuring with normal baseline, moderate variability and accelerations meeting the 10 x 10 criteria. Patient with few episodic variable and prolonged decelerations. As per patient's RN the decelerations have occurred mostly when patient is on her backside and resolve when patient changes position to the side. Will continue with NST 3 times a day.  Dr. Sallye Sutton.

## 2015-12-11 NOTE — Progress Notes (Signed)
Antepartum LOS: 3  Subjective -Patient resting comfortably in bed.  Feeling much better now that she has met with both NICU and MFM.  She reports no contactions, no VB, still leaking occasional fluid.  +FM.  No F/C/CP/SOB.  No acute complaints.  Objective  Filed Vitals:   12/10/15 1925 12/10/15 2025 12/10/15 2124 12/11/15 0010  BP: 122/76   124/64  Pulse: 87   76  Temp: 98.8 F (37.1 C)   98 F (36.7 C)  TempSrc: Oral   Oral  Resp: 16 18 18 18  Height:      Weight:      SpO2:       Meds: Scheduled Meds: . amoxicillin  500 mg Oral Q8H  . docusate sodium  100 mg Oral Daily  . hydroxyprogesterone caproate  250 mg Intramuscular Weekly  . prenatal multivitamin  1 tablet Oral Q1200   Continuous Infusions: . lactated ringers 125 mL/hr at 12/11/15 0020   PRN Meds:.acetaminophen, calcium carbonate, zolpidem   Physical Exam  Constitutional: She is oriented to person, place, and time. She appears well-developed.  Obese   HENT:  Head: Normocephalic and atraumatic.  Eyes: EOM are normal.  Neck: Normal range of motion.  Cardiovascular: Normal rate, regular rhythm and normal heart sounds.   Pulmonary/Chest: Effort normal and breath sounds normal.  Abdominal: Soft. She exhibits no distension. There is no tenderness. There is no rebound and no guarding.  Neurological: She is alert and oriented to person, place, and time.  Skin: Skin is warm and dry.  Psychiatric: She has a normal mood and affect.    Assessment FHT: 145, moderate variablity, + 10x10 accels, variable decels x1 Toco: no contractions   Assessment/Plan: 24yo G2P0101 @ [redacted]w[redacted]d admitted for PPROM with cerclage -FWB: Reassuring, continue EFM daily -No evidence of infections, remains asymptomatic -Last US performed on 1/13: vertex/posterior/EFW: 2#13oz (35%), normal dopplers.  -s/p MFM and NICU consult.  Summary of MFM recommendations below:  1. Completion BMZ x 2.  2. Magnesium Sulfate neuroprophylaxis x 12 hours if  labor prior to 32 weeks 3 Latency antibiotics 7 day course: 48 hr IV and 5 days of PO. 4. SCD's or TED hose for DVT prophylaxis; 5. NICU consultation  6. Would perform daily EFM  7. weekly ultrasound for position/AFI  8. Removal of cerclage if infection/abruption/labor/tension on stitch become apparent clinically 9. Timing of delivery should be anticipated by 34 weeks provided that testing remains reassuring and there is no evidence of chorioamnionitis; 10. I would deliver prior to 34 weeks for nonreassuring fetal testing, evidence of chorioamnionitis, or other evidence of maternal or fetal deterioration; 11. Interval growth should be continually reassessed every 2-3 weeks to provide ongoing dialogue with NICU for survival rates/prognosis. 12. The patient should be monitored by serial clinical exams, serial vital signs, daily EFM's (more often if clinically warranted), CBC only as clinically indicated (noting that WBC at or above 20,000 constitutes leukocytosis for pregnancy).   Continue with current plan as outlined.    Jennifer Ozan, DO [phone] (pager) [phone] (office)    

## 2015-12-11 NOTE — Consult Note (Signed)
Neonatology Consult  Note:  At the request of the patients obstetrician Dr. Mancel Bale I met with Sarah Sutton who is at 30 weeks currently with pregnancy complicated by PPROM on 6/14 at 29 4 weeks and incompetent cervix - cerclage in place since 09/01/15.  She has a history of PPROM at 28 weeks with delivery at 34 weeks.  Her daughter is now 27 years old and is doing well.   She received betamethasone 12/22-23.  She is currently receiving latency antibiotics and 17-P.    We reviewed initial delivery room management, including CPAP, [place], and low but certainly possible need for intubation for surfactant administration.  We discussed feeding immaturity and need for full po intake with multiple days of good weight gain and no apnea or bradycardia before discharge.  We reviewed increased risk of jaundice, infection, and temperature instability.   Discussed likely length of stay.  Thank you for allowing [place] to participate in her care.  Please call with questions.  Higinio Roger, DO  Neonatologist  The total length of face-to-face or floor / unit time for this encounter was 20 minutes.  Counseling and / or coordination of care was greater than fifty percent of the time.

## 2015-12-12 LAB — TYPE AND SCREEN
ABO/RH(D): A POS
ANTIBODY SCREEN: NEGATIVE

## 2015-12-12 NOTE — Progress Notes (Signed)
Antepartum LOS: 4  Subjective -Patient resting comfortably in bed.  She reported a headache overnight that required tylenol, no HA this am.  She reports no contactions, no VB.  Filled up one pad overnight due to fluid, but no further fluid loss this am.  +FM.  No F/C/CP/SOB.  No acute complaints.  Objective  Filed Vitals:   12/11/15 2101 12/11/15 2144 12/11/15 2228 12/12/15 0918  BP:    128/66  Pulse:    88  Temp:    98.2 F (36.8 C)  TempSrc:    Oral  Resp: 18 18 18 16   Height:      Weight:      SpO2:       Meds: Scheduled Meds: . amoxicillin  500 mg Oral Q8H  . docusate sodium  100 mg Oral Daily  . hydroxyprogesterone caproate  250 mg Intramuscular Weekly  . prenatal multivitamin  1 tablet Oral Q1200   Continuous Infusions: . lactated ringers 125 mL/hr at 12/11/15 0020   PRN Meds:.acetaminophen, calcium carbonate, zolpidem   Physical Exam  Constitutional: She is oriented to person, place, and time. She appears well-developed.  Obese   HENT:  Head: Normocephalic and atraumatic.  Eyes: EOM are normal.  Neck: Normal range of motion.  Cardiovascular: Normal rate, regular rhythm and normal heart sounds.   Pulmonary/Chest: Effort normal and breath sounds normal.  Abdominal: Soft. She exhibits no distension. There is no tenderness. There is no rebound and no guarding.  Neurological: She is alert and oriented to person, place, and time.  Skin: Skin is warm and dry.  Psychiatric: She has a normal mood and affect.    Assessment FHT: 145, moderate variablity, + 10x10 accels, no decels Toco: no contractions SVE: Deferred   Assessment/Plan: 16XW 24 G2P0101 @ 7615w1d admitted for PPROM with cerclage -FWB: Reassuring, continue EFM daily -No evidence of infections, remains asymptomatic -Last US performed on 1/13: vertex/posterior/EFW: 2#13oz (35%), normal dopplers.  -s/p MFM and NICU consult.  Summary of MFM recommendations below:  1. Completion BMZ x 2.  2. Magnesium  Sulfate neuroprophylaxis x 12 hours if labor prior to 32 weeks 3 Latency antibiotics 7 day course: 48 hr IV and 5 days of PO. 4. SCD's or TED hose for DVT prophylaxis; 5. NICU consultation  6. Would perform daily EFM  7. weekly ultrasound for position/AFI  8. Removal of cerclage if infection/abruption/labor/tension on stitch become apparent clinically 9. Timing of delivery should be anticipated by 34 weeks provided that testing remains reassuring and there is no evidence of chorioamnionitis; 10. I would deliver prior to 34 weeks for nonreassuring fetal testing, evidence of chorioamnionitis, or other evidence of maternal or fetal deterioration; 11. Interval growth should be continually reassessed every 2-3 weeks to provide ongoing dialogue with NICU for survival rates/prognosis. 12. The patient should be monitored by serial clinical exams, serial vital signs, daily EFM's (more often if clinically warranted), CBC only as clinically indicated (noting that WBC at or above 20,000 constitutes leukocytosis for pregnancy).   Continue with current plan as outlined.    Myna HidalgoJennifer Emmabelle Fear, DO [phone] (pager) [phone] (office)

## 2015-12-13 NOTE — Progress Notes (Addendum)
Hospital day # 5 pregnancy at [redacted]w[redacted]d--PPROM at 29 4/7 weeks, cerclage in situ since 09/01/15.  BMZ at 28 weeks.  S:  Doing well--"know what to expect this time, being patient with the process".  Feels she understands and is accepting of long-term hospitalization due to experience in last pregnancy of PPROM at 28 weeks and induction at 34 weeks.  That baby was in NICU x 12 days, has done well with no deficiencies. May want daily WC ride, if remains stable.      Perception of contractions: none      Vaginal bleeding: None       Vaginal discharge:  no significant change  O: BP 129/66 mmHg  Pulse 85  Temp(Src) 98.6 F (37 C) (Oral)  Resp 16  Ht 5\' 7"  (1.702 m)  Wt 106.142 kg (234 lb)  BMI 36.64 kg/m2  SpO2 99%  LMP 06/07/2015      Fetal tracings:  Category 1 on TID NST      Contractions:  None      Uterus non-tender      Extremities: no significant edema and no signs of DVT, SCD in place.          Labs:  T&S done 12/12/15                  Last CBC 12/10/15                  GBS negative 12/08/15 by PCR, negative by culture as well.       Meds:  . amoxicillin  500 mg Oral Q8H  . docusate sodium  100 mg Oral Daily  . hydroxyprogesterone caproate  250 mg Intramuscular Weekly  . prenatal multivitamin  1 tablet Oral Q1200  On day 4 of 7 of ATB for latency.  A: [redacted]w[redacted]d with PPROM at 29 4/7 weeks     Cerclage in situ     BMZ course at 28 weeks     Stable  P: Continue current plan of care--OK for daily WC ride, if remains stable.      Upcoming tests/treatments:  Weekly [place] for AFI/position/cervical assessment--next due 12/17/15                                                     [place] for interval growth q 2-3 weeks--next due 1/27 or 2/3, per MD consult                                                      T&S q 3 days--next due 12/15/15                                                     CBC prn if s/s of infection, with > or = 20K WBC dx of leukocytosis       Further plan of care per MFM:  Mg  neuroprophylaxis x 12 hours if labor prior to 32 weeks  Removal of cerclage if infection/abruption/labor/tension apparent      Delivery at 34 weeks, unless onset of labor/NRFHR/chorio, etc                     MDs will follow  Nigel BridgemanLATHAM, VICKI CNM, [place] 12/13/2015 11:47 AM   Seen and agreed

## 2015-12-13 NOTE — Progress Notes (Signed)
Patient voiced concerns regarding 4 episodes of watery stools since this morning. Called and spoke with Nigel BridgemanVicki Latham, CNM and she suggested patient follow a BRAT diet for now. No new medication orders given at this time. Patient was educated regarding the BRAT diet and was told to push decaffeinated fluids.

## 2015-12-14 NOTE — Progress Notes (Signed)
Hospital day # 6 pregnancy at [redacted]w[redacted]d--PPROM at 29 4/7 weeks, cerclage in situ since 09/01/15.  BMZ at 28 weeks.  S:  Doing well, but "a little impatient" with long-term hospitalization.  Misses daughter.      Perception of contractions: None      Vaginal bleeding: None       Vaginal discharge:  no significant change  O: BP 132/69 mmHg  Pulse 88  Temp(Src) 98.1 F (36.7 C) (Oral)  Resp 16  Ht  (1.702 m)  Wt 106.142 kg (234 lb)  BMI 36.64 kg/m2  SpO2 99%  LMP 06/07/2015      Fetal tracings:  Morning NST with scattered variables, single 2-3 min decel, but reactive before and after, with moderate variability throughout.  Afternoon NST reactive, no decels.        Contractions:   Single mild UC noted on afternoon NST      Uterus gravid and non-tender      Extremities: no significant edema and no signs of DVT          Labs: T&S 12/12/15, last CBC 12/10/15       Meds:  . amoxicillin  500 mg Oral Q8H  . docusate sodium  100 mg Oral Daily  . hydroxyprogesterone caproate  250 mg Intramuscular Weekly  . prenatal multivitamin  1 tablet Oral Q1200  On day 5 of 7 ATB course  A: [redacted]w[redacted]d with PPROM at 29 4/7 weeks     Cerclage     BMZ at 28 weeks     Stable  P: Continue current plan of care      Upcoming tests/treatments: Weekly [place] for AFI/position/cervical assessment--next due 12/17/15  [place] for interval growth q 2-3 weeks--next due 1/27 or 2/3, per MD consult   T&S q 3 days--next due 12/15/15  CBC prn if s/s of infection, with > or = 20K WBC dx of leukocytosis  Further plan of care per MFM: Mg neuroprophylaxis x 12 hours if labor prior to 32 weeks  Removal of cerclage if infection/abruption/labor/tension  apparent Delivery at 34 weeks, unless onset of labor/NRFHR/chorio, etc  MDs will follow  Nigel Bridgeman CNM, [place] 12/14/2015 6:01 PM

## 2015-12-15 ENCOUNTER — Inpatient Hospital Stay (HOSPITAL_COMMUNITY): Payer: Medicaid Other | Admitting: Anesthesiology

## 2015-12-15 ENCOUNTER — Encounter (HOSPITAL_COMMUNITY): Payer: Self-pay

## 2015-12-15 ENCOUNTER — Inpatient Hospital Stay (HOSPITAL_COMMUNITY): Payer: Medicaid Other

## 2015-12-15 ENCOUNTER — Encounter (HOSPITAL_COMMUNITY)
Admission: AD | Disposition: A | Discharge status: Home / Self Care | Payer: Self-pay | Source: Ambulatory Visit | Attending: Obstetrics and Gynecology

## 2015-12-15 HISTORY — PX: CERVICAL CERCLAGE: SHX1329

## 2015-12-15 LAB — COMPREHENSIVE METABOLIC PANEL
ALT: 16 U/L (ref 14–54)
ANION GAP: 9 (ref 5–15)
AST: 21 U/L (ref 15–41)
Albumin: 3 g/dL — ABNORMAL LOW (ref 3.5–5.0)
Alkaline Phosphatase: 67 U/L (ref 38–126)
BILIRUBIN TOTAL: 0.3 mg/dL (ref 0.3–1.2)
BUN: <5 mg/dL — ABNORMAL LOW (ref 6–20)
CHLORIDE: 103 mmol/L (ref 101–111)
CO2: 24 mmol/L (ref 22–32)
Calcium: 9.1 mg/dL (ref 8.9–10.3)
Creatinine, Ser: 0.44 mg/dL (ref 0.44–1.00)
GFR calc Af Amer: >60 mL/min (ref >60)
Glucose, Bld: 110 mg/dL — ABNORMAL HIGH (ref 65–99)
POTASSIUM: 3.3 mmol/L — AB (ref 3.5–5.1)
Sodium: 136 mmol/L (ref 135–145)
TOTAL PROTEIN: 6.6 g/dL (ref 6.5–8.1)

## 2015-12-15 LAB — CBC WITH DIFFERENTIAL/PLATELET
BASOS PCT: 0 %
Basophils Absolute: 0 10*3/uL (ref 0.0–0.1)
EOS PCT: 2 %
Eosinophils Absolute: 0.2 10*3/uL (ref 0.0–0.7)
HEMATOCRIT: 34.1 % — AB (ref 36.0–46.0)
Hemoglobin: 11.7 g/dL — ABNORMAL LOW (ref 12.0–15.0)
LYMPHS PCT: 22 %
Lymphs Abs: 2.7 10*3/uL (ref 0.7–4.0)
MCH: 30.1 pg (ref 26.0–34.0)
MCHC: 34.3 g/dL (ref 30.0–36.0)
MCV: 87.7 fL (ref 78.0–100.0)
MONO ABS: 1 10*3/uL (ref 0.1–1.0)
MONOS PCT: 9 %
NEUTROS ABS: 8.1 10*3/uL — AB (ref 1.7–7.7)
Neutrophils Relative %: 67 %
PLATELETS: 301 10*3/uL (ref 150–400)
RBC: 3.89 MIL/uL (ref 3.87–5.11)
RDW: 13.1 % (ref 11.5–15.5)
WBC: 12 10*3/uL — ABNORMAL HIGH (ref 4.0–10.5)

## 2015-12-15 LAB — TYPE AND SCREEN
ABO/RH(D): A POS
ANTIBODY SCREEN: NEGATIVE

## 2015-12-15 LAB — LACTATE DEHYDROGENASE: LDH: 63 U/L — AB (ref 98–192)

## 2015-12-15 LAB — PROTEIN / CREATININE RATIO, URINE
Creatinine, Urine: 392 mg/dL
PROTEIN CREATININE RATIO: 0.23 mg/mg{creat} — AB (ref 0.00–0.15)
Total Protein, Urine: 92 mg/dL

## 2015-12-15 LAB — URIC ACID: URIC ACID, SERUM: 2.7 mg/dL (ref 2.3–6.6)

## 2015-12-15 SURGERY — Surgical Case
Anesthesia: Regional

## 2015-12-15 MED ORDER — LACTATED RINGERS IV SOLN
INTRAVENOUS | Status: DC | PRN
  Administered 2015-12-15: 17:00:00 via INTRAVENOUS

## 2015-12-15 MED ORDER — LACTATED RINGERS IV BOLUS (SEPSIS): 300.0000 mL/kg | Freq: Once | INTRAVENOUS | Status: DC

## 2015-12-15 MED ORDER — PHENYLEPHRINE 40 MCG/ML (10ML) SYRINGE FOR IV PUSH (FOR BLOOD PRESSURE SUPPORT)
PREFILLED_SYRINGE | INTRAVENOUS | Status: AC
  Filled 2015-12-15: qty 10

## 2015-12-15 MED ORDER — ACETAMINOPHEN 325 MG PO TABS: 325.0000 mg | ORAL_TABLET | ORAL | Status: DC | PRN

## 2015-12-15 MED ORDER — HYDROMORPHONE HCL 1 MG/ML IJ SOLN: 0.2500 mg | INTRAMUSCULAR | Status: DC | PRN

## 2015-12-15 MED ORDER — OXYTOCIN 10 UNIT/ML IJ SOLN
INTRAMUSCULAR | Status: AC
  Filled 2015-12-15: qty 3

## 2015-12-15 MED ORDER — LACTATED RINGERS IV SOLN: INTRAVENOUS | Status: DC

## 2015-12-15 MED ORDER — PHENYLEPHRINE 8 MG IN D5W 100 ML (0.08MG/ML) PREMIX OPTIME
INJECTION | INTRAVENOUS | Status: DC | PRN
  Administered 2015-12-15: 60 ug/min via INTRAVENOUS

## 2015-12-15 MED ORDER — BISACODYL 10 MG RE SUPP: 10.0000 mg | Freq: Every day | RECTAL | Status: DC | PRN

## 2015-12-15 MED ORDER — SIMETHICONE 80 MG PO CHEW: 80.0000 mg | CHEWABLE_TABLET | ORAL | Status: DC | PRN

## 2015-12-15 MED ORDER — ONDANSETRON HCL 4 MG/2ML IJ SOLN: 4.0000 mg | Freq: Once | INTRAMUSCULAR | Status: DC | PRN

## 2015-12-15 MED ORDER — MORPHINE SULFATE (PF) 0.5 MG/ML IJ SOLN
INTRAMUSCULAR | Status: AC
  Filled 2015-12-15: qty 10

## 2015-12-15 MED ORDER — MENTHOL 3 MG MT LOZG: 1.0000 | LOZENGE | OROMUCOSAL | Status: DC | PRN

## 2015-12-15 MED ORDER — ONDANSETRON HCL 4 MG/2ML IJ SOLN
INTRAMUSCULAR | Status: AC
  Filled 2015-12-15: qty 2

## 2015-12-15 MED ORDER — IBUPROFEN 600 MG PO TABS
600.0000 mg | ORAL_TABLET | Freq: Four times a day (QID) | ORAL | Status: DC
  Administered 2015-12-16 – 2015-12-18 (×10): 600 mg via ORAL
  Filled 2015-12-15 (×10): qty 1

## 2015-12-15 MED ORDER — MEASLES, MUMPS & RUBELLA VAC ~~LOC~~ INJ
0.5000 mL | INJECTION | Freq: Once | SUBCUTANEOUS | Status: DC
Start: 2015-12-16 — End: 2015-12-18
  Filled 2015-12-15: qty 0.5

## 2015-12-15 MED ORDER — PHENYLEPHRINE HCL 10 MG/ML IJ SOLN
INTRAMUSCULAR | Status: DC | PRN
  Administered 2015-12-15 (×2): 40 ug via INTRAVENOUS

## 2015-12-15 MED ORDER — BUPIVACAINE IN DEXTROSE 0.75-8.25 % IT SOLN
INTRATHECAL | Status: DC | PRN
  Administered 2015-12-15: 1.8 mL via INTRATHECAL

## 2015-12-15 MED ORDER — OXYTOCIN 10 UNIT/ML IJ SOLN
40.0000 [IU] | INTRAMUSCULAR | Status: DC | PRN
  Administered 2015-12-15: 40 [IU] via INTRAVENOUS

## 2015-12-15 MED ORDER — OXYTOCIN 10 UNIT/ML IJ SOLN: 2.5000 [IU]/h | INTRAVENOUS | Status: AC

## 2015-12-15 MED ORDER — MEPERIDINE HCL 25 MG/ML IJ SOLN
INTRAMUSCULAR | Status: AC
Start: 2015-12-15 — End: 2015-12-15
  Filled 2015-12-15: qty 1

## 2015-12-15 MED ORDER — CITRIC ACID-SODIUM CITRATE 334-500 MG/5ML PO SOLN
ORAL | Status: AC
  Administered 2015-12-15: 17:00:00
  Filled 2015-12-15: qty 15

## 2015-12-15 MED ORDER — CEFAZOLIN SODIUM-DEXTROSE 2-3 GM-% IV SOLR
INTRAVENOUS | Status: AC
  Filled 2015-12-15: qty 50

## 2015-12-15 MED ORDER — WITCH HAZEL-GLYCERIN EX PADS: 1.0000 "application " | MEDICATED_PAD | CUTANEOUS | Status: DC | PRN

## 2015-12-15 MED ORDER — DIPHENHYDRAMINE HCL 25 MG PO CAPS
25.0000 mg | ORAL_CAPSULE | Freq: Four times a day (QID) | ORAL | Status: DC | PRN
  Administered 2015-12-16: 25 mg via ORAL
  Filled 2015-12-15: qty 1

## 2015-12-15 MED ORDER — DIBUCAINE 1 % RE OINT: 1.0000 "application " | TOPICAL_OINTMENT | RECTAL | Status: DC | PRN

## 2015-12-15 MED ORDER — LACTATED RINGERS IV SOLN
INTRAVENOUS | Status: DC
  Administered 2015-12-16: 07:00:00 via INTRAVENOUS

## 2015-12-15 MED ORDER — ONDANSETRON HCL 4 MG/2ML IJ SOLN
INTRAMUSCULAR | Status: DC | PRN
  Administered 2015-12-15: 4 mg via INTRAVENOUS

## 2015-12-15 MED ORDER — ACETAMINOPHEN 325 MG PO TABS
650.0000 mg | ORAL_TABLET | ORAL | Status: DC | PRN
  Administered 2015-12-16: 650 mg via ORAL
  Filled 2015-12-15: qty 2

## 2015-12-15 MED ORDER — SENNOSIDES-DOCUSATE SODIUM 8.6-50 MG PO TABS
2.0000 | ORAL_TABLET | ORAL | Status: DC
  Administered 2015-12-16 – 2015-12-17 (×2): 2 via ORAL
  Filled 2015-12-15 (×2): qty 2

## 2015-12-15 MED ORDER — SIMETHICONE 80 MG PO CHEW
80.0000 mg | CHEWABLE_TABLET | ORAL | Status: DC
  Administered 2015-12-16 – 2015-12-17 (×2): 80 mg via ORAL
  Filled 2015-12-15 (×2): qty 1

## 2015-12-15 MED ORDER — ZOLPIDEM TARTRATE 5 MG PO TABS
5.0000 mg | ORAL_TABLET | Freq: Every evening | ORAL | Status: DC | PRN
  Administered 2015-12-17: 5 mg via ORAL
  Filled 2015-12-15: qty 1

## 2015-12-15 MED ORDER — SIMETHICONE 80 MG PO CHEW
80.0000 mg | CHEWABLE_TABLET | Freq: Three times a day (TID) | ORAL | Status: DC
  Administered 2015-12-16 – 2015-12-18 (×4): 80 mg via ORAL
  Filled 2015-12-15 (×4): qty 1

## 2015-12-15 MED ORDER — HYDROMORPHONE HCL 1 MG/ML IJ SOLN
1.0000 mg | INTRAMUSCULAR | Status: DC | PRN
  Administered 2015-12-15: 1 mg via INTRAVENOUS
  Filled 2015-12-15: qty 1

## 2015-12-15 MED ORDER — PRENATAL MULTIVITAMIN CH
1.0000 | ORAL_TABLET | Freq: Every day | ORAL | Status: DC
  Administered 2015-12-16 – 2015-12-18 (×3): 1 via ORAL
  Filled 2015-12-15 (×3): qty 1

## 2015-12-15 MED ORDER — KETOROLAC TROMETHAMINE 30 MG/ML IJ SOLN
30.0000 mg | Freq: Once | INTRAMUSCULAR | Status: AC
  Administered 2015-12-15: 30 mg via INTRAMUSCULAR

## 2015-12-15 MED ORDER — HYDROCODONE-ACETAMINOPHEN 5-325 MG PO TABS
1.0000 | ORAL_TABLET | ORAL | Status: DC | PRN
Start: 2015-12-15 — End: 2015-12-18
  Administered 2015-12-15 – 2015-12-16 (×2): 2 via ORAL
  Administered 2015-12-17 – 2015-12-18 (×3): 1 via ORAL
  Filled 2015-12-15 (×2): qty 1
  Filled 2015-12-15 (×2): qty 2
  Filled 2015-12-15: qty 1

## 2015-12-15 MED ORDER — FERROUS SULFATE 325 (65 FE) MG PO TABS
325.0000 mg | ORAL_TABLET | Freq: Two times a day (BID) | ORAL | Status: DC
  Administered 2015-12-16 – 2015-12-18 (×5): 325 mg via ORAL
  Filled 2015-12-15 (×5): qty 1

## 2015-12-15 MED ORDER — MEPERIDINE HCL 25 MG/ML IJ SOLN
INTRAMUSCULAR | Status: DC | PRN
  Administered 2015-12-15 (×2): 12.5 mg via INTRAVENOUS

## 2015-12-15 MED ORDER — FLEET ENEMA 7-19 GM/118ML RE ENEM: 1.0000 | ENEMA | Freq: Every day | RECTAL | Status: DC | PRN

## 2015-12-15 MED ORDER — SODIUM CHLORIDE 0.9 % IJ SOLN
INTRAMUSCULAR | Status: AC
  Filled 2015-12-15: qty 10

## 2015-12-15 MED ORDER — TETANUS-DIPHTH-ACELL PERTUSSIS 5-2.5-18.5 LF-MCG/0.5 IM SUSP: 0.5000 mL | Freq: Once | INTRAMUSCULAR | Status: DC

## 2015-12-15 MED ORDER — LACTATED RINGERS IV BOLUS (SEPSIS)
500.0000 mL | Freq: Once | INTRAVENOUS | Status: AC
  Administered 2015-12-15: 500 mL via INTRAVENOUS

## 2015-12-15 MED ORDER — PHENYLEPHRINE 8 MG IN D5W 100 ML (0.08MG/ML) PREMIX OPTIME
INJECTION | INTRAVENOUS | Status: AC
  Filled 2015-12-15: qty 100

## 2015-12-15 MED ORDER — ACETAMINOPHEN 160 MG/5ML PO SOLN: 325.0000 mg | ORAL | Status: DC | PRN

## 2015-12-15 MED ORDER — LANOLIN HYDROUS EX OINT: 1.0000 "application " | TOPICAL_OINTMENT | CUTANEOUS | Status: DC | PRN

## 2015-12-15 MED ORDER — FENTANYL CITRATE (PF) 100 MCG/2ML IJ SOLN
INTRAMUSCULAR | Status: AC
  Filled 2015-12-15: qty 2

## 2015-12-15 MED ORDER — KETOROLAC TROMETHAMINE 30 MG/ML IJ SOLN
INTRAMUSCULAR | Status: AC
  Filled 2015-12-15: qty 1

## 2015-12-15 MED ORDER — SCOPOLAMINE 1 MG/3DAYS TD PT72
MEDICATED_PATCH | TRANSDERMAL | Status: AC
  Filled 2015-12-15: qty 1

## 2015-12-15 MED ORDER — SODIUM CHLORIDE 0.9 % IR SOLN
Status: DC | PRN
  Administered 2015-12-15: 1000 mL

## 2015-12-15 SURGICAL SUPPLY — 37 items
APL SKNCLS STERI-STRIP NONHPOA (GAUZE/BANDAGES/DRESSINGS) ×1
BENZOIN TINCTURE PRP APPL 2/3 (GAUZE/BANDAGES/DRESSINGS) ×2 IMPLANT
CLAMP CORD UMBIL (MISCELLANEOUS) IMPLANT
CLOTH BEACON ORANGE TIMEOUT ST (SAFETY) ×2 IMPLANT
CONTAINER PREFILL 10% NBF 15ML (MISCELLANEOUS) IMPLANT
DRAIN JACKSON PRT FLT 10 (DRAIN) ×1 IMPLANT
DRAPE SHEET LG 3/4 BI-LAMINATE (DRAPES) IMPLANT
DRSG OPSITE POSTOP 4X10 (GAUZE/BANDAGES/DRESSINGS) ×2 IMPLANT
DURAPREP 26ML APPLICATOR (WOUND CARE) ×2 IMPLANT
ELECT REM PT RETURN 9FT ADLT (ELECTROSURGICAL) ×2
ELECTRODE REM PT RTRN 9FT ADLT (ELECTROSURGICAL) ×1 IMPLANT
EVACUATOR SILICONE 100CC (DRAIN) ×1 IMPLANT
EXTRACTOR VACUUM M CUP 4 TUBE (SUCTIONS) IMPLANT
GLOVE BIO SURGEON STRL SZ 6.5 (GLOVE) ×2 IMPLANT
GLOVE BIOGEL PI IND STRL 7.0 (GLOVE) ×2 IMPLANT
GLOVE BIOGEL PI INDICATOR 7.0 (GLOVE) ×2
GOWN STRL REUS W/TWL LRG LVL3 (GOWN DISPOSABLE) ×4 IMPLANT
KIT ABG SYR 3ML LUER SLIP (SYRINGE) IMPLANT
NDL HYPO 25X5/8 SAFETYGLIDE (NEEDLE) IMPLANT
NEEDLE HYPO 25X5/8 SAFETYGLIDE (NEEDLE) ×4 IMPLANT
NS IRRIG 1000ML POUR BTL (IV SOLUTION) ×2 IMPLANT
PACK C SECTION WH (CUSTOM PROCEDURE TRAY) ×2 IMPLANT
PAD OB MATERNITY 4.3X12.25 (PERSONAL CARE ITEMS) ×2 IMPLANT
PENCIL SMOKE EVAC W/HOLSTER (ELECTROSURGICAL) ×2 IMPLANT
RTRCTR C-SECT PINK 25CM LRG (MISCELLANEOUS) ×1 IMPLANT
SPONGE DRAIN TRACH 4X4 STRL 2S (GAUZE/BANDAGES/DRESSINGS) ×1 IMPLANT
STRIP CLOSURE SKIN 1/2X4 (GAUZE/BANDAGES/DRESSINGS) ×2 IMPLANT
SUT CHROMIC 0 CT 1 (SUTURE) ×2 IMPLANT
SUT MNCRL AB 3-0 PS2 27 (SUTURE) ×2 IMPLANT
SUT PLAIN 2 0 (SUTURE) ×4
SUT PLAIN 2 0 XLH (SUTURE) ×2 IMPLANT
SUT PLAIN ABS 2-0 CT1 27XMFL (SUTURE) ×2 IMPLANT
SUT SILK 2 0 SH (SUTURE) ×1 IMPLANT
SUT VIC AB 0 CTX 36 (SUTURE) ×8
SUT VIC AB 0 CTX36XBRD ANBCTRL (SUTURE) ×4 IMPLANT
TOWEL OR 17X24 6PK STRL BLUE (TOWEL DISPOSABLE) ×2 IMPLANT
TRAY FOLEY CATH SILVER 14FR (SET/KITS/TRAYS/PACK) ×2 IMPLANT

## 2015-12-15 NOTE — Progress Notes (Addendum)
Reviewed repeat strip done after patient ate breakfast. Baseline 160-170, with frequent very quick, shallow variables, then 2 moderate variables and 1 late decel after UC that patient was able to perceive.  Filed Vitals:   12/15/15 0918 12/15/15 1100 12/15/15 1223 12/15/15 1226  BP: 146/77   150/68  Pulse: 93   87  Temp:   98.3 F (36.8 C)   TempSrc:   Oral   Resp:   16   Height:      Weight:  103.874 kg (229 lb)    SpO2:       Plan: IV hydration CBC with diff, T&S now (due today) BPP  Continuous EFM at present.  Nigel Bridgeman, CNM 12/15/15 2:45p  Addendum: Baseline still 160-170, no repetitive variable decels.  FHR  Results for orders placed or performed during the hospital encounter of 12/08/15 (from the past 24 hour(s))  Type and screen [facility]     Status: None (Preliminary result)   Collection Time: 12/15/15  2:50 PM  Result Value Ref Range   ABO/RH(D) A POS    Antibody Screen PENDING    Sample Expiration 12/18/2015   CBC with Differential/Platelet     Status: Abnormal   Collection Time: 12/15/15  2:50 PM  Result Value Ref Range   WBC 12.0 (H) 4.0 - 10.5 K/uL   RBC 3.89 3.87 - 5.11 MIL/uL   Hemoglobin 11.7 (L) 12.0 - 15.0 g/dL   HCT 40.9 (L) 81.1 - 91.4 %   MCV 87.7 78.0 - 100.0 fL   MCH 30.1 26.0 - 34.0 pg   MCHC 34.3 30.0 - 36.0 g/dL   RDW 78.2 95.6 - 21.3 %   Platelets 301 150 - 400 K/uL   Neutrophils Relative % 67 %   Neutro Abs 8.1 (H) 1.7 - 7.7 K/uL   Lymphocytes Relative 22 %   Lymphs Abs 2.7 0.7 - 4.0 K/uL   Monocytes Relative 9 %   Monocytes Absolute 1.0 0.1 - 1.0 K/uL   Eosinophils Relative 2 %   Eosinophils Absolute 0.2 0.0 - 0.7 K/uL   Basophils Relative 0 %   Basophils Absolute 0.0 0.0 - 0.1 K/uL   Filed Vitals:   12/15/15 0918 12/15/15 1100 12/15/15 1223 12/15/15 1226  BP: 146/77   150/68  Pulse: 93   87  Temp:   98.3 F (36.8 C)   TempSrc:   Oral   Resp:   16   Height:      Weight:  103.874 kg (229 lb)     SpO2:       Consulted with Dr. Normand Sloop. Will check PCR via clean catch urine, CMP, LDH, uric acid. If PCR elevated, will do cath to ensure specimen not contaminated by amniotic fluid.  Awaiting BPP.  Nigel Bridgeman, CNM 12/15/15 3:50p  Addendum: BPP reported from nursing staff as 0 at 1650. FHR 180. Dr. Normand Sloop notified at 1651--proceeded with Code Cesarean.  Nigel Bridgeman, CNM 12/15/15 1800

## 2015-12-15 NOTE — Progress Notes (Signed)
Initial Nutrition Assessment  DOCUMENTATION CODES:  Obesity unspecified  INTERVENTION:  Regular diet, snacks upon request  NUTRITION DIAGNOSIS:   Increased nutrient needs related to  (pregnancy and fetal growth requirements) as evidenced by  (30 weeks IUP).  GOAL:  Patient will meet greater than or equal to 90% of their needs  MONITOR:  Weight trends  REASON FOR ASSESSMENT:  Antenatal   ASSESSMENT:  30 4/7 weeks, PROM. Pre-preg weight 229 lbs, BMI 35.9. 5 lb weight gain. Reports good appetite and tolerance to diet  Diet Order:  Diet regular Room service appropriate?: Yes; Fluid consistency:: Thin  Skin:  Reviewed, no issues  Height:   Ht Readings from Last 1 Encounters:  12/08/15  (1.702 m)   Weight:   Wt Readings from Last 1 Encounters:  12/15/15 229 lb (103.874 kg)    Ideal Body Weight:  61.3 kg  BMI:  Body mass index is 35.86 kg/(m^2).  Estimated Nutritional Needs:  Kcal:  2300-2500  Protein:  100-110 g  Fluid:  2.6 L  EDUCATION NEEDS:   No education needs identified at this time  Inez Pilgrim.Odis Luster LDN Neonatal Nutrition Support Specialist/RD III Pager [phone]      Phone [phone]

## 2015-12-15 NOTE — Progress Notes (Addendum)
Hospital day # 7 pregnancy at [redacted]w[redacted]d-PPROM at 29 4/7 weeks, cerclage in situ since 09/01/15. BMZ at 28 weeks.-  S:  Doing well--had questions about calculation of EDC.  "Want to make sure we are all on the same page".      Perception of contractions: None      Vaginal bleeding: None       Vaginal discharge:  no significant change       Reviewed EDC calculation by [place] at 11 5/7 week [place] at [facility] = 01/22/16.  Patient in agreement, "just      wanted to make sure.  She is anticipating induction at 34 weeks on 02/05/16, s/p cerclage removal.  O: BP 146/77 mmHg  Pulse 93  Temp(Src) 98.3 F (36.8 C) (Oral)  Resp 16  Ht  (1.702 m)  Wt 106.142 kg (234 lb)  BMI 36.64 kg/m2  SpO2 99%  LMP 06/07/2015      Fetal tracings:  Currently on NST--baseline 150-160, 10 beat accels, occasional very quick shallow      variables.  Reactive on evening NST, baseline      Contractions:  1 on current NST, with mild irritability--patient unaware of any uterine activity.      Uterus non-tender      Extremities: no significant edema and no signs of DVT          Labs:  T&S planned today      Meds:  . docusate sodium  100 mg Oral Daily  . hydroxyprogesterone caproate  250 mg Intramuscular Weekly  . prenatal multivitamin  1 tablet Oral Q1200  Completed 7 days of ATB  A: [redacted]w[redacted]d with PPROM at 29 4/7 weeks, cerclage in situ since 09/01/15. BMZ at 28 weeks.     Stable  P: Continue current plan of care      Repeat NST after patient has breakfast.     Upcoming tests/treatments: Weekly [place] for AFI/position/cervical assessment--next due 12/17/15  [place] for interval growth q 2-3 weeks--next due 1/27 or 2/3, per MD consult   T&S q 3 days--next due 12/15/15  CBC prn if s/s of infection, with > or = 20K WBC dx of leukocytosis  Further plan of care per MFM: Mg  neuroprophylaxis x 12 hours if labor prior to 32 weeks  Removal of cerclage if infection/abruption/labor/tension apparent Delivery at 34 weeks, unless onset of labor/NRFHR/chorio, etc.   Nigel Bridgeman CNM, [place] 12/15/2015 10:26 AM

## 2015-12-15 NOTE — Transfer of Care (Signed)
Immediate Anesthesia Transfer of Care Note  Patient: Sarah Sutton  Procedure(s) Performed: Procedure(s): CESAREAN SECTION (N/A) CERCLAGE CERVICAL removal (N/A)  Patient Location: PACU  Anesthesia Type:Spinal  Level of Consciousness: awake, alert  and oriented  Airway & Oxygen Therapy: Patient Spontanous Breathing  Post-op Assessment: Report given to RN and Post -op Vital signs reviewed and stable  Post vital signs: Reviewed and stable  Last Vitals:  Filed Vitals:   12/15/15 1226 12/15/15 1603  BP: 150/68 145/84  Pulse: 87 99  Temp:  36.7 C  Resp:  17    Complications: No apparent anesthesia complications

## 2015-12-15 NOTE — Progress Notes (Signed)
Breast pump x 10 min. No return. Hand expression on both sides

## 2015-12-15 NOTE — Anesthesia Procedure Notes (Signed)
Spinal Patient location during procedure: OR Staffing Anesthesiologist: Branae Crail Performed by: anesthesiologist  Preanesthetic Checklist Completed: patient identified, site marked, surgical consent, pre-op evaluation, timeout performed, IV checked, risks and benefits discussed and monitors and equipment checked Spinal Block Patient position: sitting Prep: ChloraPrep Patient monitoring: continuous pulse ox, blood pressure and heart rate Approach: midline Injection technique: single-shot Needle Needle type: Sprotte  Needle gauge: 24 G Needle length: 9 cm Additional Notes Functioning IV was confirmed and monitors were applied. Sterile prep and drape, including hand hygiene, mask and sterile gloves were used. The patient was positioned and the spine was prepped. The skin was anesthetized with lidocaine.  Free flow of clear CSF was obtained prior to injecting local anesthetic into the CSF.  The spinal needle aspirated freely following injection.  The needle was carefully withdrawn.  The patient tolerated the procedure well. Consent was obtained prior to procedure with all questions answered and concerns addressed. Risks including but not limited to bleeding, infection, nerve damage, paralysis, failed block, inadequate analgesia, allergic reaction, high spinal, itching and headache were discussed and the patient wished to proceed.   Sarah Hoffmann, MD     

## 2015-12-15 NOTE — Anesthesia Preprocedure Evaluation (Signed)
Anesthesia Evaluation  Patient identified by MRN, date of birth, ID band Patient awake    Reviewed: Allergy & Precautions, NPO status , Patient's Chart, lab work & pertinent test resultsPreop documentation limited or incomplete due to emergent nature of procedure.  History of Anesthesia Complications Negative for: history of anesthetic complications  Airway Mallampati: II  TM Distance: >3 FB Neck ROM: Full    Dental no notable dental hx. (+) Dental Advisory Given   Pulmonary former smoker,    Pulmonary exam normal breath sounds clear to auscultation       Cardiovascular negative cardio ROS Normal cardiovascular exam Rhythm:Regular Rate:Normal     Neuro/Psych negative neurological ROS  negative psych ROS   GI/Hepatic negative GI ROS, Neg liver ROS,   Endo/Other  obesity  Renal/GU negative Renal ROS  negative genitourinary   Musculoskeletal negative musculoskeletal ROS (+)   Abdominal   Peds negative pediatric ROS (+)  Hematology negative hematology ROS (+)   Anesthesia Other Findings   Reproductive/Obstetrics                             Anesthesia Physical Anesthesia Plan  ASA: II and emergent  Anesthesia Plan: Spinal   Post-op Pain Management:    Induction:   Airway Management Planned:   Additional Equipment:   Intra-op Plan:   Post-operative Plan:   Informed Consent: I have reviewed the patients History and Physical, chart, labs and discussed the procedure including the risks, benefits and alternatives for the proposed anesthesia with the patient or authorized representative who has indicated his/her understanding and acceptance.   Dental advisory given  Plan Discussed with:   Anesthesia Plan Comments:         Anesthesia Quick Evaluation

## 2015-12-15 NOTE — Anesthesia Postprocedure Evaluation (Signed)
Anesthesia Post Note  Patient: Sarah Sutton  Procedure(s) Performed: Procedure(s) (LRB): CESAREAN SECTION (N/A) CERCLAGE CERVICAL removal (N/A)  Patient location during evaluation: PACU Anesthesia Type: Spinal Level of consciousness: oriented and awake and alert Pain management: pain level controlled Vital Signs Assessment: post-procedure vital signs reviewed and stable Respiratory status: spontaneous breathing, respiratory function stable and patient connected to nasal cannula oxygen Cardiovascular status: blood pressure returned to baseline and stable Postop Assessment: no headache and no backache Anesthetic complications: no    Last Vitals:  Filed Vitals:   12/15/15 1808 12/15/15 1809  BP:    Pulse: 76 82  Temp:    Resp: 14 19    Last Pain:  Filed Vitals:   12/15/15 1809  PainSc: 0-No pain                 Yanelle Sousa

## 2015-12-15 NOTE — Progress Notes (Signed)
Sarah Sutton, [id number] 6 Hrs Post Op S/P Primary C/S for Failed Antenatal Testing s/p PPROM  Subjective: Nurse call reports patient experiencing pain despite toradol and dilaudid dosing.  In room to assess.  Patient sitting up eating regular diet and denies n/v.  Patient reports pain located at incision on right side and describes as sharp and stabbing.  Patient declines dilaudid PCA  stating dilaudid IV dose "made me cloudy headed and I don't like that feeling."  Patient reports minimal PO hydration stating "I am trying, but just distracted...they are making me eat, I'm not really hungry."  Reassurances given.  Patient able to reflect on events of today and expresses gratitude to staff and providers for quick response time and care during her "crazy" situation.  Patient further reports infant is doing well in NICU despite being on vent.  Patient very optimistic overall.  Objective: Filed Vitals:   12/15/15 1945 12/15/15 2000 12/15/15 2107 12/15/15 2208  BP: 121/63 136/63 144/66 126/60  Pulse: 109 87 104 95  Temp:  98.7 F (37.1 C) 99 F (37.2 C) 98.3 F (36.8 C)  TempSrc:  Oral Oral Axillary  Resp: Height:      Weight:      SpO2: 99% 100% 98% 100%    Physical Exam: -General appearance - alert, well appearing, and in no distress -Mental Status - normal mood, behavior, speech, dress, motor activity, and thought processes -Chest - clear to auscultation, no wheezes, rales or rhonchi, symmetric air entry  -Heart - normal rate and regular rhythm  -Abdomen -Negative Bowel Sounds, Soft, Appropriately Tender, Mildly Distended, [place] CDI -JP Drain in place with ~62mL of fluid -Foley catheter in place draining scant amt clear urine ~67mL in bag -Breasts - not examined  -Extremities - not examined  -Skin - Warm, Dry -SCD boots on and operational  Assessment 6 Hrs Post Op Hemodynamically Stable Pain Decreased UOP  Plan: Discussed advancement of diet and  movement Discussed bowel function Discussed pain management Will prescribe vicodin and patient encouraged to inform nurse if pain not responsive Give bolus dose now and reassess output Continue current care as ordered Dr. Jaclyn Prime to be updated on patient status   Cherre Robins, MSN, CNM 12/15/2015, 11:06 PM    Addendum [phone])  Nurse reports total output for the night was Continue mgmt as ordered  Cherre Robins MSN, CNM 12/16/2015 6:45 AM

## 2015-12-16 ENCOUNTER — Encounter (HOSPITAL_COMMUNITY): Payer: Self-pay | Admitting: Obstetrics and Gynecology

## 2015-12-16 DIAGNOSIS — Z98891 History of uterine scar from previous surgery: Secondary | ICD-10-CM

## 2015-12-16 LAB — CBC
HCT: 30.3 % — ABNORMAL LOW (ref 36.0–46.0)
Hemoglobin: 10.4 g/dL — ABNORMAL LOW (ref 12.0–15.0)
MCH: 30.2 pg (ref 26.0–34.0)
MCHC: 34.3 g/dL (ref 30.0–36.0)
MCV: 88.1 fL (ref 78.0–100.0)
PLATELETS: 240 10*3/uL (ref 150–400)
RBC: 3.44 MIL/uL — ABNORMAL LOW (ref 3.87–5.11)
RDW: 12.7 % (ref 11.5–15.5)
WBC: 12.5 10*3/uL — ABNORMAL HIGH (ref 4.0–10.5)

## 2015-12-16 MED ORDER — SODIUM CHLORIDE 0.9 % IV SOLN
3.0000 g | Freq: Four times a day (QID) | INTRAVENOUS | Status: DC
  Administered 2015-12-16 – 2015-12-17 (×3): 3 g via INTRAVENOUS
  Filled 2015-12-16 (×7): qty 3

## 2015-12-16 NOTE — Lactation Note (Signed)
This note was copied from the chart of Sarah Marlyn Tondreau. Lactation Consultation Note  Initial visit with mom.  Providing Breastmilk For Your Baby in NICU booklet provided.  Baby is 83 hours old.  Mom states she does not like the DEBP and prefers to use manual pump.  She states she isn't ready to start pumping.  Colostrum containers provided.  Encouraged to attempt to pump every 3 hours when she is feeling better and to call with concerns/assist prn.  Patient Name: Sarah Sutton [id number] Date: 12/16/2015     Maternal Data    Feeding    LATCH Score/Interventions                      Lactation Tools Discussed/Used     Consult Status      Huston Foley 12/16/2015, 11:06 AM

## 2015-12-16 NOTE — Op Note (Signed)
Cesarean Section Procedure Note   Sarah Sutton  12/08/2015 - 12/15/2015  Indications: Fetal Distress and BPP 0/8   Pre-operative Diagnosis: Cesarean Section with cerclage removal.   Post-operative Diagnosis: Same  Procedure:  STAT CS and removal of cerclage  Surgeon: Surgeon(s) and Role:    * Jaymes Graff, MD - Primary   Assistants: V.Latham CNM   Anesthesia: spinal   Procedure Details:  The patient was seen in the Holding Room. The risks, benefits, complications, treatment options, and expected outcomes were discussed with the patient. The patient concurred with the proposed plan, giving informed consent. identified as [name] and the procedure verified as C-Section Delivery. A Time Out was held and the above information confirmed.  After induction of anesthesia, the patient was draped and prepped in the usual sterile manner. A transverse incision was made and carried down through the subcutaneous tissue to the fascia. Fascial incision was made in the midline and extended transversely. The fascia was separated from the underlying rectus muscle superiorly and inferiorly. The peritoneum was identified and entered. Peritoneal incision was extended longitudinally with good visualization of bowel and bladder. The utero-vesical peritoneal reflection was incised transversely and the bladder flap was bluntly freed from the lower uterine segment.  An alexsis retractor was placed in the abdomen.   A low transverse uterine incision was made. Delivered from cephalic presentation was a  infant, with Apgar scores of 4 at one minute and and 8 at five minutes. Cord ph was sent the umbilical cord was clamped and cut cord blood was obtained for evaluation. The placenta was removed Intact and appeared normal. The uterine outline, tubes and ovaries appeared normal}. The uterine incision was closed with running locked sutures of 0Vicryl. A second layer 0 vicrlyl was used to imbricate the uterine incision     Hemostasis was observed. Lavage was carried out until clear. The alexsis was removed.  The peritoneum was closed with 0 chromic.  The muscles were examined and any bleeders were made hemostatic using bovie cautery device.   The fascia was then reapproximated with running sutures of 0 vicryl.  The subcutaneous tissue was reapproximated  With interrupted stitches using 2-0 plain gut. The subcuticular closure was performed using 3-70monocryl.    The patient was placed in stirrups.  A bivalve speculum was placed in the vagina.  The cerclage was removed without difficutly. The speculum was removed from the vagina     Instrument, sponge, and needle counts were correct prior the abdominal closure and were correct at the conclusion of the case.    Findings: infant was delivered from vtx presentation. The fluid was clear but scant.  The uterus tubes and ovaries appeared normal.     Estimated Blood Loss: 700   Total IV Fluids:   Urine Output: 200CC OF clear urine  Specimens: placenta to pathology  Complications: no complications  Disposition: PACU - hemodynamically stable.   Maternal Condition: stable   Baby condition / location:  NICU  Attending Attestation: I performed the procedure.   Signed: Surgeon(s): Jaymes Graff, MD

## 2015-12-16 NOTE — Anesthesia Postprocedure Evaluation (Signed)
Anesthesia Post Note  Patient: Sarah Sutton  Procedure(s) Performed: Procedure(s) (LRB): CESAREAN SECTION (N/A) CERCLAGE CERVICAL removal (N/A)  Patient location during evaluation: [facility] Anesthesia Type: Spinal Level of consciousness: awake Pain management: satisfactory to patient Vital Signs Assessment: post-procedure vital signs reviewed and stable Respiratory status: spontaneous breathing Cardiovascular status: stable Anesthetic complications: no    Last Vitals:  Filed Vitals:   12/16/15 0312 12/16/15 0520  BP: 135/71 118/62  Pulse:  84  Temp: 37 C 36.7 C  Resp: 18 18    Last Pain:  Filed Vitals:   12/16/15 0927  PainSc: 7                  Maelle Sheaffer

## 2015-12-16 NOTE — Progress Notes (Signed)
Subjective:  Postpartum Day 1 Cesarean Delivery  Patient reports tolerating PO and + flatus.  Has not voided yet.   Abdominal pain well controlled.  Her baby is stable in the NICU.      Objective: Vital signs in last 24 hours: Temp:  [98 F (36.7 C)-101.3 F (38.5 C)] 101.3 F (38.5 C) (01/19 1416) Pulse Rate:  [75-111] 105 (01/19 1416) Resp:  [12-27] 18 (01/19 1416) BP: (118-147)/(60-84) 147/71 mmHg (01/19 1416) SpO2:  [94 %-100 %] 100 % (01/19 1416)  Physical Exam:  General: alert, cooperative and no distress  CVS: s1, s2, RRR Pulm: CTAB Lochia: appropriate Uterine Fundus: firm, appropriately tender to palpation Incision: with dressing, clean, dry, intact.JP drain in place, 15 cc serosangenous drainage within.   DVT Evaluation: No evidence of DVT seen on physical exam. No significant calf/ankle edema.   Recent Labs  12/15/15 1450 12/16/15 0530  HGB 11.7* 10.4*  HCT 34.1* 30.3*    Assessment/Plan: Status post Cesarean section. Postoperative course complicated by post partum fever  -Start Unasyn, tylenol, close temperature f/u - Voiding trial. -out of bed and ambulation.  [facility] 12/16/2015, 2:49 PM

## 2015-12-17 LAB — RPR: RPR Ser Ql: NONREACTIVE

## 2015-12-17 NOTE — Lactation Note (Signed)
This note was copied from the chart of Sarah Willamae Demby. Lactation Consultation Note  Mom using manual pump per her choice.  No milk obtained.  Encouraged to continue pumping every 3 hours for breast stimulation.    Patient Name: Sarah Sutton [id number] Date: 12/17/2015     Maternal Data    Feeding    LATCH Score/Interventions                      Lactation Tools Discussed/Used     Consult Status      Huston Foley 12/17/2015, 10:25 AM

## 2015-12-17 NOTE — Progress Notes (Signed)
CSW has made attempts x 2 to meet with MOB to introduce services, offer support, and complete assessment, but she has not been in her room at these times.  CSW will attempt again at a later time to meet with MOB.

## 2015-12-17 NOTE — Progress Notes (Signed)
Came to see the pt and she is not in the room

## 2015-12-17 NOTE — Progress Notes (Signed)
Sarah Sutton [id number]  Subjective: Postpartum Day 2:  C/S due to fetal distress and BPP 0/8 Patient up ad lib, reports no syncope or dizziness. Feeding:  Breast, manual pump Contraceptive plan:  Patch or IUD Infant in NICU- pt in NICU most of the day      Objective: Temp:  [97.8 F (36.6 C)-99.1 F (37.3 C)] 98.6 F (37 C) (01/20 1718) Pulse Rate:  [89-98] 93 (01/20 1718) Resp:  [12-18] 12 (01/20 1718) BP: (98-128)/(43-68) 98/43 mmHg (01/20 1718) SpO2:  [99 %-100 %] 100 % (01/20 1718)  CBC Latest Ref Rng 12/16/2015 12/15/2015 12/10/2015  WBC 4.0 - 10.5 K/uL 12.5(H) 12.0(H) 8.3  Hemoglobin 12.0 - 15.0 g/dL 10.4(L) 11.7(L) 11.4(L)  Hematocrit 36.0 - 46.0 % 30.3(L) 34.1(L) 33.6(L)  Platelets 150 - 400 K/uL 240 301 288     Physical Exam:  General: alert and cooperative Lochia: appropriate Uterine Fundus: firm Abdomen:  + bowel sounds, NT Incision: Honeycomb dressing CDI DVT Evaluation: No evidence of DVT seen on physical exam. Negative Homan's sign. JP drain:   5cc  Assessment/Plan: Status post cesarean delivery, day 2. Stable Continue current care.    Alphonzo Severance MSN, CNM 12/17/2015, 6:16 PM

## 2015-12-17 NOTE — Progress Notes (Signed)
Pt not in room, in NICU. Will return later to evaluate progress.

## 2015-12-18 DIAGNOSIS — O09219 Supervision of pregnancy with history of pre-term labor, unspecified trimester: Secondary | ICD-10-CM

## 2015-12-18 DIAGNOSIS — O3430 Maternal care for cervical incompetence, unspecified trimester: Secondary | ICD-10-CM | POA: Diagnosis present

## 2015-12-18 MED ORDER — HYDROCODONE-ACETAMINOPHEN 5-325 MG PO TABS: 1.0000 | ORAL_TABLET | ORAL | Status: DC | PRN

## 2015-12-18 MED ORDER — IBUPROFEN 600 MG PO TABS: 600.0000 mg | ORAL_TABLET | Freq: Four times a day (QID) | ORAL | Status: DC | PRN

## 2015-12-18 NOTE — Discharge Summary (Signed)
OB Discharge Summary     Patient Name: Sarah Sutton DOB: 1991/03/26 MRN: [id number]  Date of admission: 12/08/2015 Delivering MD: Jaymes Graff   Date of discharge: 12/18/2015  Admitting diagnosis: 30 WKS, WATER BROKE Intrauterine pregnancy: [redacted]w[redacted]d     Secondary diagnosis:  Principal Problem:   Status post primary low transverse cesarean section Active Problems:   Preterm premature rupture of membranes (PPROM) with unknown onset of labor   Cervical insufficiency in pregnancy, antepartum--cerclage placed 08/2015   Pregnancy complicated by previous preterm labor--2013, cerclage, PPROM at 28 weeks, delivery at 34 weeks by induction  Additional problems:  Patient Active Problem List   Diagnosis Date Noted  . Cervical insufficiency in pregnancy, antepartum--cerclage placed 08/2015 12/18/2015  . Pregnancy complicated by previous preterm labor--2013, cerclage, PPROM at 28 weeks, delivery at 34 weeks by induction 12/18/2015  . Status post primary low transverse cesarean section 12/16/2015  . Preterm premature rupture of membranes (PPROM) with unknown onset of labor 12/08/2015  . Obesity 04/15/2012  . 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 Polycystic ovarian syndrome 06/16/2011       Discharge diagnosis: Preterm Pregnancy Delivered and PPROM x 1 week, cerclage removal, primary LTCS due to fetal stress                                                                                                Post partum procedures:PP ATB x 4 doses  Augmentation: None  Complications: None  Hospital course:  Admitted 1/11/167 with SROM at 29 4/7 weeks, with cerclage in place since 09/01/15. Received betamethasone and 7 days of latency antibiotics.  Had changes in FHR on NST on 12/15/15, at 30 4/7 weeks, with elevated baseline and sporadic variables.  BPP was 0/10, with patient taken for emergent C/S by Dr. Normand Sloop.  Baby had Apgars of 4/8, weight 3 lbs, and was taken to NICU on a ventilator due to  prematurity.  Cerclage removed without difficulty after the C/S was completed.  Patient did spike a fever on day 1 to 101.3, and was given 4 doses of Unasyn, with resolution.  The rest of the patient's hospital course was uneventful--JP drain was removed on day 3, with minimal drainage noted.  Baby was stable in NICU on the day of patient's d/c, now on O2 by [place]. Patient anticipates using Mirena for contraception.  Patient was d/c'd home on 12/18/15 in good condition.  Physical exam  Filed Vitals:   12/17/15 0958 12/17/15 1718 12/17/15 2237 12/18/15 0518  BP: 124/68 98/43 138/74 120/69  Pulse: 97 93 93 78  Temp: 97.8 F (36.6 C) 98.6 F (37 C) 98.7 F (37.1 C) 97.9 F (36.6 C)  TempSrc: Oral Oral Oral Oral  Resp: Height:      Weight:      SpO2: 100% 100% 100% 100%   General: alert Lochia: appropriate Uterine Fundus: firm Incision: Dressing is clean, dry, and intact JP drain removed without difficulty. DVT Evaluation: No evidence of DVT seen on physical exam. Negative Homan's sign.  Labs:  CBC Latest Ref Rng 12/16/2015 12/15/2015 12/10/2015  WBC 4.0 - 10.5 K/uL 12.5(H) 12.0(H) 8.3  Hemoglobin 12.0 - 15.0 g/dL 10.4(L) 11.7(L) 11.4(L)  Hematocrit 36.0 - 46.0 % 30.3(L) 34.1(L) 33.6(L)  Platelets 150 - 400 K/uL 240 301 288   CMP Latest Ref Rng 12/15/2015 07/11/2015 06/21/2015  Glucose 65 - 99 mg/dL 742(V) 85 956(L)  BUN 6 - 20 mg/dL <8(V) 5(L) 6  Creatinine 0.44 - 1.00 mg/dL 5.64 3.32 9.51  Sodium 135 - 145 mmol/L 136 134(L) 136  Potassium 3.5 - 5.1 mmol/L 3.3(L) 3.5 3.6  Chloride 101 - 111 mmol/L 103 102 106  CO2 22 - 32 mmol/L Calcium 8.9 - 10.3 mg/dL 9.1 9.1 8.9  Total Protein 6.5 - 8.1 g/dL 6.6 - -  Total Bilirubin 0.3 - 1.2 mg/dL 0.3 - -  Alkaline Phos 38 - 126 U/L 67 - -  AST 15 - 41 U/L 21 - -  ALT 14 - 54 U/L 16 - -     Discharge instruction: per After Visit Summary and "Baby and Me Booklet". After visit meds:    Medication List    TAKE these  medications        HYDROcodone-acetaminophen 5-325 MG tablet  Commonly known as:  NORCO/VICODIN  Take 1-2 tablets by mouth every 4 (four) hours as needed for moderate pain.     ibuprofen 600 MG tablet  Commonly known as:  ADVIL,MOTRIN  Take 1 tablet (600 mg total) by mouth every 6 (six) hours as needed.     prenatal multivitamin Tabs tablet  Take 2 tablets by mouth daily at 12 noon.        Diet: routine diet  Activity: Advance as tolerated. Pelvic rest for 6 weeks.   Outpatient follow up:5 weeks Follow up Appt:No future appointments. Follow up Visit:No Follow-up on file.  Postpartum contraception: IUD Considering Mirena  Newborn Data: Live born female  Birth Weight: 3 lb 0.7 oz (1380 g) APGAR: 4, 6  Baby Feeding: Patient pumping for breastmilk for NICU infant Disposition:NICU--stable   12/18/2015 Nigel Bridgeman, CNM

## 2015-12-18 NOTE — Lactation Note (Signed)
This note was copied from the chart of Sarah Sutton. Lactation Consultation Note  Patient Name: Sarah Deryl Ports [id number] Date: 12/18/2015 Reason for consult: Follow-up assessment;Infant < 6lbs;NICU baby   Follow up with mom of 36 hour old NICU Infant. Mom to be D/C home today. Mom began pumping with a DEBP last night and now wishes to have a [facility] loaner pump before d/c. She has pumped 3 x's and received a gtt that she rubbed onto nipples. She is concerned with cracked nipples and was told to use expressed colostrum, olive oil , or coconut oil to nipples as needed. Gave mom [facility] loaner pump papers to fill out. WIC referral sent to [facility] office, mom to call [facility] office Monday to make a follow up appointment. Discussed supply and demand and normal progression of milk coming to volume, enc mom to pump every 2-3 hours with a 4-5 hour stretch at night for rest. Mom has a 3 yo at home to care for also. Enc mom to pump while visiting in NICU and to relax with pumping. Mom has bottles and labels to take home. Enc mom to call with questions/concerns as needed.    Maternal Data Formula Feeding for Exclusion: No Has patient been taught Hand Expression?: Yes Does the patient have breastfeeding experience prior to this delivery?: Yes  Feeding Feeding Type: Donor Breast Milk Length of feed: 30 min  LATCH Score/Interventions                      Lactation Tools Discussed/Used WIC Program: Yes [facility]) Pump Review: Setup, frequency, and cleaning;Milk Storage Initiated by:: Noralee Stain, RN, IBCLC and bedside RN Date initiated:: 12/18/15   Consult Status Consult Status: PRN    [name] 12/18/2015, 12:44 PM

## 2015-12-18 NOTE — Discharge Instructions (Signed)
Postpartum Care After Cesarean Delivery After you deliver your newborn (postpartum period), the usual stay in the hospital is 24-72 hours. If there were problems with your labor or delivery, or if you have other medical problems, you might be in the hospital longer.  While you are in the hospital, you will receive help and instructions on how to care for yourself and your newborn during the postpartum period.  While you are in the hospital:  It is normal for you to have pain or discomfort from the incision in your abdomen. Be sure to tell your nurses when you are having pain, where the pain is located, and what makes the pain worse.  If you are breastfeeding, you may feel uncomfortable contractions of your uterus for a couple of weeks. This is normal. The contractions help your uterus get back to normal size.  It is normal to have some bleeding after delivery.  For the first 1-3 days after delivery, the flow is red and the amount may be similar to a period.  It is common for the flow to start and stop.  In the first few days, you may pass some small clots. Let your nurses know if you begin to pass large clots or your flow increases.  Do not  flush blood clots down the toilet before having the nurse look at them.  During the next 3-10 days after delivery, your flow should become more watery and pink or brown-tinged in color.  Ten to fourteen days after delivery, your flow should be a small amount of yellowish-white discharge.  The amount of your flow will decrease over the first few weeks after delivery. Your flow may stop in 6-8 weeks. Most women have had their flow stop by 12 weeks after delivery.  You should change your sanitary pads frequently.  Wash your hands thoroughly with soap and water for at least 20 seconds after changing pads, using the toilet, or before holding or feeding your newborn.  Your intravenous (IV) tubing will be removed when you are drinking enough fluids.  The  urine drainage tube (urinary catheter) that was inserted before delivery may be removed within 6-8 hours after delivery or when feeling returns to your legs. You should feel like you need to empty your bladder within the first 6-8 hours after the catheter has been removed.  In case you become weak, lightheaded, or faint, call your nurse before you get out of bed for the first time and before you take a shower for the first time.  Within the first few days after delivery, your breasts may begin to feel tender and full. This is called engorgement. Breast tenderness usually goes away within 48-72 hours after engorgement occurs. You may also notice milk leaking from your breasts. If you are not breastfeeding, do not stimulate your breasts. Breast stimulation can make your breasts produce more milk.  Spending as much time as possible with your newborn is very important. During this time, you and your newborn can feel close and get to know each other. Having your newborn stay in your room (rooming in) will help to strengthen the bond with your newborn. It will give you time to get to know your newborn and become comfortable caring for your newborn.  Your hormones change after delivery. Sometimes the hormone changes can temporarily cause you to feel sad or tearful. These feelings should not last more than a few days. If these feelings last longer than that, you should talk to your  caregiver.  If desired, talk to your caregiver about methods of family planning or contraception.  Talk to your caregiver about immunizations. Your caregiver may want you to have the following immunizations before leaving the hospital:  Tetanus, diphtheria, and pertussis (Tdap) or tetanus and diphtheria (Td) immunization. It is very important that you and your family (including grandparents) or others caring for your newborn are up-to-date with the Tdap or Td immunizations. The Tdap or Td immunization can help protect your newborn  from getting ill.  Rubella immunization.  Varicella (chickenpox) immunization.  Influenza immunization. You should receive this annual immunization if you did not receive the immunization during your pregnancy.   This information is not intended to replace advice given to you by your health care provider. Make sure you discuss any questions you have with your health care provider.   Document Released: 08/07/2012 Document Reviewed: 08/07/2012 Elsevier Interactive Patient Education [employer]  Intrauterine Device Information An intrauterine device (IUD) is inserted into your uterus to prevent pregnancy. There are two types of IUDs available:   Copper IUD--This type of IUD is wrapped in copper wire and is placed inside the uterus. Copper makes the uterus and fallopian tubes produce a fluid that kills sperm. The copper IUD can stay in place for 10 years.  Hormone IUD--This type of IUD contains the hormone progestin (synthetic progesterone). The hormone thickens the cervical mucus and prevents sperm from entering the uterus. It also thins the uterine lining to prevent implantation of a fertilized egg. The hormone can weaken or kill the sperm that get into the uterus. One type of hormone IUD can stay in place for 5 years, and another type can stay in place for 3 years. Your health care provider will make sure you are a good candidate for a contraceptive IUD. Discuss with your health care provider the possible side effects.  ADVANTAGES OF AN INTRAUTERINE DEVICE  IUDs are highly effective, reversible, long acting, and low maintenance.   There are no estrogen-related side effects.   An IUD can be used when breastfeeding.   IUDs are not associated with weight gain.   The copper IUD works immediately after insertion.   The hormone IUD works right away if inserted within 7 days of your period starting. You will need to use a backup method of birth control for 7 days if the hormone  IUD is inserted at any other time in your cycle.  The copper IUD does not interfere with your female hormones.   The hormone IUD can make heavy menstrual periods lighter and decrease cramping.   The hormone IUD can be used for 3 or 5 years.   The copper IUD can be used for 10 years. DISADVANTAGES OF AN INTRAUTERINE DEVICE  The hormone IUD can be associated with irregular bleeding patterns.   The copper IUD can make your menstrual flow heavier and more painful.   You may experience cramping and vaginal bleeding after insertion.    This information is not intended to replace advice given to you by your health care provider. Make sure you discuss any questions you have with your health care provider.   Document Released: 10/17/2004 Document Revised: 07/16/2013 Document Reviewed: 05/04/2013 Elsevier Interactive Patient Education [employer]

## 2015-12-18 NOTE — Progress Notes (Signed)
D/C instructions and prescriptions reviewed with pt. Pt verbalized understanding. D/c home ambulatory, stable with family to private car.

## 2015-12-18 NOTE — Progress Notes (Signed)
Sarah Sutton [id number]  Subjective: Postpartum Day 3: Primary LTC/S and cerclage removal at 30 4/7weeks due to BPP 0--hx PPROM at 29 4/7 weeks. Patient has been up ad lib, has denied syncope or dizziness.  Has been in NICU frequently--baby stable per staff. Feeding:  Pumping for NICU infant Contraceptive plan:  IUD  Sleeping soundly at present.  Objective: Temp:  [97.8 F (36.6 C)-98.7 F (37.1 C)] 97.9 F (36.6 C) (01/21 0518) Pulse Rate:  [78-97] 78 (01/21 0518) Resp:  [12-18] 18 (01/21 0518) BP: (98-138)/(43-74) 120/69 mmHg (01/21 0518) SpO2:  [100 %] 100 % (01/21 0518)  Filed Vitals:   12/17/15 0958 12/17/15 1718 12/17/15 2237 12/18/15 0518  BP: 124/68 98/43 138/74 120/69  Pulse: 97 93 93 78  Temp: 97.8 F (36.6 C) 98.6 F (37 C) 98.7 F (37.1 C) 97.9 F (36.6 C)  TempSrc: Oral Oral Oral Oral  Resp: Height:      Weight:      SpO2: 100% 100% 100% 100%    CBC Latest Ref Rng 12/16/2015 12/15/2015 12/10/2015  WBC 4.0 - 10.5 K/uL 12.5(H) 12.0(H) 8.3  Hemoglobin 12.0 - 15.0 g/dL 10.4(L) 11.7(L) 11.4(L)  Hematocrit 36.0 - 46.0 % 30.3(L) 34.1(L) 33.6(L)  Platelets 150 - 400 K/uL 240 301 288   Completed 4 doses of Unasyn pp for fever.  Now afebrile > 24 hours.  JP drain:   14 cc last 24 hours.  Assessment/Plan: Status post cesarean delivery, day 3--30 4/7 weeks, PPROM x 1 week, BPP 0 Stable NICU infant Will see patient when she awakens.   Nigel Bridgeman MSN, CNM 12/18/2015, 8a

## 2016-06-10 IMAGING — US US OB TRANSVAGINAL
1 series · 14 of 28 positions shown · non-contrast
Comparison: 06/21/2015

CLINICAL DATA: First trimester pregnancy, bleeding

EXAM:
OBSTETRIC <14 WK ULTRASOUND
TECHNIQUE: Transvaginal ultrasound was performed for complete evaluation of the
gestation as well as the maternal uterus, adnexal regions, and
pelvic cul-de-sac. Transabdominal imaging was not performed.

[Series 1: us ob transvaginal · 54 acquisitions, 14 frames shown]
[im 2/54]
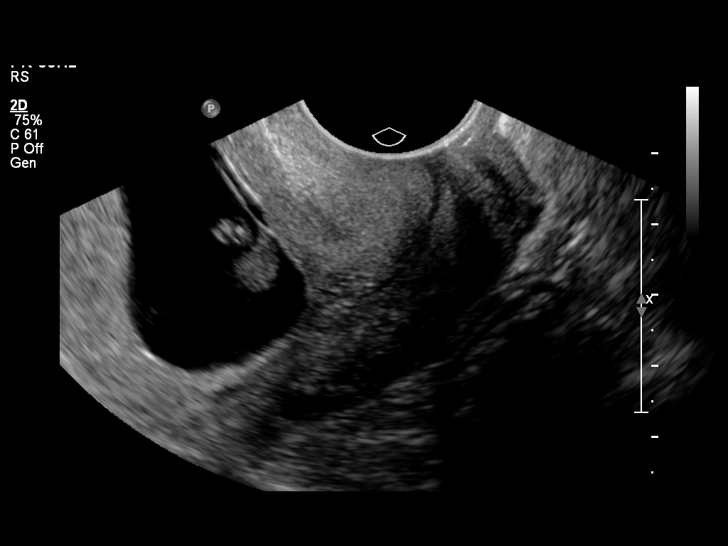
[im 6/54]
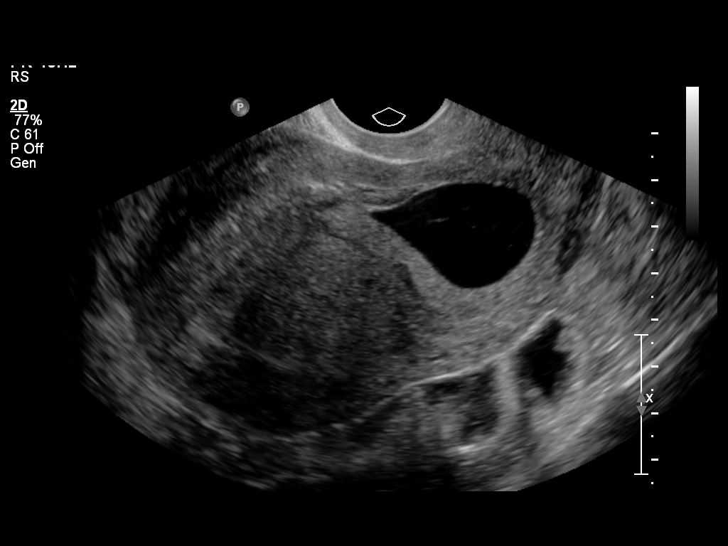
[im 10/54]
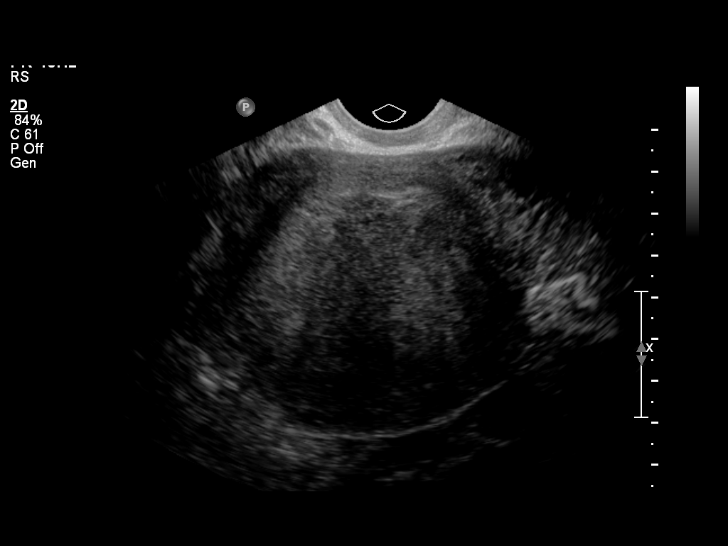
[im 14/54]
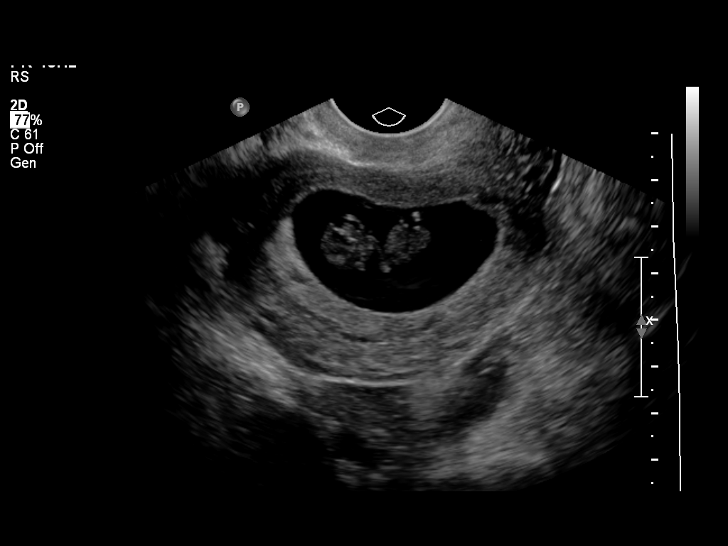
[im 18/54]
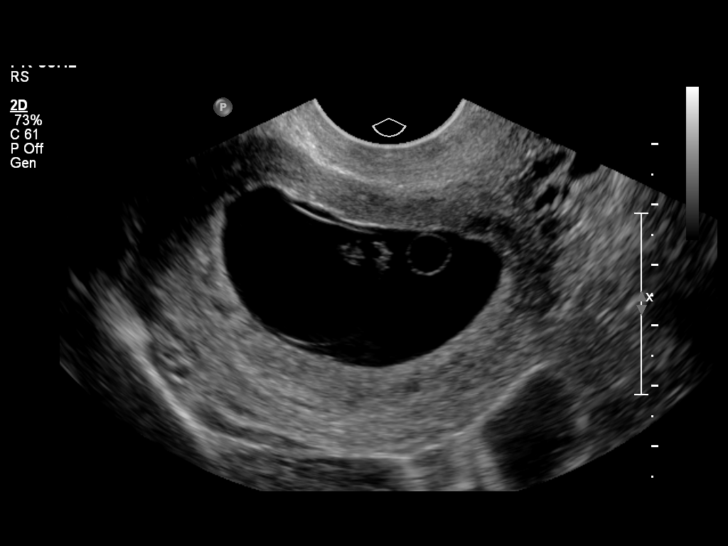
[im 22/54]
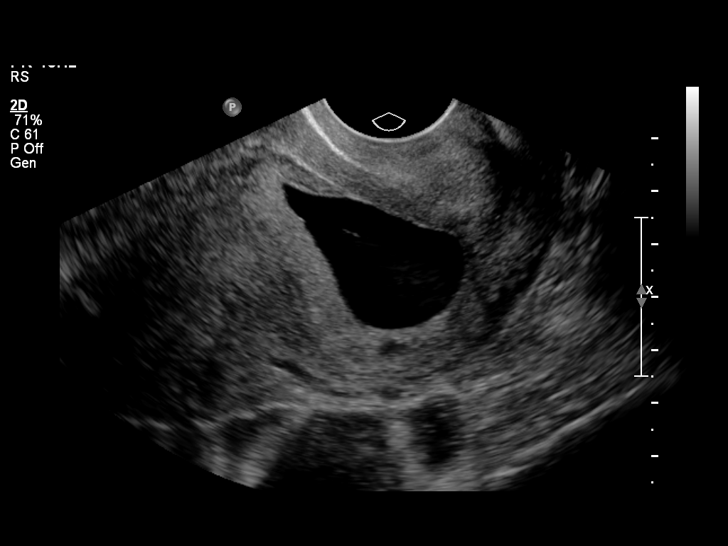
[im 26/54]
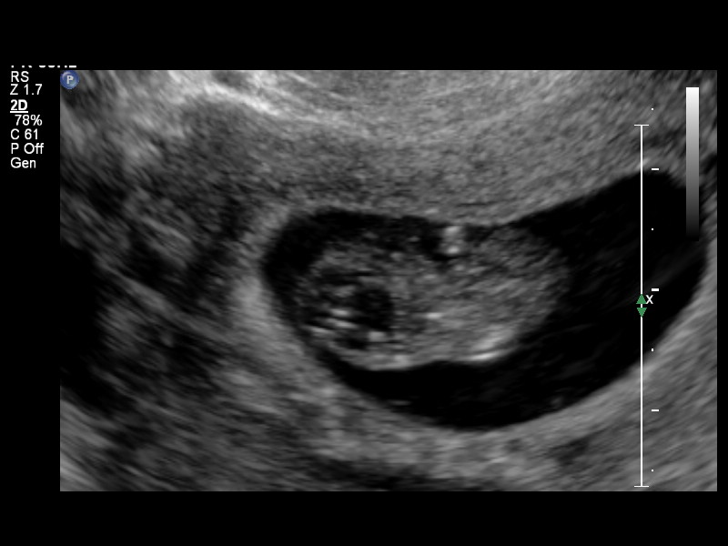
[im 30/54]
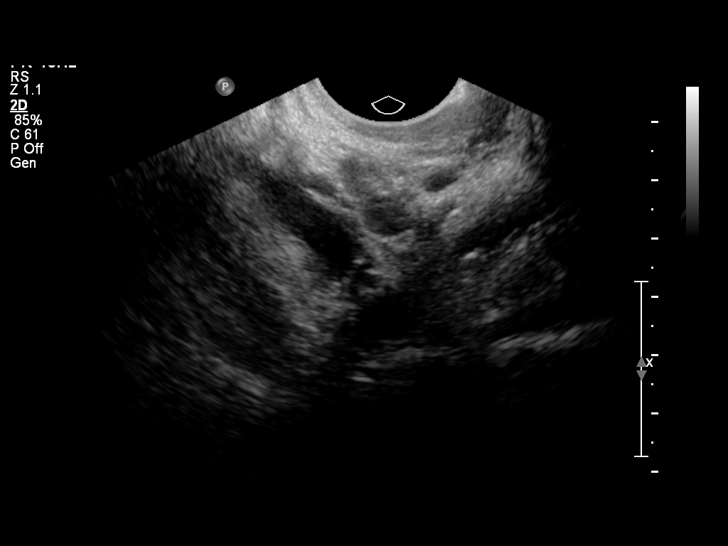
[im 34/54]
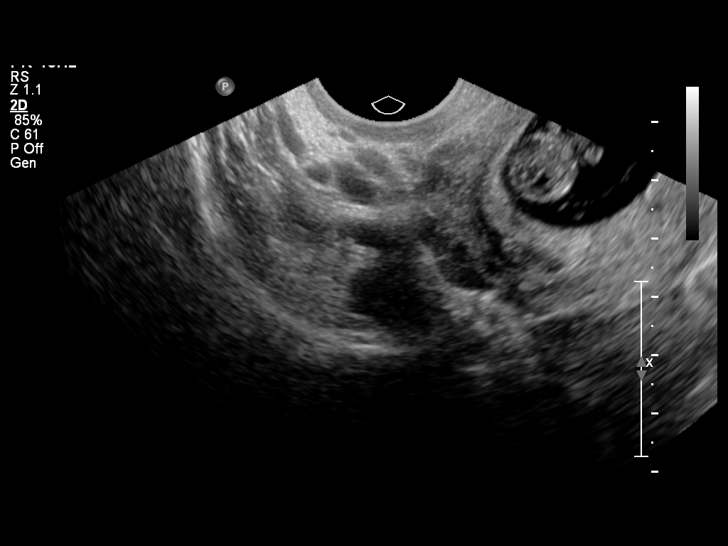
[im 38/54]
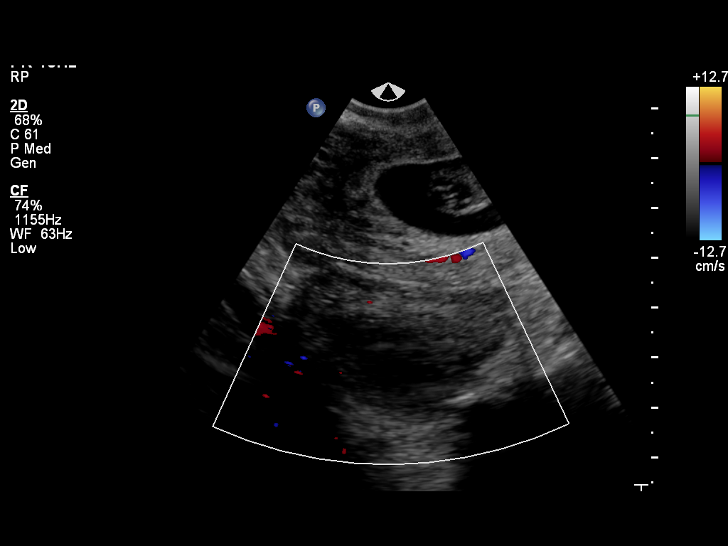
[im 42/54]
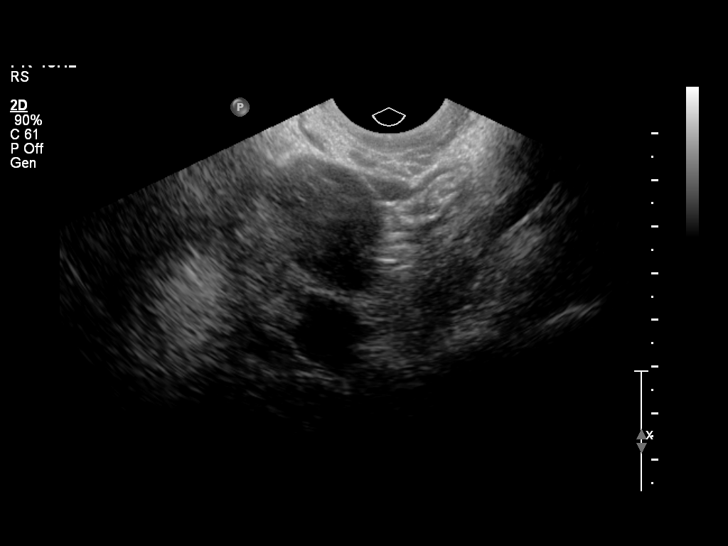
[im 46/54]
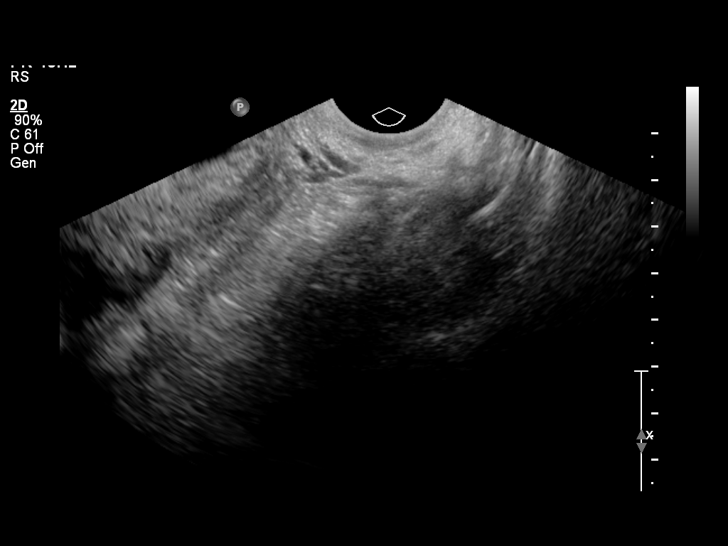
[im 50/54]
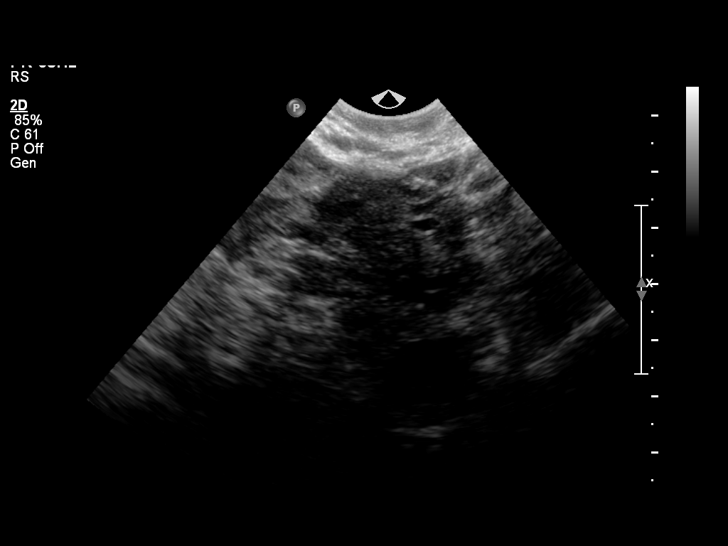
[im 54/54]
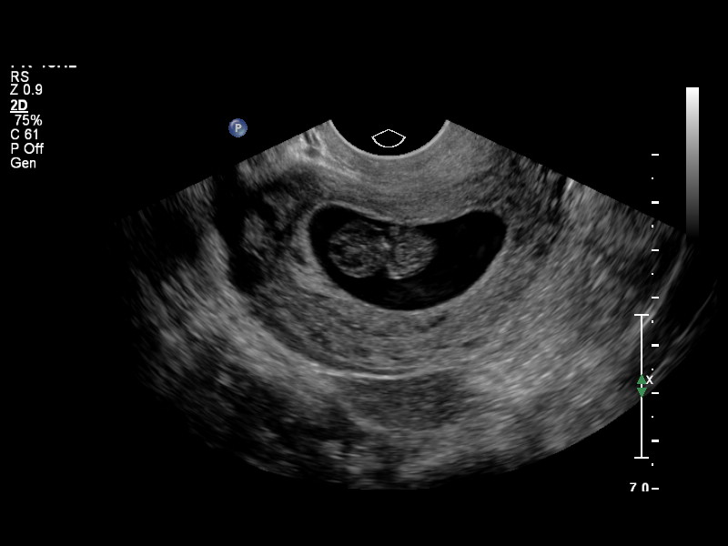

[14 of 28 positions shown; findings below may reference images not displayed]

FINDINGS: Intrauterine gestational sac: Visualized, slightly teardrop shaped,
located at lower uterine segment.

Yolk sac:  Present

Embryo:  Present

Cardiac Activity: Present

Heart Rate: 170 bpm

CRL:   25.6  mm   9 w 3 d                  US EDC: 02/21/2016

Maternal uterus/adnexae:

Ovaries normal size with a corpus luteal cyst within RIGHT ovary.

Minimal subchorionic hemorrhage adjacent to upper aspect of sac.

No free pelvic fluid or adnexal masses.
IMPRESSION: Single live intrauterine gestation identified.

Gestational sac however appears to be located at the lower uterine
segment, question slightly more inferiorly than on the previous
study.

Minimal subchorionic hemorrhage.

## 2016-09-25 ENCOUNTER — Encounter: Payer: Self-pay | Admitting: *Deleted

## 2016-09-25 ENCOUNTER — Ambulatory Visit (INDEPENDENT_AMBULATORY_CARE_PROVIDER_SITE_OTHER): Payer: Medicaid Other

## 2016-09-25 VITALS — BP 133/79 | HR 83 | Temp 97.9°F | Wt 243.4 lb

## 2016-09-25 DIAGNOSIS — Z3200 Encounter for pregnancy test, result unknown: Secondary | ICD-10-CM

## 2016-09-25 DIAGNOSIS — Z3201 Encounter for pregnancy test, result positive: Secondary | ICD-10-CM | POA: Diagnosis not present

## 2016-09-25 LAB — POCT URINE PREGNANCY: PREG TEST UR: POSITIVE — AB

## 2016-09-25 MED ORDER — PRENATAL PLUS 27-1 MG PO TABS: 1.0000 | ORAL_TABLET | Freq: Every day | ORAL | 0 refills | Status: DC

## 2016-09-25 NOTE — Progress Notes (Signed)
  Patient in office for upt, upt postive, lmp 08/03/16 Patient advised to rto in 3 weeks for nob visit. Patient states that she has had two preterm deliveries, c-section in January most recent, patient states she has had to have cerclage with both previous pregnancies along with 17p injections.

## 2016-09-25 NOTE — Addendum Note (Signed)
Addended by: Francene FindersJAMES, QUINETTA C on: 09/25/2016 01:10 PM   Modules accepted: Orders

## 2016-10-16 ENCOUNTER — Other Ambulatory Visit (HOSPITAL_COMMUNITY)
Admission: RE | Admit: 2016-10-16 | Discharge: 2016-10-16 | Disposition: A | Discharge status: Home / Self Care | Payer: Medicaid Other | Source: Ambulatory Visit | Attending: Obstetrics and Gynecology | Admitting: Obstetrics and Gynecology

## 2016-10-16 ENCOUNTER — Encounter: Payer: Self-pay | Admitting: Obstetrics and Gynecology

## 2016-10-16 ENCOUNTER — Ambulatory Visit (INDEPENDENT_AMBULATORY_CARE_PROVIDER_SITE_OTHER): Payer: Medicaid Other | Admitting: Obstetrics and Gynecology

## 2016-10-16 DIAGNOSIS — O09291 Supervision of pregnancy with other poor reproductive or obstetric history, first trimester: Secondary | ICD-10-CM

## 2016-10-16 DIAGNOSIS — O99210 Obesity complicating pregnancy, unspecified trimester: Secondary | ICD-10-CM | POA: Insufficient documentation

## 2016-10-16 DIAGNOSIS — Z113 Encounter for screening for infections with a predominantly sexual mode of transmission: Secondary | ICD-10-CM | POA: Insufficient documentation

## 2016-10-16 DIAGNOSIS — O09219 Supervision of pregnancy with history of pre-term labor, unspecified trimester: Secondary | ICD-10-CM

## 2016-10-16 DIAGNOSIS — O09899 Supervision of other high risk pregnancies, unspecified trimester: Secondary | ICD-10-CM | POA: Insufficient documentation

## 2016-10-16 DIAGNOSIS — Z3481 Encounter for supervision of other normal pregnancy, first trimester: Secondary | ICD-10-CM | POA: Diagnosis not present

## 2016-10-16 DIAGNOSIS — O09211 Supervision of pregnancy with history of pre-term labor, first trimester: Secondary | ICD-10-CM

## 2016-10-16 DIAGNOSIS — Z01419 Encounter for gynecological examination (general) (routine) without abnormal findings: Secondary | ICD-10-CM | POA: Diagnosis not present

## 2016-10-16 DIAGNOSIS — O34219 Maternal care for unspecified type scar from previous cesarean delivery: Secondary | ICD-10-CM

## 2016-10-16 DIAGNOSIS — O09299 Supervision of pregnancy with other poor reproductive or obstetric history, unspecified trimester: Secondary | ICD-10-CM

## 2016-10-16 DIAGNOSIS — O0990 Supervision of high risk pregnancy, unspecified, unspecified trimester: Secondary | ICD-10-CM

## 2016-10-16 DIAGNOSIS — O99211 Obesity complicating pregnancy, first trimester: Secondary | ICD-10-CM

## 2016-10-16 DIAGNOSIS — E669 Obesity, unspecified: Secondary | ICD-10-CM

## 2016-10-16 NOTE — Patient Instructions (Signed)
 First Trimester of Pregnancy The first trimester of pregnancy is from week 1 until the end of week 12 (months 1 through 3). A week after a sperm fertilizes an egg, the egg will implant on the wall of the uterus. This embryo will begin to develop into a baby. Genes from you and your partner are forming the baby. The female genes determine whether the baby is a boy or a girl. At 6-8 weeks, the eyes and face are formed, and the heartbeat can be seen on ultrasound. At the end of 12 weeks, all the baby's organs are formed.  Now that you are pregnant, you will want to do everything you can to have a healthy baby. Two of the most important things are to get good prenatal care and to follow your health care provider's instructions. Prenatal care is all the medical care you receive before the baby's birth. This care will help prevent, find, and treat any problems during the pregnancy and childbirth. BODY CHANGES Your body goes through many changes during pregnancy. The changes vary from woman to woman.   You may gain or lose a couple of pounds at first.  You may feel sick to your stomach (nauseous) and throw up (vomit). If the vomiting is uncontrollable, call your health care provider.  You may tire easily.  You may develop headaches that can be relieved by medicines approved by your health care provider.  You may urinate more often. Painful urination may mean you have a bladder infection.  You may develop heartburn as a result of your pregnancy.  You may develop constipation because certain hormones are causing the muscles that push waste through your intestines to slow down.  You may develop hemorrhoids or swollen, bulging veins (varicose veins).  Your breasts may begin to grow larger and become tender. Your nipples may stick out more, and the tissue that surrounds them (areola) may become darker.  Your gums may bleed and may be sensitive to brushing and flossing.  Dark spots or blotches  (chloasma, mask of pregnancy) may develop on your face. This will likely fade after the baby is born.  Your menstrual periods will stop.  You may have a loss of appetite.  You may develop cravings for certain kinds of food.  You may have changes in your emotions from day to day, such as being excited to be pregnant or being concerned that something may go wrong with the pregnancy and baby.  You may have more vivid and strange dreams.  You may have changes in your hair. These can include thickening of your hair, rapid growth, and changes in texture. Some women also have hair loss during or after pregnancy, or hair that feels dry or thin. Your hair will most likely return to normal after your baby is born. WHAT TO EXPECT AT YOUR PRENATAL VISITS During a routine prenatal visit:  You will be weighed to make sure you and the baby are growing normally.  Your blood pressure will be taken.  Your abdomen will be measured to track your baby's growth.  The fetal heartbeat will be listened to starting around week 10 or 12 of your pregnancy.  Test results from any previous visits will be discussed. Your health care provider may ask you:  How you are feeling.  If you are feeling the baby move.  If you have had any abnormal symptoms, such as leaking fluid, bleeding, severe headaches, or abdominal cramping.  If you are using any tobacco   products, including cigarettes, chewing tobacco, and electronic cigarettes.  If you have any questions. Other tests that may be performed during your first trimester include:  Blood tests to find your blood type and to check for the presence of any previous infections. They will also be used to check for low iron levels (anemia) and Rh antibodies. Later in the pregnancy, blood tests for diabetes will be done along with other tests if problems develop.  Urine tests to check for infections, diabetes, or protein in the urine.  An ultrasound to confirm the  proper growth and development of the baby.  An amniocentesis to check for possible genetic problems.  Fetal screens for spina bifida and Down syndrome.  You may need other tests to make sure you and the baby are doing well.  HIV (human immunodeficiency virus) testing. Routine prenatal testing includes screening for HIV, unless you choose not to have this test. HOME CARE INSTRUCTIONS  Medicines   Follow your health care provider's instructions regarding medicine use. Specific medicines may be either safe or unsafe to take during pregnancy.  Take your prenatal vitamins as directed.  If you develop constipation, try taking a stool softener if your health care provider approves. Diet   Eat regular, well-balanced meals. Choose a variety of foods, such as meat or vegetable-based protein, fish, milk and low-fat dairy products, vegetables, fruits, and whole grain breads and cereals. Your health care provider will help you determine the amount of weight gain that is right for you.  Avoid raw meat and uncooked cheese. These carry germs that can cause birth defects in the baby.  Eating four or five small meals rather than three large meals a day may help relieve nausea and vomiting. If you start to feel nauseous, eating a few soda crackers can be helpful. Drinking liquids between meals instead of during meals also seems to help nausea and vomiting.  If you develop constipation, eat more high-fiber foods, such as fresh vegetables or fruit and whole grains. Drink enough fluids to keep your urine clear or pale yellow. Activity and Exercise   Exercise only as directed by your health care provider. Exercising will help you:  Control your weight.  Stay in shape.  Be prepared for labor and delivery.  Experiencing pain or cramping in the lower abdomen or low back is a good sign that you should stop exercising. Check with your health care provider before continuing normal exercises.  Try to avoid  standing for long periods of time. Move your legs often if you must stand in one place for a long time.  Avoid heavy lifting.  Wear low-heeled shoes, and practice good posture.  You may continue to have sex unless your health care provider directs you otherwise. Relief of Pain or Discomfort   Wear a good support bra for breast tenderness.   Take warm sitz baths to soothe any pain or discomfort caused by hemorrhoids. Use hemorrhoid cream if your health care provider approves.   Rest with your legs elevated if you have leg cramps or low back pain.  If you develop varicose veins in your legs, wear support hose. Elevate your feet for 15 minutes, 3-4 times a day. Limit salt in your diet. Prenatal Care   Schedule your prenatal visits by the twelfth week of pregnancy. They are usually scheduled monthly at first, then more often in the last 2 months before delivery.  Write down your questions. Take them to your prenatal visits.  Keep all your   prenatal visits as directed by your health care provider. Safety   Wear your seat belt at all times when driving.  Make a list of emergency phone numbers, including numbers for family, friends, the hospital, and police and fire departments. General Tips   Ask your health care provider for a referral to a local prenatal education class. Begin classes no later than at the beginning of month 6 of your pregnancy.  Ask for help if you have counseling or nutritional needs during pregnancy. Your health care provider can offer advice or refer you to specialists for help with various needs.  Do not use hot tubs, steam rooms, or saunas.  Do not douche or use tampons or scented sanitary pads.  Do not cross your legs for long periods of time.  Avoid cat litter boxes and soil used by cats. These carry germs that can cause birth defects in the baby and possibly loss of the fetus by miscarriage or stillbirth.  Avoid all smoking, herbs, alcohol, and  medicines not prescribed by your health care provider. Chemicals in these affect the formation and growth of the baby.  Do not use any tobacco products, including cigarettes, chewing tobacco, and electronic cigarettes. If you need help quitting, ask your health care provider. You may receive counseling support and other resources to help you quit.  Schedule a dentist appointment. At home, brush your teeth with a soft toothbrush and be gentle when you floss. SEEK MEDICAL CARE IF:   You have dizziness.  You have mild pelvic cramps, pelvic pressure, or nagging pain in the abdominal area.  You have persistent nausea, vomiting, or diarrhea.  You have a bad smelling vaginal discharge.  You have pain with urination.  You notice increased swelling in your face, hands, legs, or ankles. SEEK IMMEDIATE MEDICAL CARE IF:   You have a fever.  You are leaking fluid from your vagina.  You have spotting or bleeding from your vagina.  You have severe abdominal cramping or pain.  You have rapid weight gain or loss.  You vomit blood or material that looks like coffee grounds.  You are exposed to German measles and have never had them.  You are exposed to fifth disease or chickenpox.  You develop a severe headache.  You have shortness of breath.  You have any kind of trauma, such as from a fall or a car accident. This information is not intended to replace advice given to you by your health care provider. Make sure you discuss any questions you have with your health care provider. Document Released: 11/07/2001 Document Revised: 12/04/2014 Document Reviewed: 09/23/2013 Elsevier Interactive Patient Education  2017 [employer]  Contraception Choices Contraception (birth control) is the use of any methods or devices to prevent pregnancy. Below are some methods to help avoid pregnancy. Hormonal methods  Contraceptive implant. This is a thin, plastic tube containing progesterone hormone. It  does not contain estrogen hormone. Your health care provider inserts the tube in the inner part of the upper arm. The tube can remain in place for up to 3 years. After 3 years, the implant must be removed. The implant prevents the ovaries from releasing an egg (ovulation), thickens the cervical mucus to prevent sperm from entering the uterus, and thins the lining of the inside of the uterus.  Progesterone-only injections. These injections are given every 3 months by your health care provider to prevent pregnancy. This synthetic progesterone hormone stops the ovaries from releasing eggs. It also thickens cervical   mucus and changes the uterine lining. This makes it harder for sperm to survive in the uterus.  Birth control pills. These pills contain estrogen and progesterone hormone. They work by preventing the ovaries from releasing eggs (ovulation). They also cause the cervical mucus to thicken, preventing the sperm from entering the uterus. Birth control pills are prescribed by a health care provider.Birth control pills can also be used to treat heavy periods.  Minipill. This type of birth control pill contains only the progesterone hormone. They are taken every day of each month and must be prescribed by your health care provider.  Birth control patch. The patch contains hormones similar to those in birth control pills. It must be changed once a week and is prescribed by a health care provider.  Vaginal ring. The ring contains hormones similar to those in birth control pills. It is left in the vagina for 3 weeks, removed for 1 week, and then a new one is put back in place. The patient must be comfortable inserting and removing the ring from the vagina.A health care provider's prescription is necessary.  Emergency contraception. Emergency contraceptives prevent pregnancy after unprotected sexual intercourse. This pill can be taken right after sex or up to 5 days after unprotected sex. It is most  effective the sooner you take the pills after having sexual intercourse. Most emergency contraceptive pills are available without a prescription. Check with your pharmacist. Do not use emergency contraception as your only form of birth control. Barrier methods  Female condom. This is a thin sheath (latex or rubber) that is worn over the penis during sexual intercourse. It can be used with spermicide to increase effectiveness.  Female condom. This is a soft, loose-fitting sheath that is put into the vagina before sexual intercourse.  Diaphragm. This is a soft, latex, dome-shaped barrier that must be fitted by a health care provider. It is inserted into the vagina, along with a spermicidal jelly. It is inserted before intercourse. The diaphragm should be left in the vagina for 6 to 8 hours after intercourse.  Cervical cap. This is a round, soft, latex or plastic cup that fits over the cervix and must be fitted by a health care provider. The cap can be left in place for up to 48 hours after intercourse.  Sponge. This is a soft, circular piece of polyurethane foam. The sponge has spermicide in it. It is inserted into the vagina after wetting it and before sexual intercourse.  Spermicides. These are chemicals that kill or block sperm from entering the cervix and uterus. They come in the form of creams, jellies, suppositories, foam, or tablets. They do not require a prescription. They are inserted into the vagina with an applicator before having sexual intercourse. The process must be repeated every time you have sexual intercourse. Intrauterine contraception  Intrauterine device (IUD). This is a T-shaped device that is put in a woman's uterus during a menstrual period to prevent pregnancy. There are 2 types:  Copper IUD. This type of IUD is wrapped in copper wire and is placed inside the uterus. Copper makes the uterus and fallopian tubes produce a fluid that kills sperm. It can stay in place for 10  years.  Hormone IUD. This type of IUD contains the hormone progestin (synthetic progesterone). The hormone thickens the cervical mucus and prevents sperm from entering the uterus, and it also thins the uterine lining to prevent implantation of a fertilized egg. The hormone can weaken or kill the sperm that   get into the uterus. It can stay in place for 3-5 years, depending on which type of IUD is used. Permanent methods of contraception  Female tubal ligation. This is when the woman's fallopian tubes are surgically sealed, tied, or blocked to prevent the egg from traveling to the uterus.  Hysteroscopic sterilization. This involves placing a small coil or insert into each fallopian tube. Your doctor uses a technique called hysteroscopy to do the procedure. The device causes scar tissue to form. This results in permanent blockage of the fallopian tubes, so the sperm cannot fertilize the egg. It takes about 3 months after the procedure for the tubes to become blocked. You must use another form of birth control for these 3 months.  Female sterilization. This is when the female has the tubes that carry sperm tied off (vasectomy).This blocks sperm from entering the vagina during sexual intercourse. After the procedure, the man can still ejaculate fluid (semen). Natural planning methods  Natural family planning. This is not having sexual intercourse or using a barrier method (condom, diaphragm, cervical cap) on days the woman could become pregnant.  Calendar method. This is keeping track of the length of each menstrual cycle and identifying when you are fertile.  Ovulation method. This is avoiding sexual intercourse during ovulation.  Symptothermal method. This is avoiding sexual intercourse during ovulation, using a thermometer and ovulation symptoms.  Post-ovulation method. This is timing sexual intercourse after you have ovulated. Regardless of which type or method of contraception you choose, it is  important that you use condoms to protect against the transmission of sexually transmitted infections (STIs). Talk with your health care provider about which form of contraception is most appropriate for you. This information is not intended to replace advice given to you by your health care provider. Make sure you discuss any questions you have with your health care provider. Document Released: 11/13/2005 Document Revised: 04/20/2016 Document Reviewed: 05/08/2013 Elsevier Interactive Patient Education  2017 [employer]  Postpartum Depression and Baby Blues The postpartum period begins right after the birth of a baby. During this time, there is often a great amount of joy and excitement. It is also a time of many changes in the life of the parents. Regardless of how many times a mother gives birth, each child brings new challenges and dynamics to the family. It is not unusual to have feelings of excitement along with confusing shifts in moods, emotions, and thoughts. All mothers are at risk of developing postpartum depression or the "baby blues." These mood changes can occur right after giving birth, or they may occur many months after giving birth. The baby blues or postpartum depression can be mild or severe. Additionally, postpartum depression can go away rather quickly, or it can be a long-term condition. What are the causes? Raised hormone levels and the rapid drop in those levels are thought to be a main cause of postpartum depression and the baby blues. A number of hormones change during and after pregnancy. Estrogen and progesterone usually decrease right after the delivery of your baby. The levels of thyroid hormone and various cortisol steroids also rapidly drop. Other factors that play a role in these mood changes include major life events and genetics. What increases the risk? If you have any of the following risks for the baby blues or postpartum depression, know what symptoms to watch out  for during the postpartum period. Risk factors that may increase the likelihood of getting the baby blues or postpartum depression include:  Having a personal or family history of depression.  Having depression while being pregnant.  Having premenstrual mood issues or mood issues related to oral contraceptives.  Having a lot of life stress.  Having marital conflict.  Lacking a social support network.  Having a baby with special needs.  Having health problems, such as diabetes. What are the signs or symptoms? Symptoms of baby blues include:  Brief changes in mood, such as going from extreme happiness to sadness.  Decreased concentration.  Difficulty sleeping.  Crying spells, tearfulness.  Irritability.  Anxiety. Symptoms of postpartum depression typically begin within the first month after giving birth. These symptoms include:  Difficulty sleeping or excessive sleepiness.  Marked weight loss.  Agitation.  Feelings of worthlessness.  Lack of interest in activity or food. Postpartum psychosis is a very serious condition and can be dangerous. Fortunately, it is rare. Displaying any of the following symptoms is cause for immediate medical attention. Symptoms of postpartum psychosis include:  Hallucinations and delusions.  Bizarre or disorganized behavior.  Confusion or disorientation. How is this diagnosed? A diagnosis is made by an evaluation of your symptoms. There are no medical or lab tests that lead to a diagnosis, but there are various questionnaires that a health care provider may use to identify those with the baby blues, postpartum depression, or psychosis. Often, a screening tool called the [place] Postnatal Depression Scale is used to diagnose depression in the postpartum period. How is this treated? The baby blues usually goes away on its own in 1-2 weeks. Social support is often all that is needed. You will be encouraged to get adequate sleep and rest.  Occasionally, you may be given medicines to help you sleep. Postpartum depression requires treatment because it can last several months or longer if it is not treated. Treatment may include individual or group therapy, medicine, or both to address any social, physiological, and psychological factors that may play a role in the depression. Regular exercise, a healthy diet, rest, and social support may also be strongly recommended. Postpartum psychosis is more serious and needs treatment right away. Hospitalization is often needed. Follow these instructions at home:  Get as much rest as you can. Nap when the baby sleeps.  Exercise regularly. Some women find yoga and walking to be beneficial.  Eat a balanced and nourishing diet.  Do little things that you enjoy. Have a cup of tea, take a bubble bath, read your favorite magazine, or listen to your favorite music.  Avoid alcohol.  Ask for help with household chores, cooking, grocery shopping, or running errands as needed. Do not try to do everything.  Talk to people close to you about how you are feeling. Get support from your partner, family members, friends, or other new moms.  Try to stay positive in how you think. Think about the things you are grateful for.  Do not spend a lot of time alone.  Only take over-the-counter or prescription medicine as directed by your health care provider.  Keep all your postpartum appointments.  Let your health care provider know if you have any concerns. Contact a health care provider if: You are having a reaction to or problems with your medicine. Get help right away if:  You have suicidal feelings.  You think you may harm the baby or someone else. This information is not intended to replace advice given to you by your health care provider. Make sure you discuss any questions you have with your health care provider.  Document Released: 08/17/2004 Document Revised: 04/20/2016 Document Reviewed:  08/25/2013 Elsevier Interactive Patient Education  2017 [employer]   Breastfeeding Deciding to breastfeed is one of the best choices you can make for you and your baby. A change in hormones during pregnancy causes your breast tissue to grow and increases the number and size of your milk ducts. These hormones also allow proteins, sugars, and fats from your blood supply to make breast milk in your milk-producing glands. Hormones prevent breast milk from being released before your baby is born as well as prompt milk flow after birth. Once breastfeeding has begun, thoughts of your baby, as well as his or her sucking or crying, can stimulate the release of milk from your milk-producing glands. Benefits of breastfeeding For Your Baby  Your first milk (colostrum) helps your baby's digestive system function better.  There are antibodies in your milk that help your baby fight off infections.  Your baby has a lower incidence of asthma, allergies, and sudden infant death syndrome.  The nutrients in breast milk are better for your baby than infant formulas and are designed uniquely for your baby's needs.  Breast milk improves your baby's brain development.  Your baby is less likely to develop other conditions, such as childhood obesity, asthma, or type 2 diabetes mellitus. For You  Breastfeeding helps to create a very special bond between you and your baby.  Breastfeeding is convenient. Breast milk is always available at the correct temperature and costs nothing.  Breastfeeding helps to burn calories and helps you lose the weight gained during pregnancy.  Breastfeeding makes your uterus contract to its prepregnancy size faster and slows bleeding (lochia) after you give birth.  Breastfeeding helps to lower your risk of developing type 2 diabetes mellitus, osteoporosis, and breast or ovarian cancer later in life. Signs that your baby is hungry Early Signs of Hunger  Increased alertness or  activity.  Stretching.  Movement of the head from side to side.  Movement of the head and opening of the mouth when the corner of the mouth or cheek is stroked (rooting).  Increased sucking sounds, smacking lips, cooing, sighing, or squeaking.  Hand-to-mouth movements.  Increased sucking of fingers or hands. Late Signs of Hunger  Fussing.  Intermittent crying. Extreme Signs of Hunger  Signs of extreme hunger will require calming and consoling before your baby will be able to breastfeed successfully. Do not wait for the following signs of extreme hunger to occur before you initiate breastfeeding:  Restlessness.  A loud, strong cry.  Screaming. Breastfeeding basics  Breastfeeding Initiation  Find a comfortable place to sit or lie down, with your neck and back well supported.  Place a pillow or rolled up blanket under your baby to bring him or her to the level of your breast (if you are seated). Nursing pillows are specially designed to help support your arms and your baby while you breastfeed.  Make sure that your baby's abdomen is facing your abdomen.  Gently massage your breast. With your fingertips, massage from your chest wall toward your nipple in a circular motion. This encourages milk flow. You may need to continue this action during the feeding if your milk flows slowly.  Support your breast with 4 fingers underneath and your thumb above your nipple. Make sure your fingers are well away from your nipple and your baby's mouth.  Stroke your baby's lips gently with your finger or nipple.  When your baby's mouth is open wide enough, quickly  bring your baby to your breast, placing your entire nipple and as much of the colored area around your nipple (areola) as possible into your baby's mouth.  More areola should be visible above your baby's upper lip than below the lower lip.  Your baby's tongue should be between his or her lower gum and your breast.  Ensure that your  baby's mouth is correctly positioned around your nipple (latched). Your baby's lips should create a seal on your breast and be turned out (everted).  It is common for your baby to suck about 2-3 minutes in order to start the flow of breast milk. Latching  Teaching your baby how to latch on to your breast properly is very important. An improper latch can cause nipple pain and decreased milk supply for you and poor weight gain in your baby. Also, if your baby is not latched onto your nipple properly, he or she may swallow some air during feeding. This can make your baby fussy. Burping your baby when you switch breasts during the feeding can help to get rid of the air. However, teaching your baby to latch on properly is still the best way to prevent fussiness from swallowing air while breastfeeding. Signs that your baby has successfully latched on to your nipple:  Silent tugging or silent sucking, without causing you pain.  Swallowing heard between every 3-4 sucks.  Muscle movement above and in front of his or her ears while sucking. Signs that your baby has not successfully latched on to nipple:  Sucking sounds or smacking sounds from your baby while breastfeeding.  Nipple pain. If you think your baby has not latched on correctly, slip your finger into the corner of your baby's mouth to break the suction and place it between your baby's gums. Attempt breastfeeding initiation again. Signs of Successful Breastfeeding  Signs from your baby:  A gradual decrease in the number of sucks or complete cessation of sucking.  Falling asleep.  Relaxation of his or her body.  Retention of a small amount of milk in his or her mouth.  Letting go of your breast by himself or herself. Signs from you:  Breasts that have increased in firmness, weight, and size 1-3 hours after feeding.  Breasts that are softer immediately after breastfeeding.  Increased milk volume, as well as a change in milk  consistency and color by the fifth day of breastfeeding.  Nipples that are not sore, cracked, or bleeding. Signs That Your Pecola LeisureBaby is Getting Enough Milk  Wetting at least 1-2 diapers during the first 24 hours after birth.  Wetting at least 5-6 diapers every 24 hours for the first week after birth. The urine should be clear or pale yellow by 5 days after birth.  Wetting 6-8 diapers every 24 hours as your baby continues to grow and develop.  At least 3 stools in a 24-hour period by age 9 days. The stool should be soft and yellow.  At least 3 stools in a 24-hour period by age 762 days. The stool should be seedy and yellow.  No loss of weight greater than 10% of birth weight during the first 343 days of age.  Average weight gain of 4-7 ounces (113-198 g) per week after age 76 days.  Consistent daily weight gain by age 9 days, without weight loss after the age of 2 weeks. After a feeding, your baby may spit up a small amount. This is common. Breastfeeding frequency and duration Frequent feeding will help you  make more milk and can prevent sore nipples and breast engorgement. Breastfeed when you feel the need to reduce the fullness of your breasts or when your baby shows signs of hunger. This is called "breastfeeding on demand." Avoid introducing a pacifier to your baby while you are working to establish breastfeeding (the first 4-6 weeks after your baby is born). After this time you may choose to use a pacifier. Research has shown that pacifier use during the first year of a baby's life decreases the risk of sudden infant death syndrome (SIDS). Allow your baby to feed on each breast as long as he or she wants. Breastfeed until your baby is finished feeding. When your baby unlatches or falls asleep while feeding from the first breast, offer the second breast. Because newborns are often sleepy in the first few weeks of life, you may need to awaken your baby to get him or her to feed. Breastfeeding times  will vary from baby to baby. However, the following rules can serve as a guide to help you ensure that your baby is properly fed:  Newborns (babies 59 weeks of age or younger) may breastfeed every 1-3 hours.  Newborns should not go longer than 3 hours during the day or 5 hours during the night without breastfeeding.  You should breastfeed your baby a minimum of 8 times in a 24-hour period until you begin to introduce solid foods to your baby at around 53 months of age. Breast milk pumping Pumping and storing breast milk allows you to ensure that your baby is exclusively fed your breast milk, even at times when you are unable to breastfeed. This is especially important if you are going back to work while you are still breastfeeding or when you are not able to be present during feedings. Your lactation consultant can give you guidelines on how long it is safe to store breast milk. A breast pump is a machine that allows you to pump milk from your breast into a sterile bottle. The pumped breast milk can then be stored in a refrigerator or freezer. Some breast pumps are operated by hand, while others use electricity. Ask your lactation consultant which type will work best for you. Breast pumps can be purchased, but some hospitals and breastfeeding support groups lease breast pumps on a monthly basis. A lactation consultant can teach you how to hand express breast milk, if you prefer not to use a pump. Caring for your breasts while you breastfeed Nipples can become dry, cracked, and sore while breastfeeding. The following recommendations can help keep your breasts moisturized and healthy:  Avoid using soap on your nipples.  Wear a supportive bra. Although not required, special nursing bras and tank tops are designed to allow access to your breasts for breastfeeding without taking off your entire bra or top. Avoid wearing underwire-style bras or extremely tight bras.  Air dry your nipples for 3-58minutes  after each feeding.  Use only cotton bra pads to absorb leaked breast milk. Leaking of breast milk between feedings is normal.  Use lanolin on your nipples after breastfeeding. Lanolin helps to maintain your skin's normal moisture barrier. If you use pure lanolin, you do not need to wash it off before feeding your baby again. Pure lanolin is not toxic to your baby. You may also hand express a few drops of breast milk and gently massage that milk into your nipples and allow the milk to air dry. In the first few weeks after giving birth,  some women experience extremely full breasts (engorgement). Engorgement can make your breasts feel heavy, warm, and tender to the touch. Engorgement peaks within 3-5 days after you give birth. The following recommendations can help ease engorgement:  Completely empty your breasts while breastfeeding or pumping. You may want to start by applying warm, moist heat (in the shower or with warm water-soaked hand towels) just before feeding or pumping. This increases circulation and helps the milk flow. If your baby does not completely empty your breasts while breastfeeding, pump any extra milk after he or she is finished.  Wear a snug bra (nursing or regular) or tank top for 1-2 days to signal your body to slightly decrease milk production.  Apply ice packs to your breasts, unless this is too uncomfortable for you.  Make sure that your baby is latched on and positioned properly while breastfeeding. If engorgement persists after 48 hours of following these recommendations, contact your health care provider or a Advertising copywriter. Overall health care recommendations while breastfeeding  Eat healthy foods. Alternate between meals and snacks, eating 3 of each per day. Because what you eat affects your breast milk, some of the foods may make your baby more irritable than usual. Avoid eating these foods if you are sure that they are negatively affecting your baby.  Drink  milk, fruit juice, and water to satisfy your thirst (about 10 glasses a day).  Rest often, relax, and continue to take your prenatal vitamins to prevent fatigue, stress, and anemia.  Continue breast self-awareness checks.  Avoid chewing and smoking tobacco. Chemicals from cigarettes that pass into breast milk and exposure to secondhand smoke may harm your baby.  Avoid alcohol and drug use, including marijuana. Some medicines that may be harmful to your baby can pass through breast milk. It is important to ask your health care provider before taking any medicine, including all over-the-counter and prescription medicine as well as vitamin and herbal supplements. It is possible to become pregnant while breastfeeding. If birth control is desired, ask your health care provider about options that will be safe for your baby. Contact a health care provider if:  You feel like you want to stop breastfeeding or have become frustrated with breastfeeding.  You have painful breasts or nipples.  Your nipples are cracked or bleeding.  Your breasts are red, tender, or warm.  You have a swollen area on either breast.  You have a fever or chills.  You have nausea or vomiting.  You have drainage other than breast milk from your nipples.  Your breasts do not become full before feedings by the fifth day after you give birth.  You feel sad and depressed.  Your baby is too sleepy to eat well.  Your baby is having trouble sleeping.  Your baby is wetting less than 3 diapers in a 24-hour period.  Your baby has less than 3 stools in a 24-hour period.  Your baby's skin or the white part of his or her eyes becomes yellow.  Your baby is not gaining weight by 29 days of age. Get help right away if:  Your baby is overly tired (lethargic) and does not want to wake up and feed.  Your baby develops an unexplained fever. This information is not intended to replace advice given to you by your health care  provider. Make sure you discuss any questions you have with your health care provider. Document Released: 11/13/2005 Document Revised: 04/26/2016 Document Reviewed: 05/07/2013 Elsevier Interactive Patient Education  2017 [employer] Inc[employer]  Trial of Labor After Cesarean Delivery Information A trial of labor after cesarean delivery (TOLAC) is when a woman tries to give birth vaginally after a previous cesarean delivery. TOLAC may be a safe and appropriate option for you depending on your medical history and other risk factors. When TOLAC is successful and you are able to have a vaginal delivery, this is called a vaginal birth after cesarean delivery (VBAC).  CANDIDATES FOR TOLAC TOLAC is possible for some women who:  Have undergone one or two prior cesarean deliveries in which the incision of the uterus was horizontal (low transverse).  Are carrying twins and have had one prior low transverse incision during a cesarean delivery.  Do not have a vertical (classical) uterine scar.  Have not had a tear in the wall of their uterus (uterine rupture). TOLAC is also supported for women who meet appropriate criteria and:  Are under the age of 40 years.  Are tall and have a body mass index (BMI) of less than 30.  Have an unknown uterine scar.  Give birth in a facility equipped to handle an emergency cesarean delivery. This team should be able to handle possible complications such as a uterine rupture.  Have thorough counseling about the benefits and risks of TOLAC.  Have discussed future pregnancy plans with their health care provider.  Plan to have several more pregnancies. MOST SUCCESSFUL CANDIDATES FOR TOLAC:  Have had a successful vaginal delivery before or after their cesarean delivery.  Experience labor that begins naturally on or before the due date (40 weeks of gestation).  Do not have a very large (macrosomic) baby.   Had a prior cesarean delivery but are not currently experiencing  factors that would prompt a cesarean delivery (such as a breech position).  Had only one prior cesarean delivery.  Had a prior cesarean delivery that was performed early in labor and not after full cervical dilation. TOLAC may be most appropriate for women who meet the above guidelines and who plan to have more pregnancies. TOLAC is not recommended for home births. LEAST SUCCESSFUL CANDIDATES FOR TOLAC:  Have an induced labor with an unfavorable cervix. An unfavorable cervix is when the cervix is not dilating enough (among other factors).  Have never had a vaginal delivery.  Have had more than two cesarean deliveries.  Have a pregnancy at more than 40 weeks of gestation.  Are pregnant with a baby with a suspected weight greater than 4,000 grams (8 pounds) and who have no prior history of a vaginal delivery.  Have closely spaced pregnancies. SUGGESTED BENEFITS OF TOLAC  You may have a faster recovery time.  You may have a shorter stay in the hospital.  You may have less pain and fewer problems than with a cesarean delivery. Women who have a cesarean delivery have a higher chance of needing blood or getting a fever, an infection, or a blood clot in the legs. SUGGESTED RISKS OF TOLAC The highest risk of complications happens to women who attempt a TOLAC and fail. A failed TOLAC results in an unplanned cesarean delivery. Risks related to [facility] or repeat cesarean deliveries include:   Blood loss.  Infection.  Blood clot.  Injury to surrounding tissues or organs.  Having to remove the uterus (hysterectomy).  Potential problems with the placenta (such as placenta previa or placenta accreta) in future pregnancies. Although very rare, the main concerns with TOLAC are:  Rupture of the uterine scar from a past cesarean  delivery.  Needing an emergency cesarean delivery.  Having a bad outcome for the baby (perinatal morbidity). FOR MORE INFORMATION American Congress of  Obstetricians and Gynecologists: www.acog.org [employer] Nurse-Midwives: www.midwife.org This information is not intended to replace advice given to you by your health care provider. Make sure you discuss any questions you have with your health care provider. Document Released: 08/01/2011 Document Revised: 09/03/2013 Document Reviewed: 05/05/2013 Elsevier Interactive Patient Education  2017 [employer].   Preterm Labor and Birth Information The normal length of a pregnancy is 39-41 weeks. Preterm labor is when labor starts before 37 completed weeks of pregnancy. What are the risk factors for preterm labor? Preterm labor is more likely to occur in women who:  Have certain infections during pregnancy such as a bladder infection, sexually transmitted infection, or infection inside the uterus (chorioamnionitis).  Have a shorter-than-normal cervix.  Have gone into preterm labor before.  Have had surgery on their cervix.  Are younger than age 70 or older than age 22.  Are African American.  Are pregnant with twins or multiple babies (multiple gestation).  Take street drugs or smoke while pregnant.  Do not gain enough weight while pregnant.  Became pregnant shortly after having been pregnant. What are the symptoms of preterm labor? Symptoms of preterm labor include:  Cramps similar to those that can happen during a menstrual period. The cramps may happen with diarrhea.  Pain in the abdomen or lower back.  Regular uterine contractions that may feel like tightening of the abdomen.  A feeling of increased pressure in the pelvis.  Increased watery or bloody mucus discharge from the vagina.  Water breaking (ruptured amniotic sac). Why is it important to recognize signs of preterm labor? It is important to recognize signs of preterm labor because babies who are born prematurely may not be fully developed. This can put them at an increased risk for:  Long-term (chronic)  heart and lung problems.  Difficulty immediately after birth with regulating body systems, including blood sugar, body temperature, heart rate, and breathing rate.  Bleeding in the brain.  Cerebral palsy.  Learning difficulties.  Death. These risks are highest for babies who are born before 34 weeks of pregnancy. How is preterm labor treated? Treatment depends on the length of your pregnancy, your condition, and the health of your baby. It may involve:  Having a stitch (suture) placed in your cervix to prevent your cervix from opening too early (cerclage).  Taking or being given medicines, such as:  Hormone medicines. These may be given early in pregnancy to help support the pregnancy.  Medicine to stop contractions.  Medicines to help mature the baby's lungs. These may be prescribed if the risk of delivery is high.  Medicines to prevent your baby from developing cerebral palsy. If the labor happens before 34 weeks of pregnancy, you may need to stay in the hospital. What should I do if I think I am in preterm labor? If you think that you are going into preterm labor, call your health care provider right away. How can I prevent preterm labor in future pregnancies? To increase your chance of having a full-term pregnancy:  Do not use any tobacco products, such as cigarettes, chewing tobacco, and e-cigarettes. If you need help quitting, ask your health care provider.  Do not use street drugs or medicines that have not been prescribed to you during your pregnancy.  Talk with your health care provider before taking any herbal supplements, even if  you have been taking them regularly.  Make sure you gain a healthy amount of weight during your pregnancy.  Watch for infection. If you think that you might have an infection, get it checked right away.  Make sure to tell your health care provider if you have gone into preterm labor before. This information is not intended to replace  advice given to you by your health care provider. Make sure you discuss any questions you have with your health care provider. Document Released: 02/03/2004 Document Revised: 04/25/2016 Document Reviewed: 04/05/2016 Elsevier Interactive Patient Education  2017 [employer].

## 2016-10-16 NOTE — Progress Notes (Signed)
Subjective:    Sarah Sutton is a [id number] 5441w4d being seen today for her first obstetrical visit.  Her obstetrical history is significant for h/o incompetent cervix, history of preterm delivery, history of cesarean section with last pregnancy and short interval between pregnancy with last delivery on 12/15/2015. Patient does intend to breast feed. Pregnancy history fully reviewed.  Patient reports no complaints. She is very anxious about starting another high risk pregnancy with a new provider  Vitals:   10/16/16 1407  BP: 124/79  Pulse: 77  Temp: 98.6 F (37 C)  Weight: 239 lb (108.4 kg)    HISTORY: OB History  Gravida Para Term Preterm AB Living  3 2 0 2 0 2  SAB TAB Ectopic Multiple Live Births  0 0 0 0 2    # Outcome Date GA Lbr Len/2nd Weight Sex Delivery Anes PTL Lv  3 Current           2 Preterm 12/15/15 1967w4d  3 lb 0.7 oz (1.38 kg) M CS-LTranv Spinal  LIV  1 Preterm 09/30/12 449w0d / 00:22 4 lb 11.2 oz (2.132 kg) F Vag-Spont None  LIV     Past Medical History:  Diagnosis Date  . Abnormal Pap smear 2007   COLPO DONE;LAST PAP 03/2011 WAS NORMAL  . Anemia    NO MEDS IN PAST  . Heart murmur    AS A CHILD, never caused any problems  . Infection    UTI;NOT FREQ  . Infection    YEAST INF;NOT FREQ  . Infection    BV X 1;CURRENTLY TAKING FLAGYL FOR EX  . NSVD (normal spontaneous vaginal delivery) 09/30/2012   Preterm delivery at 34 weeks; IOL for PPROM  . Polycystic ovarian syndrome 2009   by Dr. Gaynell FaceMarshall  . Preterm labor   . Vaginal Pap smear, abnormal    Past Surgical History:  Procedure Laterality Date  . CERVICAL CERCLAGE  07/01/2012   Procedure: CERCLAGE CERVICAL;  Surgeon: Esmeralda ArthurSandra A Rivard, MD;  Location: [facility];  Service: Gynecology;  Laterality: N/A;  . CERVICAL CERCLAGE N/A 09/01/2015   Procedure: CERCLAGE CERVICAL;  Surgeon: Jaymes GraffNaima Dillard, MD;  Location: [facility];  Service: Gynecology;  Laterality: N/A;  . CERVICAL CERCLAGE N/A 12/15/2015   Procedure: CERCLAGE  CERVICAL removal;  Surgeon: Jaymes GraffNaima Dillard, MD;  Location: [facility];  Service: Obstetrics;  Laterality: N/A;  . CESAREAN SECTION N/A 12/15/2015   Procedure: CESAREAN SECTION;  Surgeon: Jaymes GraffNaima Dillard, MD;  Location: [facility];  Service: Obstetrics;  Laterality: N/A;  . CHOLECYSTECTOMY  november 2009  . COLPOSCOPY  2009  . WISDOM TOOTH EXTRACTION     ALL 4 REMOVED   Family History  Problem Relation Age of Onset  . Asthma Mother   . Diabetes Father   . Hypertension Father   . Cancer Maternal Grandmother     bone cancer  . Cancer Paternal Grandmother     breast cancer     Exam    Uterus:   8-weeks  Pelvic Exam:    Perineum: No Hemorrhoids, Normal Perineum   Vulva: normal   Vagina:  normal mucosa, normal discharge   pH:    Cervix: multiparous appearance and closed and long   Adnexa: normal adnexa and no mass, fullness, tenderness   Bony Pelvis: gynecoid  System: Breast:  normal appearance, no masses or tenderness   Skin: normal coloration and turgor, no rashes    Neurologic: oriented, no focal deficits   Extremities: normal strength, tone, and muscle  mass   HEENT extra ocular movement intact   Mouth/Teeth mucous membranes moist, pharynx normal without lesions and dental hygiene good   Neck supple and no masses   Cardiovascular: regular rate and rhythm   Respiratory:  chest clear, no wheezing, crepitations, rhonchi, normal symmetric air entry   Abdomen: soft, non-tender; bowel sounds normal; no masses,  no organomegaly, obese   Urinary:       Assessment:    Pregnancy: [id number] Patient Active Problem List   Diagnosis Date Noted  . History of incompetent cervix, currently pregnant 10/16/2016  . History of preterm delivery, currently pregnant 10/16/2016  . Short interval between pregnancies affecting pregnancy, antepartum 10/16/2016  . Previous cesarean section complicating pregnancy 10/16/2016  . Supervision of high risk pregnancy, antepartum 10/16/2016        Plan:      Initial labs drawn. Prenatal vitamins. Problem list reviewed and updated. Genetic Screening discussed : declined.  Ultrasound discussed; fetal survey: requested. Patient with history of incompetent cervix- discussed cerclage placement again with this pregnancy- Patient agrees to be scheduled for it Patient with history of preterm labor- discussed benefits of 17-P and patient agrees Patient with previous cesarean section- information on TOLAC provided for review. This topic will be discussed again at a later visit Patient already received flu vaccine Patient appears to be less than 10 weeks on ultrasound. Yolk sac, fetal pole without cardiac activity clearly seen. Patient sent for viability ultrasound  Follow up in 4 weeks. 50% of 30 min visit spent on counseling and coordination of care.     Janett Kamath 10/16/2016

## 2016-10-16 NOTE — Progress Notes (Signed)
Patient states that she feels good today .Pt declined babyscripts app

## 2016-10-16 NOTE — Addendum Note (Signed)
Addended by: Maretta BeesMCGLASHAN, Ndea Kilroy J on: 10/16/2016 03:20 PM   Modules accepted: Orders

## 2016-10-16 NOTE — Progress Notes (Signed)
Informed pt to leave urine sample, pt left without urinating.

## 2016-10-17 LAB — GC/CHLAMYDIA PROBE AMP (~~LOC~~) NOT AT ARMC
Chlamydia: NEGATIVE
NEISSERIA GONORRHEA: NEGATIVE

## 2016-10-18 ENCOUNTER — Inpatient Hospital Stay (HOSPITAL_COMMUNITY)
Admission: AD | Admit: 2016-10-18 | Discharge: 2016-10-18 | Disposition: A | Discharge status: Home / Self Care | Payer: Medicaid Other | Source: Ambulatory Visit | Attending: Obstetrics & Gynecology | Admitting: Obstetrics & Gynecology

## 2016-10-18 ENCOUNTER — Encounter (HOSPITAL_COMMUNITY): Payer: Self-pay | Admitting: *Deleted

## 2016-10-18 DIAGNOSIS — Z803 Family history of malignant neoplasm of breast: Secondary | ICD-10-CM | POA: Insufficient documentation

## 2016-10-18 DIAGNOSIS — Z8249 Family history of ischemic heart disease and other diseases of the circulatory system: Secondary | ICD-10-CM | POA: Diagnosis not present

## 2016-10-18 DIAGNOSIS — O0001 Abdominal pregnancy with intrauterine pregnancy: Secondary | ICD-10-CM

## 2016-10-18 DIAGNOSIS — Z9889 Other specified postprocedural states: Secondary | ICD-10-CM | POA: Insufficient documentation

## 2016-10-18 DIAGNOSIS — Z79899 Other long term (current) drug therapy: Secondary | ICD-10-CM | POA: Insufficient documentation

## 2016-10-18 DIAGNOSIS — O0990 Supervision of high risk pregnancy, unspecified, unspecified trimester: Secondary | ICD-10-CM

## 2016-10-18 DIAGNOSIS — O09219 Supervision of pregnancy with history of pre-term labor, unspecified trimester: Secondary | ICD-10-CM

## 2016-10-18 DIAGNOSIS — Z833 Family history of diabetes mellitus: Secondary | ICD-10-CM | POA: Diagnosis not present

## 2016-10-18 DIAGNOSIS — O34219 Maternal care for unspecified type scar from previous cesarean delivery: Secondary | ICD-10-CM

## 2016-10-18 DIAGNOSIS — O99281 Endocrine, nutritional and metabolic diseases complicating pregnancy, first trimester: Secondary | ICD-10-CM | POA: Diagnosis present

## 2016-10-18 DIAGNOSIS — Z825 Family history of asthma and other chronic lower respiratory diseases: Secondary | ICD-10-CM | POA: Insufficient documentation

## 2016-10-18 DIAGNOSIS — Z87891 Personal history of nicotine dependence: Secondary | ICD-10-CM | POA: Diagnosis not present

## 2016-10-18 DIAGNOSIS — Z3A10 10 weeks gestation of pregnancy: Secondary | ICD-10-CM | POA: Insufficient documentation

## 2016-10-18 DIAGNOSIS — O09299 Supervision of pregnancy with other poor reproductive or obstetric history, unspecified trimester: Secondary | ICD-10-CM

## 2016-10-18 DIAGNOSIS — Z808 Family history of malignant neoplasm of other organs or systems: Secondary | ICD-10-CM | POA: Insufficient documentation

## 2016-10-18 DIAGNOSIS — Z9049 Acquired absence of other specified parts of digestive tract: Secondary | ICD-10-CM | POA: Insufficient documentation

## 2016-10-18 DIAGNOSIS — O09899 Supervision of other high risk pregnancies, unspecified trimester: Secondary | ICD-10-CM

## 2016-10-18 LAB — CYTOLOGY - PAP: Diagnosis: NEGATIVE

## 2016-10-18 NOTE — Discharge Instructions (Signed)

## 2016-10-18 NOTE — MAU Note (Signed)
Pt reports she had her first appointment yesterday and that she is supposed to be about 10 weeks, and the u/s in the clinic showed about 5 weeks and she is feeling uneasy about the difference and whether or not the baby may be dead.

## 2016-10-18 NOTE — MAU Provider Note (Signed)
History     CSN: [id number]  Arrival date and time: 10/18/16 0034  No chief complaint on file.  Patient is a 25 year old G3 P2 at 10 weeks and 6 days by LMP who was seen by Dr. Jolayne Panther at from evening yesterday. She presents today during her shift at work with concerns that she has had "dead baby inside of her" she has these concerns because the ultrasound that Dr. Jolayne Panther performed yesterday revealed a likely 5-6 week pregnancy not a 10 week pregnancy. She has been working throughout her shift with no problems. She denies any vaginal bleeding or abdominal pain. She denies fevers and chills. She wants a D&C scheduled immediately this is a miscarriage and she wants an ultrasound tonight to determine if this is a miscarriage.    OB History    Gravida Para Term Preterm AB Living   3 2 0 2 0 2   SAB TAB Ectopic Multiple Live Births   0 0 0 0 2      Past Medical History:  Diagnosis Date  . Abnormal Pap smear 2007   COLPO DONE;LAST PAP 03/2011 WAS NORMAL  . Anemia    NO MEDS IN PAST  . Heart murmur    AS A CHILD, never caused any problems  . Infection    UTI;NOT FREQ  . Infection    YEAST INF;NOT FREQ  . Infection    BV X 1;CURRENTLY TAKING FLAGYL FOR EX  . NSVD (normal spontaneous vaginal delivery) 09/30/2012   Preterm delivery at 34 weeks; IOL for PPROM  . Polycystic ovarian syndrome 2009   by Dr. Gaynell Face  . Preterm labor   . Vaginal Pap smear, abnormal     Past Surgical History:  Procedure Laterality Date  . CERVICAL CERCLAGE  07/01/2012   Procedure: CERCLAGE CERVICAL;  Surgeon: Esmeralda Arthur, MD;  Location: [facility];  Service: Gynecology;  Laterality: N/A;  . CERVICAL CERCLAGE N/A 09/01/2015   Procedure: CERCLAGE CERVICAL;  Surgeon: Jaymes Graff, MD;  Location: [facility];  Service: Gynecology;  Laterality: N/A;  . CERVICAL CERCLAGE N/A 12/15/2015   Procedure: CERCLAGE CERVICAL removal;  Surgeon: Jaymes Graff, MD;  Location: [facility];  Service: Obstetrics;  Laterality: N/A;   . CESAREAN SECTION N/A 12/15/2015   Procedure: CESAREAN SECTION;  Surgeon: Jaymes Graff, MD;  Location: [facility];  Service: Obstetrics;  Laterality: N/A;  . CHOLECYSTECTOMY  november 2009  . COLPOSCOPY  2009  . WISDOM TOOTH EXTRACTION     ALL 4 REMOVED    Family History  Problem Relation Age of Onset  . Asthma Mother   . Diabetes Father   . Hypertension Father   . Cancer Maternal Grandmother     bone cancer  . Cancer Paternal Grandmother     breast cancer    Social History  Substance Use Topics  . Smoking status: Former Smoker    Packs/day: 0.50    Years: 10.00    Types: Cigarettes    Quit date: 06/28/2015  . Smokeless tobacco: Never Used     Comment: quit smoking with found out about pregnancy  . Alcohol use No    Allergies: No Known Allergies  Prescriptions Prior to Admission  Medication Sig Dispense Refill Last Dose  . HYDROcodone-acetaminophen (NORCO/VICODIN) 5-325 MG tablet Take 1-2 tablets by mouth every 4 (four) hours as needed for moderate pain. (Patient not taking: Reported on 10/16/2016) 30 tablet 0 Not Taking  . ibuprofen (ADVIL,MOTRIN) 600 MG tablet Take 1 tablet (600 mg  total) by mouth every 6 (six) hours as needed. (Patient not taking: Reported on 10/16/2016) 30 tablet 2 Not Taking  . Prenatal Vit-Fe Fumarate-FA (PRENATAL MULTIVITAMIN) TABS tablet Take 2 tablets by mouth daily at 12 noon.   Taking  . prenatal vitamin w/FE, FA (PRENATAL 1 + 1) 27-1 MG TABS tablet Take 1 tablet by mouth daily at 12 noon. (Patient not taking: Reported on 10/16/2016) 30 each 0 Not Taking    Review of Systems  Constitutional: Negative for chills and fever.  HENT: Negative for congestion and hearing loss.   Eyes: Negative for blurred vision and double vision.  Respiratory: Negative for cough and sputum production.   Cardiovascular: Negative for chest pain and palpitations.  Gastrointestinal: Negative for abdominal pain, heartburn, nausea and vomiting.  Genitourinary: Negative for  dysuria, frequency and urgency.  Musculoskeletal: Negative for myalgias and neck pain.  Skin: Negative for itching and rash.  Neurological: Negative for dizziness, tingling and headaches.  Endo/Heme/Allergies: Negative for environmental allergies. Does not bruise/bleed easily.  Psychiatric/Behavioral: Negative for depression and suicidal ideas.   Physical Exam   Blood pressure 134/77, pulse 75, temperature 98.3 F (36.8 C), temperature source Oral, resp. rate 16, height 5\' 7"  (1.702 m), weight 240 lb (108.9 kg), last menstrual period 08/03/2016, SpO2 98 %, unknown if currently breastfeeding.  Physical Exam  Constitutional: She is oriented to person, place, and time. She appears well-developed and well-nourished.  Obese  Cardiovascular: Normal rate and intact distal pulses.  Exam reveals no friction rub.   No murmur heard. Respiratory: Effort normal and breath sounds normal. No respiratory distress.  GI: Soft. Bowel sounds are normal. She exhibits no distension. There is no tenderness. There is no rebound and no guarding.  Genitourinary: Vagina normal and uterus normal.  Musculoskeletal: Normal range of motion. She exhibits no edema.  Neurological: She is alert and oriented to person, place, and time. No cranial nerve deficit.  Skin: Skin is warm and dry.  Psychiatric: She has a normal mood and affect. Her behavior is normal.    MAU Course  Procedures  MDM In MAU extensive conversation was had with the patient. I attempted to explain to her how although there is some concern for a missed abortion at this time is not an emergency as she is stable at this point. She repeatedly stated that nobody seems to care and I think is an emergency that I might have a dead baby and me. I explained to her that with a single ultrasound remain not be able to determine this and we wouldn't want to evacuate a normal pregnancy. She continued to express desire to have something done tonight to give her  definitive answer. I reiterated that without any medical indication I cannot do an ultrasound.  I discussed with my a attending Dr. Macon LargeAnyanwu the situation and she agreed that no further workup is indicated at this time. [facility] [place] or ultrasound to attempt to follow up sooner than the 29th as scheduled.  I did put a bedside ultrasound on the patient and did not see any fetal cardiac activity at this time. Did appear to be Oaks sac and fetal pole.  Assessment and Plan  #1: Pregnancy concern for missed abortion: Patient artery scheduled for viability ultrasound. Will await final ultrasound read after ultrasound on the 29th. She can attempt to follow up sooner if concerned. Given precautions to return to MAU or the office with bleeding fever or abdominal pain. Patient  voiced understanding.  Ernestina Pennaicholas Ihan Pat 10/18/2016, 2:06 AM

## 2016-10-24 LAB — HEMOGLOBINOPATHY EVALUATION
HEMOGLOBIN A2 QUANTITATION: 2.6 % (ref 0.7–3.1)
HEMOGLOBIN F QUANTITATION: 0 % (ref 0.0–2.0)
HGB A: 97.4 % (ref 94.0–98.0)
HGB C: 0 %
HGB S: 0 %

## 2016-10-24 LAB — OBSTETRIC PANEL, INCLUDING HIV
Antibody Screen: NEGATIVE
BASOS: 0 %
Basophils Absolute: 0 10*3/uL (ref 0.0–0.2)
EOS (ABSOLUTE): 0.2 10*3/uL (ref 0.0–0.4)
EOS: 2 %
HEMOGLOBIN: 13.2 g/dL (ref 11.1–15.9)
HEP B S AG: NEGATIVE
HIV SCREEN 4TH GENERATION: NONREACTIVE
Hematocrit: 39 % (ref 34.0–46.6)
Immature Grans (Abs): 0 10*3/uL (ref 0.0–0.1)
Immature Granulocytes: 0 %
LYMPHS ABS: 3.7 10*3/uL — AB (ref 0.7–3.1)
Lymphs: 35 %
MCH: 29.2 pg (ref 26.6–33.0)
MCHC: 33.8 g/dL (ref 31.5–35.7)
MCV: 86 fL (ref 79–97)
Monocytes Absolute: 0.8 10*3/uL (ref 0.1–0.9)
Monocytes: 8 %
NEUTROS ABS: 5.8 10*3/uL (ref 1.4–7.0)
Neutrophils: 55 %
PLATELETS: 349 10*3/uL (ref 150–379)
RBC: 4.52 x10E6/uL (ref 3.77–5.28)
RDW: 13.9 % (ref 12.3–15.4)
RH TYPE: POSITIVE
RPR: NONREACTIVE
Rubella Antibodies, IGG: 7.02 index (ref >0.99)
WBC: 10.5 10*3/uL (ref 3.4–10.8)

## 2016-10-24 LAB — VARICELLA ZOSTER ANTIBODY, IGG: Varicella zoster IgG: >4000 index (ref >165)

## 2016-10-24 LAB — CYSTIC FIBROSIS MUTATION 97: Interpretation: NOT DETECTED

## 2016-10-25 ENCOUNTER — Telehealth: Payer: Self-pay | Admitting: *Deleted

## 2016-10-25 ENCOUNTER — Ambulatory Visit (HOSPITAL_COMMUNITY)
Admission: RE | Admit: 2016-10-25 | Discharge: 2016-10-25 | Disposition: A | Discharge status: Home / Self Care | Payer: Medicaid Other | Source: Ambulatory Visit | Attending: Obstetrics and Gynecology | Admitting: Obstetrics and Gynecology

## 2016-10-25 ENCOUNTER — Encounter (HOSPITAL_COMMUNITY): Payer: Self-pay

## 2016-10-25 DIAGNOSIS — O0990 Supervision of high risk pregnancy, unspecified, unspecified trimester: Secondary | ICD-10-CM | POA: Diagnosis not present

## 2016-10-25 DIAGNOSIS — Z3A08 8 weeks gestation of pregnancy: Secondary | ICD-10-CM | POA: Insufficient documentation

## 2016-10-25 NOTE — Telephone Encounter (Signed)
Call patient and reschedule her for next week

## 2016-10-25 NOTE — Telephone Encounter (Signed)
Her weight is a risk factor for Diabetes in pregnancy. We offer early screening for that reason and again in the 3rd trimester. If she opts not to have it done, please document

## 2016-10-25 NOTE — Telephone Encounter (Signed)
-----   Message from Sarah AntiguaPeggy Constant, MD sent at 10/25/2016 10:35 AM EST ----- Please inform patient of normal ultrasound which shows that her pregnancy is much earlier than previously thought as I discussed with her at her initial ob visit. She is 8 weeks 6 days with due date 05/31/2017.  Her cerclage will be scheduled for the week of 12/25 pending the holiday schedule. Patient will be notified by mail with her surgery date  Thanks  Peggy

## 2016-10-25 NOTE — Telephone Encounter (Signed)
Patient informed of her results. Patient is not sure if she should do her glucose this early with her dates being changed. Told patient I would check with her provider and let her know. Patient canceled for the morning. Told patient we would call her back to reschedule when appropriate. (She admits she really does not want to do the testing)

## 2016-10-26 ENCOUNTER — Other Ambulatory Visit: Payer: Self-pay

## 2016-10-31 ENCOUNTER — Other Ambulatory Visit: Payer: Self-pay

## 2016-11-07 ENCOUNTER — Emergency Department (HOSPITAL_BASED_OUTPATIENT_CLINIC_OR_DEPARTMENT_OTHER)
Admission: EM | Admit: 2016-11-07 | Discharge: 2016-11-07 | Disposition: A | Discharge status: Home / Self Care | Payer: Medicaid Other | Attending: Dermatology | Admitting: Dermatology

## 2016-11-07 ENCOUNTER — Encounter (HOSPITAL_BASED_OUTPATIENT_CLINIC_OR_DEPARTMENT_OTHER): Payer: Self-pay | Admitting: Emergency Medicine

## 2016-11-07 DIAGNOSIS — Z87891 Personal history of nicotine dependence: Secondary | ICD-10-CM | POA: Insufficient documentation

## 2016-11-07 DIAGNOSIS — Z5321 Procedure and treatment not carried out due to patient leaving prior to being seen by health care provider: Secondary | ICD-10-CM | POA: Diagnosis not present

## 2016-11-07 DIAGNOSIS — O26899 Other specified pregnancy related conditions, unspecified trimester: Secondary | ICD-10-CM | POA: Insufficient documentation

## 2016-11-07 DIAGNOSIS — Z3A00 Weeks of gestation of pregnancy not specified: Secondary | ICD-10-CM | POA: Diagnosis not present

## 2016-11-07 DIAGNOSIS — R300 Dysuria: Secondary | ICD-10-CM | POA: Insufficient documentation

## 2016-11-07 LAB — URINALYSIS, ROUTINE W REFLEX MICROSCOPIC
Bilirubin Urine: NEGATIVE
GLUCOSE, UA: NEGATIVE mg/dL
Hgb urine dipstick: NEGATIVE
Ketones, ur: NEGATIVE mg/dL
LEUKOCYTES UA: NEGATIVE
NITRITE: NEGATIVE
PH: 6.5 (ref 5.0–8.0)
Protein, ur: NEGATIVE mg/dL
SPECIFIC GRAVITY, URINE: 1.027 (ref 1.005–1.030)

## 2016-11-07 NOTE — ED Notes (Signed)
Pt states she doesn't want to wait any longer 

## 2016-11-07 NOTE — ED Triage Notes (Addendum)
Patient c/o pain & burning with urination. Also reports pressure. Patient states this started on Friday. Patient taking cranberry AZO and increasing water intake to treat symptoms.   Patient is pregnant, EDD May 31, 2017

## 2016-11-13 ENCOUNTER — Ambulatory Visit (INDEPENDENT_AMBULATORY_CARE_PROVIDER_SITE_OTHER): Payer: Medicaid Other | Admitting: Obstetrics and Gynecology

## 2016-11-13 VITALS — BP 130/68 | HR 96 | Temp 99.0°F | Wt 237.0 lb

## 2016-11-13 DIAGNOSIS — O99211 Obesity complicating pregnancy, first trimester: Secondary | ICD-10-CM

## 2016-11-13 DIAGNOSIS — O34219 Maternal care for unspecified type scar from previous cesarean delivery: Secondary | ICD-10-CM

## 2016-11-13 DIAGNOSIS — E669 Obesity, unspecified: Secondary | ICD-10-CM

## 2016-11-13 DIAGNOSIS — O09299 Supervision of pregnancy with other poor reproductive or obstetric history, unspecified trimester: Secondary | ICD-10-CM

## 2016-11-13 DIAGNOSIS — O09219 Supervision of pregnancy with history of pre-term labor, unspecified trimester: Secondary | ICD-10-CM

## 2016-11-13 DIAGNOSIS — O0990 Supervision of high risk pregnancy, unspecified, unspecified trimester: Secondary | ICD-10-CM

## 2016-11-13 DIAGNOSIS — O09291 Supervision of pregnancy with other poor reproductive or obstetric history, first trimester: Secondary | ICD-10-CM

## 2016-11-13 DIAGNOSIS — O09211 Supervision of pregnancy with history of pre-term labor, first trimester: Secondary | ICD-10-CM

## 2016-11-13 DIAGNOSIS — O09899 Supervision of other high risk pregnancies, unspecified trimester: Secondary | ICD-10-CM

## 2016-11-13 NOTE — Progress Notes (Signed)
   PRENATAL VISIT NOTE  Subjective:  Sarah Sutton is a 25 y.o. G3P0202 at 3971w4d being seen today for ongoing prenatal care.  She is currently monitored for the following issues for this high-risk pregnancy and has History of incompetent cervix, currently pregnant; History of preterm delivery, currently pregnant; Short interval between pregnancies affecting pregnancy, antepartum; Previous cesarean section complicating pregnancy; Supervision of high risk pregnancy, antepartum; and Obesity complicating pregnancy on her problem list.  Patient reports no complaints.  Contractions: Not present. Vag. Bleeding: None.  Movement: Absent. Denies leaking of fluid.   The following portions of the patient's history were reviewed and updated as appropriate: allergies, current medications, past family history, past medical history, past social history, past surgical history and problem list. Problem list updated.  Objective:   Vitals:   11/13/16 0841  BP: 130/68  Pulse: 96  Temp: 99 F (37.2 C)    Fetal Status:     Movement: Absent     General:  Alert, oriented and cooperative. Patient is in no acute distress.  Skin: Skin is warm and dry. No rash noted.   Cardiovascular: Normal heart rate noted  Respiratory: Normal respiratory effort, no problems with respiration noted  Abdomen: Soft, gravid, appropriate for gestational age. Pain/Pressure: Absent     Pelvic:  Cervical exam deferred        Extremities: Normal range of motion.  Edema: None  Mental Status: Normal mood and affect. Normal behavior. Normal judgment and thought content.   Assessment and Plan:  Pregnancy: [id number] at 4571w4d  1. Supervision of high risk pregnancy, antepartum Patient is doing well without complaints She voiced some disatisfaction with her last visit regarding lack of ability to confim heart tones, discrepancy in GA and late scheduling of viability ultrasound. I explained to the patient that the viability ultrasound was  scheduled at a later date to allow the pregnancy to gown and confirm viability. She expressed understanding. Patient has a lot of anxiety regarding this pregnancy and the fact that a CL will not be performed weekly as it has been done with [facility]. Explained to the patient the standard of care regarding CL measurements. I encouraged the patient to perform her own research. I also explained to the patient why weekly CL following cerclage placement is not always recommended. Patient verbalized understanding - Anatomy ultrasound ordered  2. History of incompetent cervix, currently pregnant Cerclage scheduled on 12/26  3. History of preterm delivery, currently pregnant Start weekly 17-P at 16 weeks  4. Obesity affecting pregnancy in first trimester Early 1 hour today Patient did not return previously due to her dissatisfaction and the fact that it wasn't done with her last pregnancy. She now understands why and is willing to stay for 1 hr  5. Previous cesarean section complicating pregnancy Will discuss delivery options   6. Short interval between pregnancies affecting pregnancy, antepartum   General obstetric precautions including but not limited to vaginal bleeding, contractions, leaking of fluid and fetal movement were reviewed in detail with the patient. Please refer to After Visit Summary for other counseling recommendations.  Return in about 4 weeks (around 12/11/2016).   Catalina AntiguaPeggy Shubh Chiara, MD

## 2016-11-14 LAB — GLUCOSE, 1 HOUR GESTATIONAL: Gestational Diabetes Screen: 109 mg/dL (ref 65–139)

## 2016-11-16 NOTE — Patient Instructions (Addendum)
Your procedure is scheduled on:  Tuesday, Dec. 26, 2017  [facility] at:  12:45 PM  Pick up the phone at the desk and dial [phone].  Call this number if you have problems the morning of surgery: [phone].  Remember: Do NOT eat food:  After Midnight Monday  Do NOT drink clear liquids after:  10:30 AM day of surgery  Take these medicines the morning of surgery with a SIP OF WATER: None  Stop ALL herbal medications at this time   Do NOT wear jewelry (body piercing), metal hair clips/bobby pins, make-up, or nail polish. Do NOT wear lotions, powders, or perfumes.  You may wear deodorant. Do NOT shave for 48 hours prior to surgery. Do NOT bring valuables to the hospital. Contacts, dentures, or bridgework may not be worn into surgery.  Have a responsible adult drive you home and stay with you for 24 hours after your procedure

## 2016-11-17 ENCOUNTER — Encounter (HOSPITAL_COMMUNITY)
Admission: RE | Admit: 2016-11-17 | Discharge: 2016-11-17 | Disposition: A | Discharge status: Home / Self Care | Payer: Medicaid Other | Source: Ambulatory Visit | Attending: Obstetrics & Gynecology | Admitting: Obstetrics & Gynecology

## 2016-11-17 ENCOUNTER — Other Ambulatory Visit: Payer: Self-pay | Admitting: Obstetrics and Gynecology

## 2016-11-17 ENCOUNTER — Encounter (HOSPITAL_COMMUNITY): Payer: Self-pay

## 2016-11-17 DIAGNOSIS — Z01812 Encounter for preprocedural laboratory examination: Secondary | ICD-10-CM | POA: Insufficient documentation

## 2016-11-17 LAB — CBC
HCT: 36.1 % (ref 36.0–46.0)
HEMOGLOBIN: 12.7 g/dL (ref 12.0–15.0)
MCH: 30 pg (ref 26.0–34.0)
MCHC: 35.2 g/dL (ref 30.0–36.0)
MCV: 85.3 fL (ref 78.0–100.0)
Platelets: 317 10*3/uL (ref 150–400)
RBC: 4.23 MIL/uL (ref 3.87–5.11)
RDW: 13.6 % (ref 11.5–15.5)
WBC: 9.1 10*3/uL (ref 4.0–10.5)

## 2016-11-17 MED ORDER — DOXYLAMINE-PYRIDOXINE 10-10 MG PO TBEC: DELAYED_RELEASE_TABLET | ORAL | 6 refills | Status: DC

## 2016-11-21 ENCOUNTER — Ambulatory Visit (HOSPITAL_COMMUNITY)
Admission: RE | Admit: 2016-11-21 | Discharge: 2016-11-21 | Disposition: A | Discharge status: Home / Self Care | Payer: Medicaid Other | Source: Ambulatory Visit | Attending: Obstetrics & Gynecology | Admitting: Obstetrics & Gynecology

## 2016-11-21 ENCOUNTER — Telehealth: Payer: Self-pay | Admitting: *Deleted

## 2016-11-21 ENCOUNTER — Encounter (HOSPITAL_COMMUNITY): Payer: Self-pay | Admitting: Obstetrics & Gynecology

## 2016-11-21 ENCOUNTER — Encounter (HOSPITAL_COMMUNITY)
Admission: RE | Disposition: A | Discharge status: Home / Self Care | Payer: Self-pay | Source: Ambulatory Visit | Attending: Obstetrics & Gynecology

## 2016-11-21 ENCOUNTER — Ambulatory Visit (HOSPITAL_COMMUNITY): Payer: Medicaid Other | Admitting: Certified Registered Nurse Anesthetist

## 2016-11-21 DIAGNOSIS — Z87891 Personal history of nicotine dependence: Secondary | ICD-10-CM | POA: Insufficient documentation

## 2016-11-21 DIAGNOSIS — O09899 Supervision of other high risk pregnancies, unspecified trimester: Secondary | ICD-10-CM

## 2016-11-21 DIAGNOSIS — O3431 Maternal care for cervical incompetence, first trimester: Secondary | ICD-10-CM | POA: Diagnosis not present

## 2016-11-21 DIAGNOSIS — O09299 Supervision of pregnancy with other poor reproductive or obstetric history, unspecified trimester: Secondary | ICD-10-CM

## 2016-11-21 HISTORY — PX: CERVICAL CERCLAGE: SHX1329

## 2016-11-21 SURGERY — CERCLAGE, CERVIX, VAGINAL APPROACH
Anesthesia: Spinal | Site: Vagina

## 2016-11-21 MED ORDER — BUPIVACAINE HCL (PF) 0.5 % IJ SOLN
INTRAMUSCULAR | Status: AC
  Filled 2016-11-21: qty 30

## 2016-11-21 MED ORDER — BUPIVACAINE IN DEXTROSE 0.75-8.25 % IT SOLN
INTRATHECAL | Status: DC | PRN
  Administered 2016-11-21: 1.4 mL via INTRATHECAL

## 2016-11-21 MED ORDER — BUPIVACAINE HCL (PF) 0.5 % IJ SOLN
INTRAMUSCULAR | Status: DC | PRN
  Administered 2016-11-21: 10 mL

## 2016-11-21 MED ORDER — INDOMETHACIN 50 MG RE SUPP
RECTAL | Status: DC | PRN
  Administered 2016-11-21: 100 mg via RECTAL

## 2016-11-21 MED ORDER — INDOMETHACIN 50 MG RE SUPP
100.0000 mg | Freq: Once | RECTAL | Status: DC
  Filled 2016-11-21: qty 2

## 2016-11-21 MED ORDER — LACTATED RINGERS IV SOLN
INTRAVENOUS | Status: DC
  Administered 2016-11-21: 14:00:00 via INTRAVENOUS

## 2016-11-21 MED ORDER — FENTANYL CITRATE (PF) 100 MCG/2ML IJ SOLN: 25.0000 ug | INTRAMUSCULAR | Status: DC | PRN

## 2016-11-21 MED ORDER — LACTATED RINGERS IV SOLN
INTRAVENOUS | Status: DC
  Administered 2016-11-21: 125 mL/h via INTRAVENOUS

## 2016-11-21 MED ORDER — PROMETHAZINE HCL 25 MG/ML IJ SOLN: 6.2500 mg | INTRAMUSCULAR | Status: DC | PRN

## 2016-11-21 SURGICAL SUPPLY — 20 items
CANISTER SUCT 3000ML (MISCELLANEOUS) ×2 IMPLANT
CLOTH BEACON ORANGE TIMEOUT ST (SAFETY) ×2 IMPLANT
COUNTER NEEDLE 1200 MAGNETIC (NEEDLE) ×1 IMPLANT
GLOVE BIO SURGEON STRL SZ7 (GLOVE) ×2 IMPLANT
GLOVE BIOGEL PI IND STRL 7.0 (GLOVE) ×2 IMPLANT
GLOVE BIOGEL PI INDICATOR 7.0 (GLOVE) ×2
GOWN STRL REUS W/TWL LRG LVL3 (GOWN DISPOSABLE) ×4 IMPLANT
GOWN STRL REUS W/TWL XL LVL3 (GOWN DISPOSABLE) ×2 IMPLANT
NDL MAYO CATGUT SZ4 TPR NDL (NEEDLE) ×1 IMPLANT
NEEDLE MAYO CATGUT SZ4 (NEEDLE) ×2 IMPLANT
PACK VAGINAL MINOR WOMEN LF (CUSTOM PROCEDURE TRAY) ×2 IMPLANT
PAD OB MATERNITY 4.3X12.25 (PERSONAL CARE ITEMS) ×2 IMPLANT
PAD PREP 24X48 CUFFED NSTRL (MISCELLANEOUS) ×2 IMPLANT
SUT ETHIBOND  5 (SUTURE) ×1
SUT ETHIBOND 5 (SUTURE) ×1 IMPLANT
TOWEL OR 17X24 6PK STRL BLUE (TOWEL DISPOSABLE) ×4 IMPLANT
TRAY FOLEY CATH SILVER 14FR (SET/KITS/TRAYS/PACK) ×2 IMPLANT
TUBING NON-CON 1/4 X 20 CONN (TUBING) ×1 IMPLANT
WATER STERILE IRR 1000ML POUR (IV SOLUTION) ×2 IMPLANT
YANKAUER SUCT BULB TIP NO VENT (SUCTIONS) ×1 IMPLANT

## 2016-11-21 NOTE — Progress Notes (Signed)
Patient called and instructed to return to MAU if her symptoms do not subside or progress. Patient and her Aunt acknowledge this directive.

## 2016-11-21 NOTE — Discharge Instructions (Signed)
Nothing in vagina for 6 weeks.  No sex, tampons, and douching until after post op appointment  Notify your doctor if any of the following occur:  -Menstrual-like cramps, or sudden, constant, or occasional abdominal pain -Uterine contractions.  These may be painless and feel like the uterus is tightening or the baby is "balling up" -Low, dull backache, unrelieved by Tylenol -Intestinal cramps, with or without diarrhea, sometimes described as "gas pain" -Pelvic pressure (sudden increase) -Increase or change in vaginal discharge -Vaginal bleeding -A general feeling that "something is not right" -Leaking of fluid (sudden gushing of fluid from vagina-with or without continued leaking) -Fainting spells, "black outs" or loss of consciousness -Severe or continued nausea or vomiting -Pain or burning when urinating Chills or fever greater than 100.71F -Baby moving less than usual( Lie on your left side for one hour after a meal, and count the number of times your baby kicks.  If it is less than 5 times, get up, move around and drink some juice.  Repeat the test 30 minutes later.  If it is still less than 5 kicks in an hour, notify your doctor.) -Blurring vision or spots before your eyes  Cervical Cerclage, Care After This sheet gives you information about how to care for yourself after your procedure. Your health care provider may also give you more specific instructions. If you have problems or questions, contact your health care provider. What can I expect after the procedure? After your procedure, it is common to have:  Cramping in your abdomen.  Mucus discharge for several days.  Painful urination (dysuria).  Small drops of blood coming from your vagina (spotting). Follow these instructions at home:  Follow instructions from your health care provider about bed rest, if this applies. You may need to be on bed rest for up to 3 days.  Take over-the-counter and prescription medicines only as  told by your health care provider.  Do not drive or use heavy machinery while taking prescription pain medicine.  Keep track of your vaginal discharge and watch for any changes. If you notice changes, tell your health care provider.  Avoid physical activities and exercise until your health care provider approves. Ask your health care provider what activities are safe for you.  Until your health care provider approves:  Do not douche.  Do not have sexual intercourse.  Keep all pre-birth (prenatal) visits and all follow-up visits as told by your health care provider. This is important. You will probably have weekly visits to have your cervix checked, and you may need an ultrasound. Contact a health care provider if:  You have abnormal or bad-smelling vaginal discharge, such as clots.  You develop a rash on your skin. This may look like redness and swelling.  You become light-headed or feel like you are going to faint.  You have abdominal pain that does not get better with medicine.  You have persistent nausea or vomiting. Get help right away if:  You have vaginal bleeding that is heavier or more frequent than spotting.  You are leaking fluid or have a gush of fluid from your vagina (your water breaks).  You have a fever or chills.  You faint.  You have uterine contractions. These may feel like:  A back ache.  Lower abdominal pain.  Mild cramps, similar to menstrual cramps.  Tightening or pressure in your abdomen.  You think that your baby is not moving as much as usual, or you cannot feel your baby move.  You have chest pain.  You have shortness of breath. This information is not intended to replace advice given to you by your health care provider. Make sure you discuss any questions you have with your health care provider. Document Released: 09/03/2013 Document Revised: 07/12/2016 Document Reviewed: 06/16/2016 Elsevier Interactive Patient Education  2017 [employer]

## 2016-11-21 NOTE — Brief Op Note (Signed)
11/21/2016  2:50 PM  PATIENT:  Sarah BraverJessica Milbrath  25 y.o. female  PRE-OPERATIVE DIAGNOSIS:  CPT [phone] - Incompetent certix  POST-OPERATIVE DIAGNOSIS:  CPT 59320 - Incompetent certix  PROCEDURE:  Procedure(s): CERCLAGE CERVICAL (N/A)  SURGEON:  Surgeon(s) and Role:    * Willodean Rosenthalarolyn Harraway-Smith, MD - Primary  ANESTHESIA:   spinal  EBL:  Total I/O In: 450 [I.V.:450] Out: 102 [Urine:100; Blood:2]  BLOOD ADMINISTERED:none  DRAINS: none   LOCAL MEDICATIONS USED:  MARCAINE     SPECIMEN:  No Specimen  DISPOSITION OF SPECIMEN:  N/A  COUNTS:  YES  TOURNIQUET:  * No tourniquets in log *  DICTATION: .Note written in EPIC  PLAN OF CARE: Discharge to home after PACU  PATIENT DISPOSITION:  PACU - hemodynamically stable.   Delay start of Pharmacological VTE agent (>24hrs) due to surgical blood loss or risk of bleeding: not applicable  Complications: none immediate  Lenette Rau L. Harraway-Smith, M.D., [facility]

## 2016-11-21 NOTE — Progress Notes (Signed)
Patient in Phase 2 area receiving discharge instructions.  Vomited a large amount (approx. 500cc+) of ginger ale and crackers that she had eaten over 1 hour ago. Dr Shawnie PonsPratt made aware. Patient instructed to call provider tomorrow for follow up.

## 2016-11-21 NOTE — Telephone Encounter (Signed)
Attempted to call patient to schedule 2 week post op/cerclage/ob visit and pt's phone is not accepting calls at this time.Marland Kitchen..Marland Kitchen

## 2016-11-21 NOTE — Op Note (Signed)
11/21/2016  2:50 PM  PATIENT:  Sarah Sutton  25 y.o. female  PRE-OPERATIVE DIAGNOSIS:  CPT [phone] - Incompetent certix  POST-OPERATIVE DIAGNOSIS:  CPT 59320 - Incompetent certix  PROCEDURE:  Procedure(s): CERCLAGE CERVICAL (N/A)  SURGEON:  Surgeon(s) and Role:    * Willodean Rosenthalarolyn Harraway-Smith, MD - Primary  ANESTHESIA:   spinal  EBL:  Total I/O In: 450 [I.V.:450] Out: 102 [Urine:100; Blood:2]  BLOOD ADMINISTERED:none  DRAINS: none   LOCAL MEDICATIONS USED:  MARCAINE     SPECIMEN:  No Specimen  DISPOSITION OF SPECIMEN:  N/A  COUNTS:  YES  TOURNIQUET:  * No tourniquets in log *  DICTATION: .Note written in EPIC  PLAN OF CARE: Discharge to home after PACU  PATIENT DISPOSITION:  PACU - hemodynamically stable.   Delay start of Pharmacological VTE agent (>24hrs) due to surgical blood loss or risk of bleeding: not applicable  Complications: none immediate  INDICATIONS: 25 y.o. [id number] at 5767w5d with history of cervical incompetence, here for cerclage placement.   The risks of surgery were discussed in detail with the patient including but not limited to: bleeding; infection which may require antibiotic therapy; injury to cervix, vagina other surrounding organs; risk of ruptured membranes and/or preterm delivery and other postoperative or anesthesia complications.  Written informed consent was obtained.    FINDINGS:  About 3-3 1/2 cm palpable cervical length in the vagina, closed cervix, suture knot placed toward the right side.  PROCEDURE IN DETAIL:  The patient had sequential compression devices applied to her lower extremities while in the preoperative area.  Reassuring fetal heart rate was also obtained using a doppler. She was then taken to the operating room where spinal anesthesia was administered and was found to be adequate.  She was placed in the dorsal lithotomy, and was prepped and draped in a sterile manner. Her bladder was catheterized for an unmeasured amount of  clear, yellow urine. After an adequate timeout was performed, a vaginal speculum was then placed in the patient's vagina and a open ringed forcep was placed on the anterior lip of the cervix.    The posterior lip of the cervix was also grasped with ring forceps. A curved needle loaded with a number 5 Ethibond suture was inserted at 1 o'clock, as high as possible at the junction of the rugated vaginal epithelium and the smooth cervix, at least 2 cm above the external os. This was placed above the previous insertion site from the previous cerclage site. Five bites were taken circumferentially around the entire cervix in a purse-string fashion, each bite was deep enough to extend at least midway into the cervical stroma, but not into the endocervical canal. The two ends of the suture were then tied securely anteriorly and cut, leaving the ends long enough to grasp with a clamp at the  time for removal. There was minimal bleeding noted and the ring forceps were removed with good hemostasis noted.  Indomethacin 100 mg rectal suppository was placed.   All instruments were removed from the patient's vagina.  Instrument, needle and sponge counts were correct x 2. The patient tolerated the procedure well, and was taken to the recovery area awake and in stable condition. Reassuring fetal heart rate was also obtained using a doppler in the recovery area.  The patient will be discharged to home as per PACU criteria.  Routine postoperative instructions given.  She was told to take Tylenol prn.  She reports that on her prev 2 cerclages she did not  need narcotics and does not like the way they make her feel.   She will follow up in the clinic in 2 weeks for postoperative evaluation and ongoing prenatal care or sooner prn.  Margy Sumler L. Harraway-Smith, M.D., [facility]

## 2016-11-21 NOTE — H&P (Signed)
Preoperative History and Physical  Sarah BraverJessica Sutton is a 25 y.o. [id number] here for surgical management of cervical insufficiency  Proposed surgery: McDonald cerclage  Past Medical History:  Diagnosis Date  . Abnormal Pap smear 2007   COLPO DONE;LAST PAP 03/2011 WAS NORMAL  . Anemia    NO MEDS IN PAST  . Heart murmur    AS A CHILD, never caused any problems  . Infection    UTI;NOT FREQ  . Infection    YEAST INF;NOT FREQ  . Infection    BV X 1;CURRENTLY TAKING FLAGYL FOR EX  . NSVD (normal spontaneous vaginal delivery) 09/30/2012   Preterm delivery at 34 weeks; IOL for PPROM  . Polycystic ovarian syndrome 2009   by Dr. Gaynell FaceMarshall  . Preterm labor   . Vaginal Pap smear, abnormal    Past Surgical History:  Procedure Laterality Date  . CERVICAL CERCLAGE  07/01/2012   Procedure: CERCLAGE CERVICAL;  Surgeon: Esmeralda ArthurSandra A Rivard, MD;  Location: [facility];  Service: Gynecology;  Laterality: N/A;  . CERVICAL CERCLAGE N/A 09/01/2015   Procedure: CERCLAGE CERVICAL;  Surgeon: Jaymes GraffNaima Dillard, MD;  Location: [facility];  Service: Gynecology;  Laterality: N/A;  . CERVICAL CERCLAGE N/A 12/15/2015   Procedure: CERCLAGE CERVICAL removal;  Surgeon: Jaymes GraffNaima Dillard, MD;  Location: [facility];  Service: Obstetrics;  Laterality: N/A;  . CESAREAN SECTION N/A 12/15/2015   Procedure: CESAREAN SECTION;  Surgeon: Jaymes GraffNaima Dillard, MD;  Location: [facility];  Service: Obstetrics;  Laterality: N/A;  . CHOLECYSTECTOMY  november 2009  . COLPOSCOPY  2009  . WISDOM TOOTH EXTRACTION     ALL 4 REMOVED   OB History    Gravida Para Term Preterm AB Living   3 2 0 2 0 2   SAB TAB Ectopic Multiple Live Births   0 0 0 0 2     Patient denies any cervical dysplasia or STIs. Prescriptions Prior to Admission  Medication Sig Dispense Refill Last Dose  . Prenatal Vit-Fe Fumarate-FA (PRENATAL MULTIVITAMIN) TABS tablet Take 2 tablets by mouth daily at 12 noon.   11/20/2016 at Unknown time  . Doxylamine-Pyridoxine 10-10 MG TBEC Take 2 tabs at bedtime.  If symptoms persist after 2 days, take 1 tab in the morning and 2 tabs at bedtime 100 tablet 6     No Known Allergies Social History:   reports that she quit smoking about 16 months ago. Her smoking use included Cigarettes. She has a 5.00 pack-year smoking history. She has never used smokeless tobacco. She reports that she uses drugs, including Marijuana. She reports that she does not drink alcohol. Family History  Problem Relation Age of Onset  . Asthma Mother   . Diabetes Father   . Hypertension Father   . Cancer Maternal Grandmother     bone cancer  . Cancer Paternal Grandmother     breast cancer    Review of Systems: Noncontributory  PHYSICAL EXAM: Blood pressure 125/70, pulse 79, temperature 98.2 F (36.8 C), temperature source Oral, resp. rate 20, last menstrual period 08/03/2016, SpO2 100 %, unknown if currently breastfeeding. General appearance - alert, well appearing, and in no distress Chest - clear to auscultation, no wheezes, rales or rhonchi, symmetric air entry Heart - normal rate and regular rhythm Abdomen - soft, nontender, nondistended, no masses or organomegaly; +FHR Pelvic - examination not indicated Extremities - peripheral pulses normal, no pedal edema, no clubbing or cyanosis  Labs: Results for orders placed or performed during the hospital encounter of 11/17/16 (from  the past 336 hour(s))  CBC   Collection Time: 11/17/16 12:27 PM  Result Value Ref Range   WBC 9.1 4.0 - 10.5 K/uL   RBC 4.23 3.87 - 5.11 MIL/uL   Hemoglobin 12.7 12.0 - 15.0 g/dL   HCT 16.136.1 09.636.0 - 04.546.0 %   MCV 85.3 78.0 - 100.0 fL   MCH 30.0 26.0 - 34.0 pg   MCHC 35.2 30.0 - 36.0 g/dL   RDW 40.913.6 81.111.5 - 91.415.5 %   Platelets 317 150 - 400 K/uL  Results for orders placed or performed in visit on 11/13/16 (from the past 336 hour(s))  Glucose, 1 hour gestational   Collection Time: 11/13/16  2:22 PM  Result Value Ref Range   Gestational Diabetes Screen 109 65 - 139 mg/dL  Results for orders  placed or performed during the hospital encounter of 11/07/16 (from the past 336 hour(s))  Urinalysis, Routine w reflex microscopic   Collection Time: 11/07/16  1:27 PM  Result Value Ref Range   Color, Urine YELLOW YELLOW   APPearance CLOUDY (A) CLEAR   Specific Gravity, Urine 1.027 1.005 - 1.030   pH 6.5 5.0 - 8.0   Glucose, UA NEGATIVE NEGATIVE mg/dL   Hgb urine dipstick NEGATIVE NEGATIVE   Bilirubin Urine NEGATIVE NEGATIVE   Ketones, ur NEGATIVE NEGATIVE mg/dL   Protein, ur NEGATIVE NEGATIVE mg/dL   Nitrite NEGATIVE NEGATIVE   Leukocytes, UA NEGATIVE NEGATIVE    Imaging Studies: [place] Ob Comp Less 14 Wks  Result Date: 10/25/2016 CLINICAL DATA:  Dating, viability EXAM: OBSTETRIC <14 WK US AND TRANSVAGINAL OB US TECHNIQUE: Both transabdominal and transvaginal ultrasound examinations were performed for complete evaluation of the gestation as well as the maternal uterus, adnexal regions, and pelvic cul-de-sac. Transvaginal technique was performed to assess early pregnancy. COMPARISON:  12/10/2015 FINDINGS: Intrauterine gestational sac: Single Yolk sac:  Visualized Embryo:  Visualized Cardiac Activity: Visualized Heart Rate: 171  bpm MSD:   mm    w     d CRL:  22.4  mm   8 w   6 d                  US EDC: 05/31/2017 Subchorionic hemorrhage:  None visualized. Maternal uterus/adnexae: No adnexal masses.  No free fluid. IMPRESSION: Eight week 6 day intrauterine pregnancy. Fetal heart rate 171 beats per minute. No acute maternal findings. Electronically Signed   By: Charlett NoseKevin  Dover M.D.   On: 10/25/2016 10:01   [place] Ob Transvaginal  Result Date: 10/25/2016 CLINICAL DATA:  Dating, viability EXAM: OBSTETRIC <14 WK US AND TRANSVAGINAL OB US TECHNIQUE: Both transabdominal and transvaginal ultrasound examinations were performed for complete evaluation of the gestation as well as the maternal uterus, adnexal regions, and pelvic cul-de-sac. Transvaginal technique was performed to assess early pregnancy.  COMPARISON:  12/10/2015 FINDINGS: Intrauterine gestational sac: Single Yolk sac:  Visualized Embryo:  Visualized Cardiac Activity: Visualized Heart Rate: 171  bpm MSD:   mm    w     d CRL:  22.4  mm   8 w   6 d                  US EDC: 05/31/2017 Subchorionic hemorrhage:  None visualized. Maternal uterus/adnexae: No adnexal masses.  No free fluid. IMPRESSION: Eight week 6 day intrauterine pregnancy. Fetal heart rate 171 beats per minute. No acute maternal findings. Electronically Signed   By: Charlett NoseKevin  Dover M.D.   On: 10/25/2016 10:01    Assessment:  Patient Active Problem List   Diagnosis Date Noted  . History of incompetent cervix, currently pregnant 10/16/2016  . History of preterm delivery, currently pregnant 10/16/2016  . Short interval between pregnancies affecting pregnancy, antepartum 10/16/2016  . Previous cesarean section complicating pregnancy 10/16/2016  . Supervision of high risk pregnancy, antepartum 10/16/2016  . Obesity complicating pregnancy 10/16/2016    Plan: Patient will undergo surgical management with McDonald cerclage.   The risks of surgery were discussed in detail with the patient including but not limited to: bleeding which may require transfusion or reoperation; infection which may require antibiotics; injury to surrounding organs which may involve bowel, bladder, ureters ; need for additional procedures including laparoscopy or laparotomy; thromboembolic phenomenon, surgical site problems and other postoperative/anesthesia complications. Preterm rupture of membranes and preterm labor. Risk of rupture of membranes with the procedure. Failure to Likelihood of success in alleviating the patient's condition was discussed. Routine postoperative instructions will be reviewed with the patient and her family in detail after surgery.  The patient concurred with the proposed plan, giving informed written consent for the surgery.  Patient has been NPO since last night she will remain NPO  for procedure.  Anesthesia and OR aware.  Preoperative prophylactic antibiotics and SCDs ordered on call to the OR.  To OR when ready.  Ranelle Auker L. Erin Fulling, M.D., [facility] 11/21/2016 1:14 PM

## 2016-11-21 NOTE — Telephone Encounter (Signed)
FYI: The note I sent you was for a pt that I had JUST completed surgery on. These notes are typically sent while the pt is still in the hosp.  Generally in the recovery room but, sometimes still asleep.  Please wait until tomorrow to reconnect with the patient  Thx, clh-S

## 2016-11-21 NOTE — Progress Notes (Signed)
Patient stood up at bedside post recovery to ambulate to bathroom and was incontinent of large amount of diarrhea foul smelling stool. Patient states that she has had diarrhea for 4-5 weeks. Denies taking any antibiotics recently. Dr Shawnie PonsPratt made aware. Patient instructed to follow up with her provider.

## 2016-11-21 NOTE — Anesthesia Procedure Notes (Signed)
Spinal  Staffing Anesthesiologist: Nachum Derossett Performed: anesthesiologist  Preanesthetic Checklist Completed: patient identified, site marked, surgical consent, pre-op evaluation, timeout performed, IV checked, risks and benefits discussed and monitors and equipment checked Spinal Block Patient position: sitting Prep: site prepped and draped and DuraPrep Patient monitoring: blood pressure, continuous pulse ox and heart rate Approach: midline Location: L4-5 Injection technique: single-shot Needle Needle type: Pencan  Needle gauge: 24 G Needle length: 9 cm     

## 2016-11-21 NOTE — Transfer of Care (Signed)
Immediate Anesthesia Transfer of Care Note  Patient: Sarah BraverJessica Sutton  Procedure(s) Performed: Procedure(s): CERCLAGE CERVICAL (N/A)  Patient Location: PACU  Anesthesia Type:Spinal  Level of Consciousness: awake, alert  and oriented  Airway & Oxygen Therapy: Patient Spontanous Breathing  Post-op Assessment: Report given to RN and Post -op Vital signs reviewed and stable  Post vital signs: Reviewed and stable  Last Vitals:  Vitals:   11/21/16 1253  BP: 125/70  Pulse: 79  Resp: 20  Temp: 36.8 C    Last Pain:  Vitals:   11/21/16 1253  TempSrc: Oral      Patients Stated Pain Goal: 3 (11/21/16 1253)  Complications: No apparent anesthesia complications

## 2016-11-21 NOTE — Anesthesia Postprocedure Evaluation (Signed)
Anesthesia Post Note  Patient: Sarah BraverJessica Sutton  Procedure(s) Performed: Procedure(s) (LRB): CERCLAGE CERVICAL (N/A)  Patient location during evaluation: PACU Anesthesia Type: Spinal Level of consciousness: awake and alert and oriented Pain management: pain level controlled Vital Signs Assessment: post-procedure vital signs reviewed and stable Respiratory status: spontaneous breathing, nonlabored ventilation and respiratory function stable Cardiovascular status: blood pressure returned to baseline and stable Postop Assessment: patient able to bend at knees, no headache, no backache and spinal receding Anesthetic complications: no Comments: Vomiting and Diarrhea in PACU.         Last Vitals:  Vitals:   11/21/16 1745 11/21/16 1800  BP: 127/90 130/63  Pulse: 82 82  Resp: 14 16  Temp:  36.7 C    Last Pain:  Vitals:   11/21/16 1441  TempSrc:   PainSc: 0-No pain   Pain Goal: Patients Stated Pain Goal: 3 (11/21/16 1253)               Ramon Brant A.

## 2016-11-21 NOTE — Anesthesia Preprocedure Evaluation (Signed)
Anesthesia Evaluation  Patient identified by MRN, date of birth, ID band Patient awake    Reviewed: Allergy & Precautions, NPO status , Patient's Chart, lab work & pertinent test results  Airway Mallampati: II  TM Distance: >3 FB Neck ROM: Full    Dental   Pulmonary former smoker,    breath sounds clear to auscultation       Cardiovascular negative cardio ROS   Rhythm:Regular Rate:Normal     Neuro/Psych negative neurological ROS     GI/Hepatic negative GI ROS, Neg liver ROS,   Endo/Other  negative endocrine ROS  Renal/GU negative Renal ROS     Musculoskeletal   Abdominal   Peds  Hematology negative hematology ROS (+)   Anesthesia Other Findings   Reproductive/Obstetrics (+) Pregnancy                             Lab Results  Component Value Date   WBC 9.1 11/17/2016   HGB 12.7 11/17/2016   HCT 36.1 11/17/2016   MCV 85.3 11/17/2016   PLT 317 11/17/2016   Lab Results  Component Value Date   CREATININE 0.44 12/15/2015   BUN <5 (L) 12/15/2015   NA 136 12/15/2015   K 3.3 (L) 12/15/2015   CL 103 12/15/2015   CO2 24 12/15/2015    Anesthesia Physical Anesthesia Plan  ASA: II  Anesthesia Plan: Spinal   Post-op Pain Management:    Induction:   Airway Management Planned: Natural Airway  Additional Equipment:   Intra-op Plan:   Post-operative Plan:   Informed Consent: I have reviewed the patients History and Physical, chart, labs and discussed the procedure including the risks, benefits and alternatives for the proposed anesthesia with the patient or authorized representative who has indicated his/her understanding and acceptance.     Plan Discussed with: CRNA  Anesthesia Plan Comments:         Anesthesia Quick Evaluation

## 2016-11-22 ENCOUNTER — Inpatient Hospital Stay (HOSPITAL_COMMUNITY)
Admission: AD | Admit: 2016-11-22 | Discharge: 2016-11-22 | Disposition: A | Discharge status: Home / Self Care | Payer: Medicaid Other | Source: Ambulatory Visit | Attending: Family Medicine | Admitting: Family Medicine

## 2016-11-22 ENCOUNTER — Encounter (HOSPITAL_COMMUNITY): Payer: Self-pay

## 2016-11-22 DIAGNOSIS — Z87891 Personal history of nicotine dependence: Secondary | ICD-10-CM | POA: Diagnosis not present

## 2016-11-22 DIAGNOSIS — Z803 Family history of malignant neoplasm of breast: Secondary | ICD-10-CM | POA: Insufficient documentation

## 2016-11-22 DIAGNOSIS — Z9049 Acquired absence of other specified parts of digestive tract: Secondary | ICD-10-CM | POA: Insufficient documentation

## 2016-11-22 DIAGNOSIS — Z8249 Family history of ischemic heart disease and other diseases of the circulatory system: Secondary | ICD-10-CM | POA: Diagnosis not present

## 2016-11-22 DIAGNOSIS — Z808 Family history of malignant neoplasm of other organs or systems: Secondary | ICD-10-CM | POA: Insufficient documentation

## 2016-11-22 DIAGNOSIS — Z3A12 12 weeks gestation of pregnancy: Secondary | ICD-10-CM | POA: Diagnosis not present

## 2016-11-22 DIAGNOSIS — Z9889 Other specified postprocedural states: Secondary | ICD-10-CM | POA: Diagnosis not present

## 2016-11-22 DIAGNOSIS — Z825 Family history of asthma and other chronic lower respiratory diseases: Secondary | ICD-10-CM | POA: Insufficient documentation

## 2016-11-22 DIAGNOSIS — Z833 Family history of diabetes mellitus: Secondary | ICD-10-CM | POA: Insufficient documentation

## 2016-11-22 DIAGNOSIS — K529 Noninfective gastroenteritis and colitis, unspecified: Secondary | ICD-10-CM | POA: Insufficient documentation

## 2016-11-22 DIAGNOSIS — Z79899 Other long term (current) drug therapy: Secondary | ICD-10-CM | POA: Insufficient documentation

## 2016-11-22 DIAGNOSIS — O99281 Endocrine, nutritional and metabolic diseases complicating pregnancy, first trimester: Secondary | ICD-10-CM | POA: Diagnosis present

## 2016-11-22 LAB — CBC
HEMATOCRIT: 36.2 % (ref 36.0–46.0)
Hemoglobin: 12.7 g/dL (ref 12.0–15.0)
MCH: 29.7 pg (ref 26.0–34.0)
MCHC: 35.1 g/dL (ref 30.0–36.0)
MCV: 84.8 fL (ref 78.0–100.0)
Platelets: 304 10*3/uL (ref 150–400)
RBC: 4.27 MIL/uL (ref 3.87–5.11)
RDW: 13.6 % (ref 11.5–15.5)
WBC: 9.1 10*3/uL (ref 4.0–10.5)

## 2016-11-22 LAB — COMPREHENSIVE METABOLIC PANEL
ALBUMIN: 3.3 g/dL — AB (ref 3.5–5.0)
ALT: 13 U/L — ABNORMAL LOW (ref 14–54)
AST: 18 U/L (ref 15–41)
Alkaline Phosphatase: 50 U/L (ref 38–126)
Anion gap: 8 (ref 5–15)
BILIRUBIN TOTAL: 0.2 mg/dL — AB (ref 0.3–1.2)
CHLORIDE: 102 mmol/L (ref 101–111)
CO2: 24 mmol/L (ref 22–32)
Calcium: 8.9 mg/dL (ref 8.9–10.3)
Creatinine, Ser: 0.39 mg/dL — ABNORMAL LOW (ref 0.44–1.00)
GFR calc Af Amer: >60 mL/min (ref >60)
GFR calc non Af Amer: >60 mL/min (ref >60)
GLUCOSE: 93 mg/dL (ref 65–99)
POTASSIUM: 3.8 mmol/L (ref 3.5–5.1)
Sodium: 134 mmol/L — ABNORMAL LOW (ref 135–145)
TOTAL PROTEIN: 6.5 g/dL (ref 6.5–8.1)

## 2016-11-22 LAB — URINALYSIS, ROUTINE W REFLEX MICROSCOPIC
Bilirubin Urine: NEGATIVE
Glucose, UA: NEGATIVE mg/dL
Ketones, ur: 80 mg/dL — AB
NITRITE: NEGATIVE
PROTEIN: 30 mg/dL — AB
Specific Gravity, Urine: 1.019 (ref 1.005–1.030)
pH: 7 (ref 5.0–8.0)

## 2016-11-22 MED ORDER — DEXTROSE 5 % IN LACTATED RINGERS IV BOLUS
1000.0000 mL | Freq: Once | INTRAVENOUS | Status: AC
  Administered 2016-11-22: 1000 mL via INTRAVENOUS

## 2016-11-22 MED ORDER — PROMETHAZINE HCL 25 MG PO TABS: 25.0000 mg | ORAL_TABLET | Freq: Four times a day (QID) | ORAL | 0 refills | Status: DC | PRN

## 2016-11-22 MED ORDER — SODIUM CHLORIDE 0.9 % IV SOLN
8.0000 mg | Freq: Once | INTRAVENOUS | Status: AC
  Administered 2016-11-22: 8 mg via INTRAVENOUS
  Filled 2016-11-22: qty 4

## 2016-11-22 MED ORDER — ONDANSETRON 4 MG PO TBDP: 4.0000 mg | ORAL_TABLET | Freq: Three times a day (TID) | ORAL | 0 refills | Status: DC | PRN

## 2016-11-22 MED ORDER — LACTATED RINGERS IV BOLUS (SEPSIS)
1000.0000 mL | Freq: Once | INTRAVENOUS | Status: AC
  Administered 2016-11-22: 1000 mL via INTRAVENOUS

## 2016-11-22 NOTE — MAU Provider Note (Signed)
History     CSN: [id number]  Arrival date and time: 11/22/16 1114   First Provider Initiated Contact with Patient 11/22/16 1150      Chief Complaint  Patient presents with  . Emesis  . Diarrhea   HPI  Sarah Sutton is a 25 y.o. [id number] at [redacted]w[redacted]d who presents with n/v/d. Symptoms began yesterday after cerclage placement. States she had 2 episodes of watery stool & 2 episodes of vomiting yesterday prior to being discharged from PACU. Felt ok last night but symptoms returned this morning when she woke up. Vomited twice more this morning & had 2 more episodes of watery stool. States diarrhea is normal for her since having her gall bladder removed, but this diarrhea smelled different than normal. Has not taken antiemetic at home. Hasn't eaten anything since yesterday morning. Some lower abdominal cramping that she attributes to the surgery; has not treated pain.  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 Denies sick contacts. Denies fever/chills.   OB History    Gravida Para Term Preterm AB Living   3 2 0 2 0 2   SAB TAB Ectopic Multiple Live Births   0 0 0 0 2      Past Medical History:  Diagnosis Date  . Abnormal Pap smear 2007   COLPO DONE;LAST PAP 03/2011 WAS NORMAL  . Anemia    NO MEDS IN PAST  . Heart murmur    AS A CHILD, never caused any problems  . NSVD (normal spontaneous vaginal delivery) 09/30/2012   Preterm delivery at 34 weeks; IOL for PPROM  . Polycystic ovarian syndrome 2009   by Dr. Gaynell Face  . Preterm labor     Past Surgical History:  Procedure Laterality Date  . CERVICAL CERCLAGE  07/01/2012   Procedure: CERCLAGE CERVICAL;  Surgeon: Esmeralda Arthur, MD;  Location: [facility];  Service: Gynecology;  Laterality: N/A;  . CERVICAL CERCLAGE N/A 09/01/2015   Procedure: CERCLAGE CERVICAL;  Surgeon: Jaymes Graff, MD;  Location: [facility];  Service: Gynecology;  Laterality: N/A;  . CERVICAL CERCLAGE N/A 12/15/2015   Procedure: CERCLAGE CERVICAL removal;  Surgeon: Jaymes Graff, MD;   Location: [facility];  Service: Obstetrics;  Laterality: N/A;  . CESAREAN SECTION N/A 12/15/2015   Procedure: CESAREAN SECTION;  Surgeon: Jaymes Graff, MD;  Location: [facility];  Service: Obstetrics;  Laterality: N/A;  . CHOLECYSTECTOMY  november 2009  . COLPOSCOPY  2009  . WISDOM TOOTH EXTRACTION     ALL 4 REMOVED    Family History  Problem Relation Age of Onset  . Asthma Mother   . Diabetes Father   . Hypertension Father   . Cancer Maternal Grandmother     bone cancer  . Cancer Paternal Grandmother     breast cancer    Social History  Substance Use Topics  . Smoking status: Former Smoker    Packs/day: 0.50    Years: 10.00    Types: Cigarettes    Quit date: 06/28/2015  . Smokeless tobacco: Never Used     Comment: quit smoking with found out about pregnancy  . Alcohol use No    Allergies: No Known Allergies  Prescriptions Prior to Admission  Medication Sig Dispense Refill Last Dose  . Doxylamine-Pyridoxine 10-10 MG TBEC Take 2 tabs at bedtime. If symptoms persist after 2 days, take 1 tab in the morning and 2 tabs at bedtime 100 tablet 6   . Prenatal Vit-Fe Fumarate-FA (PRENATAL MULTIVITAMIN) TABS tablet Take 2 tablets by mouth daily  at 12 noon.   11/20/2016 at Unknown time    Review of Systems  Constitutional: Negative.   Gastrointestinal: Positive for abdominal pain, diarrhea, nausea and vomiting. Negative for blood in stool, constipation, heartburn and melena.  Genitourinary: Negative.    Physical Exam   Blood pressure 126/71, pulse 86, temperature 98.2 F (36.8 C), temperature source Oral, resp. rate 18, last menstrual period 08/03/2016, unknown if currently breastfeeding.  Physical Exam  Nursing note and vitals reviewed. Constitutional: She is oriented to person, place, and time. She appears well-developed and well-nourished. No distress.  HENT:  Head: Normocephalic and atraumatic.  Eyes: Conjunctivae are normal. Right eye exhibits no discharge. Left eye exhibits no  discharge. No scleral icterus.  Neck: Normal range of motion.  Cardiovascular: Normal rate, regular rhythm and normal heart sounds.   No murmur heard. Respiratory: Effort normal and breath sounds normal. No respiratory distress. She has no wheezes.  GI: Soft. Bowel sounds are normal. There is no tenderness. There is no rebound and no guarding.  Neurological: She is alert and oriented to person, place, and time.  Skin: Skin is warm and dry. She is not diaphoretic.  Psychiatric: She has a normal mood and affect. Her behavior is normal. Judgment and thought content normal.    MAU Course  Procedures Results for orders placed or performed during the hospital encounter of 11/22/16 (from the past 24 hour(s))  Urinalysis, Routine w reflex microscopic     Status: Abnormal   Collection Time: 11/22/16 11:25 AM  Result Value Ref Range   Color, Urine YELLOW YELLOW   APPearance HAZY (A) CLEAR   Specific Gravity, Urine 1.019 1.005 - 1.030   pH 7.0 5.0 - 8.0   Glucose, UA NEGATIVE NEGATIVE mg/dL   Hgb urine dipstick MODERATE (A) NEGATIVE   Bilirubin Urine NEGATIVE NEGATIVE   Ketones, ur 80 (A) NEGATIVE mg/dL   Protein, ur 30 (A) NEGATIVE mg/dL   Nitrite NEGATIVE NEGATIVE   Leukocytes, UA TRACE (A) NEGATIVE   RBC / HPF 0-5 0 - 5 RBC/hpf   WBC, UA 0-5 0 - 5 WBC/hpf   Bacteria, UA RARE (A) NONE SEEN   Squamous Epithelial / LPF 6-30 (A) NONE SEEN   Mucous PRESENT   CBC     Status: None   Collection Time: 11/22/16 12:35 PM  Result Value Ref Range   WBC 9.1 4.0 - 10.5 K/uL   RBC 4.27 3.87 - 5.11 MIL/uL   Hemoglobin 12.7 12.0 - 15.0 g/dL   HCT 16.136.2 09.636.0 - 04.546.0 %   MCV 84.8 78.0 - 100.0 fL   MCH 29.7 26.0 - 34.0 pg   MCHC 35.1 30.0 - 36.0 g/dL   RDW 40.913.6 81.111.5 - 91.415.5 %   Platelets 304 150 - 400 K/uL  Comprehensive metabolic panel     Status: Abnormal   Collection Time: 11/22/16 12:35 PM  Result Value Ref Range   Sodium 134 (L) 135 - 145 mmol/L   Potassium 3.8 3.5 - 5.1 mmol/L   Chloride 102  101 - 111 mmol/L   CO2 24 22 - 32 mmol/L   Glucose, Bld 93 65 - 99 mg/dL   BUN <5 (L) 6 - 20 mg/dL   Creatinine, Ser 7.820.39 (L) 0.44 - 1.00 mg/dL   Calcium 8.9 8.9 - 95.610.3 mg/dL   Total Protein 6.5 6.5 - 8.1 g/dL   Albumin 3.3 (L) 3.5 - 5.0 g/dL   AST 18 15 - 41 U/L   ALT 13 (L) 14 -  54 U/L   Alkaline Phosphatase 50 38 - 126 U/L   Total Bilirubin 0.2 (L) 0.3 - 1.2 mg/dL   GFR calc non Af Amer >60 >60 mL/min   GFR calc Af Amer >60 >60 mL/min   Anion gap 8 5 - 15   MDM Unable to hear FHT by doppler; fetal movement & cardiac activity visualized by informal bedside ultrasound U/a -- >80 ketones IV fluid bolus, D5LR & LR Zofran 8 mg IV CBC, CMP Patient reports improvement in symptoms  Pt had no episodes of vomiting or diarrhea while in MAU Assessment and Plan  A; 1. Acute gastroenteritis    P; Discharge home Rx phenergan & zofran [facility] s/p cerclage Discussed reasons to return to MAU  Judeth HornErin Shakisha Abend 11/22/2016, 11:50 AM

## 2016-11-22 NOTE — Discharge Instructions (Signed)

## 2016-11-22 NOTE — Progress Notes (Signed)
Patient states that her N/V/D started right after her surgery yesterday.  She also states she was fine during the night but once she got up this morning it all started back.

## 2016-11-22 NOTE — MAU Note (Addendum)
Had cerclage placed yesterday. Was sick yesterday before she left.  Felt ok last night.  When got up this morning, it started all over again.vomiting and loose stools.

## 2016-11-24 ENCOUNTER — Encounter: Payer: Self-pay | Admitting: *Deleted

## 2016-12-07 ENCOUNTER — Ambulatory Visit (INDEPENDENT_AMBULATORY_CARE_PROVIDER_SITE_OTHER): Payer: Medicaid Other | Admitting: Obstetrics & Gynecology

## 2016-12-07 DIAGNOSIS — O09292 Supervision of pregnancy with other poor reproductive or obstetric history, second trimester: Secondary | ICD-10-CM

## 2016-12-07 DIAGNOSIS — O09299 Supervision of pregnancy with other poor reproductive or obstetric history, unspecified trimester: Secondary | ICD-10-CM | POA: Diagnosis not present

## 2016-12-07 NOTE — Progress Notes (Signed)
   PRENATAL VISIT NOTE  Subjective:  Sarah Sutton is a 26 y.o. G3P0202 at 3937w0d being seen today for ongoing prenatal care.  She is currently monitored for the following issues for this high-risk pregnancy and has History of incompetent cervix, currently pregnant; History of preterm delivery, currently pregnant; Short interval between pregnancies affecting pregnancy, antepartum; Previous cesarean section complicating pregnancy; Supervision of high risk pregnancy, antepartum; and Obesity complicating pregnancy on her problem list.  Patient reports VMS.  Contractions: Not present. Vag. Bleeding: None.   . Denies leaking of fluid.   The following portions of the patient's history were reviewed and updated as appropriate: allergies, current medications, past family history, past medical history, past social history, past surgical history and problem list. Problem list updated.  Objective:   Vitals:   12/07/16 0832  BP: 120/85  Pulse: 89  Weight: 232 lb (105.2 kg)    Fetal Status: Fetal Heart Rate (bpm): 145         General:  Alert, oriented and cooperative. Patient is in no acute distress.  Skin: Skin is warm and dry. No rash noted.   Cardiovascular: Normal heart rate noted  Respiratory: Normal respiratory effort, no problems with respiration noted  Abdomen: Soft, gravid, appropriate for gestational age. Pain/Pressure: Absent     Pelvic:  Cervical exam deferred        Extremities: Normal range of motion.  Edema: None  Mental Status: Normal mood and affect. Normal behavior. Normal judgment and thought content.   Assessment and Plan:  Pregnancy: [id number] at 5137w0d  1. History of incompetent cervix, currently pregnant S/p cerclage - AFP, Quad Screen  general obstetric precautions including but not limited to vaginal bleeding, contractions, leaking of fluid and fetal movement were reviewed in detail with the patient. Please refer to After Visit Summary for other counseling recommendations.   Return in about 4 weeks (around 01/04/2017) for needs 17 p weekly starting 1 week.   Adam PhenixJames G Dillan Lunden, MD

## 2016-12-07 NOTE — Patient Instructions (Signed)
Second Trimester of Pregnancy The second trimester is from week 13 through week 28, month 4 through 6. This is often the time in pregnancy that you feel your best. Often times, morning sickness has lessened or quit. You may have more energy, and you may get hungry more often. Your unborn baby (fetus) is growing rapidly. At the end of the sixth month, he or she is about 9 inches long and weighs about 1 pounds. You will likely feel the baby move (quickening) between 18 and 20 weeks of pregnancy. Follow these instructions at home:  Avoid all smoking, herbs, and alcohol. Avoid drugs not approved by your doctor.  Do not use any tobacco products, including cigarettes, chewing tobacco, and electronic cigarettes. If you need help quitting, ask your doctor. You may get counseling or other support to help you quit.  Only take medicine as told by your doctor. Some medicines are safe and some are not during pregnancy.  Exercise only as told by your doctor. Stop exercising if you start having cramps.  Eat regular, healthy meals.  Wear a good support bra if your breasts are tender.  Do not use hot tubs, steam rooms, or saunas.  Wear your seat belt when driving.  Avoid raw meat, uncooked cheese, and liter boxes and soil used by cats.  Take your prenatal vitamins.  Take 1500-2000 milligrams of calcium daily starting at the 20th week of pregnancy until you deliver your baby.  Try taking medicine that helps you poop (stool softener) as needed, and if your doctor approves. Eat more fiber by eating fresh fruit, vegetables, and whole grains. Drink enough fluids to keep your pee (urine) clear or pale yellow.  Take warm water baths (sitz baths) to soothe pain or discomfort caused by hemorrhoids. Use hemorrhoid cream if your doctor approves.  If you have puffy, bulging veins (varicose veins), wear support hose. Raise (elevate) your feet for 15 minutes, 3-4 times a day. Limit salt in your diet.  Avoid heavy  lifting, wear low heals, and sit up straight.  Rest with your legs raised if you have leg cramps or low back pain.  Visit your dentist if you have not gone during your pregnancy. Use a soft toothbrush to brush your teeth. Be gentle when you floss.  You can have sex (intercourse) unless your doctor tells you not to.  Go to your doctor visits. Get help if:  You feel dizzy.  You have mild cramps or pressure in your lower belly (abdomen).  You have a nagging pain in your belly area.  You continue to feel sick to your stomach (nauseous), throw up (vomit), or have watery poop (diarrhea).  You have bad smelling fluid coming from your vagina.  You have pain with peeing (urination). Get help right away if:  You have a fever.  You are leaking fluid from your vagina.  You have spotting or bleeding from your vagina.  You have severe belly cramping or pain.  You lose or gain weight rapidly.  You have trouble catching your breath and have chest pain.  You notice sudden or extreme puffiness (swelling) of your face, hands, ankles, feet, or legs.  You have not felt the baby move in over an hour.  You have severe headaches that do not go away with medicine.  You have vision changes. This information is not intended to replace advice given to you by your health care provider. Make sure you discuss any questions you have with your health care   provider. Document Released: 02/07/2010 Document Revised: 04/20/2016 Document Reviewed: 01/14/2013 Elsevier Interactive Patient Education  2017 Elsevier Inc.  

## 2016-12-12 LAB — AFP, QUAD SCREEN
DIA Mom Value: 1.12
DIA VALUE (EIA): 167.41 pg/mL
DSR (By Age)    1 IN: 857
DSR (Second Trimester) 1 IN: 2960
GESTATIONAL AGE AFP: 15 wk
MSAFP MOM: 1.14
MSAFP: 26.9 ng/mL
MSHCG MOM: 2.1
MSHCG: 85622 m[IU]/mL
Maternal Age At EDD: 28 YEARS
Osb Risk: 10000
Test Results:: NEGATIVE
WEIGHT: 232 [lb_av]
uE3 Mom: 0.79
uE3 Value: 0.42 ng/mL

## 2016-12-15 ENCOUNTER — Ambulatory Visit (INDEPENDENT_AMBULATORY_CARE_PROVIDER_SITE_OTHER): Payer: Medicaid Other

## 2016-12-15 VITALS — BP 125/75 | HR 86 | Temp 98.6°F

## 2016-12-15 DIAGNOSIS — O09212 Supervision of pregnancy with history of pre-term labor, second trimester: Secondary | ICD-10-CM

## 2016-12-15 DIAGNOSIS — Z3A16 16 weeks gestation of pregnancy: Secondary | ICD-10-CM

## 2016-12-15 MED ORDER — HYDROXYPROGESTERONE CAPROATE 250 MG/ML IM OIL
250.0000 mg | TOPICAL_OIL | Freq: Once | INTRAMUSCULAR | Status: AC
  Administered 2016-12-15: 250 mg via INTRAMUSCULAR

## 2016-12-15 NOTE — Progress Notes (Signed)
Patient is in the office for 17p injection, administered and patient tolerated well .Marland Kitchen. Administrations This Visit    hydroxyprogesterone caproate (MAKENA) 250 mg/mL injection 250 mg    Admin Date 12/15/2016 Action Given Dose 250 mg Route Intramuscular Administered By Katrina StackBrittany D Stalling, RN

## 2016-12-22 ENCOUNTER — Ambulatory Visit: Payer: Medicaid Other

## 2016-12-22 DIAGNOSIS — O09299 Supervision of pregnancy with other poor reproductive or obstetric history, unspecified trimester: Secondary | ICD-10-CM

## 2016-12-22 MED ORDER — HYDROXYPROGESTERONE CAPROATE 250 MG/ML IM OIL
250.0000 mg | TOPICAL_OIL | Freq: Once | INTRAMUSCULAR | Status: AC
  Administered 2016-12-22: 250 mg via INTRAMUSCULAR

## 2016-12-22 NOTE — Progress Notes (Signed)
Nurse visit for weekly Makena given L outer quad w/o difficulty.

## 2016-12-29 ENCOUNTER — Ambulatory Visit: Payer: 59

## 2016-12-29 ENCOUNTER — Inpatient Hospital Stay (HOSPITAL_COMMUNITY): Payer: 59

## 2016-12-29 ENCOUNTER — Encounter (HOSPITAL_COMMUNITY): Payer: Self-pay

## 2016-12-29 ENCOUNTER — Inpatient Hospital Stay (HOSPITAL_COMMUNITY)
Admission: AD | Admit: 2016-12-29 | Discharge: 2016-12-29 | Disposition: A | Discharge status: Home / Self Care | Payer: 59 | Source: Ambulatory Visit | Attending: Obstetrics and Gynecology | Admitting: Obstetrics and Gynecology

## 2016-12-29 ENCOUNTER — Ambulatory Visit: Payer: Self-pay

## 2016-12-29 VITALS — BP 116/74 | HR 81 | Wt 232.0 lb

## 2016-12-29 DIAGNOSIS — O3432 Maternal care for cervical incompetence, second trimester: Secondary | ICD-10-CM | POA: Insufficient documentation

## 2016-12-29 DIAGNOSIS — O4692 Antepartum hemorrhage, unspecified, second trimester: Secondary | ICD-10-CM | POA: Diagnosis not present

## 2016-12-29 DIAGNOSIS — Z87891 Personal history of nicotine dependence: Secondary | ICD-10-CM | POA: Insufficient documentation

## 2016-12-29 DIAGNOSIS — Z9049 Acquired absence of other specified parts of digestive tract: Secondary | ICD-10-CM | POA: Diagnosis not present

## 2016-12-29 DIAGNOSIS — Z9889 Other specified postprocedural states: Secondary | ICD-10-CM | POA: Insufficient documentation

## 2016-12-29 DIAGNOSIS — O209 Hemorrhage in early pregnancy, unspecified: Secondary | ICD-10-CM | POA: Insufficient documentation

## 2016-12-29 DIAGNOSIS — Z3A18 18 weeks gestation of pregnancy: Secondary | ICD-10-CM | POA: Insufficient documentation

## 2016-12-29 DIAGNOSIS — O09292 Supervision of pregnancy with other poor reproductive or obstetric history, second trimester: Secondary | ICD-10-CM | POA: Diagnosis not present

## 2016-12-29 DIAGNOSIS — Z79899 Other long term (current) drug therapy: Secondary | ICD-10-CM | POA: Diagnosis not present

## 2016-12-29 DIAGNOSIS — O26892 Other specified pregnancy related conditions, second trimester: Secondary | ICD-10-CM | POA: Diagnosis not present

## 2016-12-29 DIAGNOSIS — O09299 Supervision of pregnancy with other poor reproductive or obstetric history, unspecified trimester: Secondary | ICD-10-CM

## 2016-12-29 LAB — WET PREP, GENITAL
CLUE CELLS WET PREP: NONE SEEN
Sperm: NONE SEEN
Trich, Wet Prep: NONE SEEN
YEAST WET PREP: NONE SEEN

## 2016-12-29 MED ORDER — HYDROXYPROGESTERONE CAPROATE 250 MG/ML IM OIL
250.0000 mg | TOPICAL_OIL | INTRAMUSCULAR | Status: DC
  Administered 2016-12-29 – 2017-04-19 (×12): 250 mg via INTRAMUSCULAR

## 2016-12-29 NOTE — MAU Note (Signed)
Patient presents with spotting and pressure since last night.

## 2016-12-29 NOTE — MAU Provider Note (Signed)
Chief Complaint: Vaginal Bleeding   First Provider Initiated Contact with Patient 12/29/16 1225      SUBJECTIVE HPI: Sarah Sutton is a 26 y.o. [id number] at 6410w1d by LMP who presents to maternity admissions sent from the office for vaginal spotting s/p cerclage 11/21/17.  She had prophylactic cerclage placed for hx of cervical incompetence. She reports leakage of small amount of clear fluid with vinegar type odor on and off since her cerclage.  She is wearing a pad for the leakage but reports it is light enough that a pantyliner is enough.  Today, she noticed pink spotting when wiping.  This is a new symptom.  She has not tried any treatments. It has no associated symptoms.  It is intermittent and unchanged since onset.  She reports no intercourse, no recent change in activities.  She had cerclage with previous pregnancy with cervical length measured by US weekly afterwards so is concerned that cerclage has not been checked after her procedure. She also was put on bed rest before but is still working as a [employer] employee this pregnancy so she is concerned that she is doing too much.  She denies abdominal pain, vaginal itching/burning, urinary symptoms, h/a, dizziness, n/v, or fever/chills.     HPI  Past Medical History:  Diagnosis Date  . Abnormal Pap smear 2007   COLPO DONE;LAST PAP 03/2011 WAS NORMAL  . Anemia    NO MEDS IN PAST  . Heart murmur    AS A CHILD, never caused any problems  . NSVD (normal spontaneous vaginal delivery) 09/30/2012   Preterm delivery at 34 weeks; IOL for PPROM  . Polycystic ovarian syndrome 2009   by Dr. Gaynell FaceMarshall  . Preterm labor    Past Surgical History:  Procedure Laterality Date  . CERVICAL CERCLAGE  07/01/2012   Procedure: CERCLAGE CERVICAL;  Surgeon: Esmeralda ArthurSandra A Rivard, MD;  Location: [facility];  Service: Gynecology;  Laterality: N/A;  . CERVICAL CERCLAGE N/A 09/01/2015   Procedure: CERCLAGE CERVICAL;  Surgeon: Jaymes GraffNaima Dillard, MD;  Location: [facility];  Service:  Gynecology;  Laterality: N/A;  . CERVICAL CERCLAGE N/A 12/15/2015   Procedure: CERCLAGE CERVICAL removal;  Surgeon: Jaymes GraffNaima Dillard, MD;  Location: [facility];  Service: Obstetrics;  Laterality: N/A;  . CERVICAL CERCLAGE N/A 11/21/2016   Procedure: CERCLAGE CERVICAL;  Surgeon: Willodean Rosenthalarolyn Harraway-Smith, MD;  Location: [facility];  Service: Gynecology;  Laterality: N/A;  . CESAREAN SECTION N/A 12/15/2015   Procedure: CESAREAN SECTION;  Surgeon: Jaymes GraffNaima Dillard, MD;  Location: [facility];  Service: Obstetrics;  Laterality: N/A;  . CHOLECYSTECTOMY  november 2009  . COLPOSCOPY  2009  . WISDOM TOOTH EXTRACTION     ALL 4 REMOVED   Social History   Social History  . Marital status: Single    Spouse name: N/A  . Number of children: 0  . Years of education: 3813   Occupational History  . CNA [employer]   Social History Main Topics  . Smoking status: Former Smoker    Packs/day: 0.50    Years: 10.00    Types: Cigarettes    Quit date: 06/28/2015  . Smokeless tobacco: Never Used     Comment: quit smoking with found out about pregnancy  . Alcohol use No  . Drug use: Yes    Types: Marijuana     Comment: last use 08/20/15  . Sexual activity: Yes    Partners: Male    Birth control/ protection: None     Comment: EDD 06/10/2017  Other Topics Concern  . Not on file   Social History Narrative  . No narrative on file   No current facility-administered medications on file prior to encounter.    Current Outpatient Prescriptions on File Prior to Encounter  Medication Sig Dispense Refill  . Prenatal Vit-Fe Fumarate-FA (PRENATAL MULTIVITAMIN) TABS tablet Take 1 tablet by mouth daily at 12 noon.     . ondansetron (ZOFRAN ODT) 4 MG disintegrating tablet Take 1 tablet (4 mg total) by mouth every 8 (eight) hours as needed for nausea or vomiting. (Patient not taking: Reported on 12/07/2016) 15 tablet 0  . promethazine (PHENERGAN) 25 MG tablet Take 1 tablet (25 mg total) by mouth every 6 (six) hours as needed for  nausea or vomiting. (Patient not taking: Reported on 12/07/2016) 30 tablet 0   No Known Allergies  ROS:  Review of Systems  Constitutional: Negative for chills, fatigue and fever.  Eyes: Negative for visual disturbance.  Respiratory: Negative for shortness of breath.   Cardiovascular: Negative for chest pain.  Gastrointestinal: Negative for abdominal pain, nausea and vomiting.  Genitourinary: Positive for vaginal bleeding and vaginal discharge. Negative for difficulty urinating, dysuria, flank pain, pelvic pain and vaginal pain.  Neurological: Negative for dizziness and headaches.  Psychiatric/Behavioral: Negative.      I have reviewed patient's Past Medical Hx, Surgical Hx, Family Hx, Social Hx, medications and allergies.   Physical Exam   Patient Vitals for the past 24 hrs:  BP Temp Temp src Pulse Resp Weight  12/29/16 1157 141/71 97.9 F (36.6 C) Oral 85 18 232 lb (105.2 kg)   Constitutional: Well-developed, well-nourished female in no acute distress.  Cardiovascular: normal rate Respiratory: normal effort GI: Abd soft, non-tender. Pos BS x 4 MS: Extremities nontender, no edema, normal ROM Neurologic: Alert and oriented x 4.  GU: Neg CVAT.  Pelvic exam: Cervix pink, visually closed, without lesion, cerclage present and in place with no tension noted, scant light brown discharge, vaginal walls and external genitalia normal  Negative pooling of fluid, ferning slide collected and negative today  FHT 143 by doppler  LAB RESULTS Results for orders placed or performed during the hospital encounter of 12/29/16 (from the past 24 hour(s))  Wet prep, genital     Status: Abnormal   Collection Time: 12/29/16  1:48 PM  Result Value Ref Range   Yeast Wet Prep HPF POC NONE SEEN NONE SEEN   Trich, Wet Prep NONE SEEN NONE SEEN   Clue Cells Wet Prep HPF POC NONE SEEN NONE SEEN   WBC, Wet Prep HPF POC TOO NUMEROUS TO COUNT (A) NONE SEEN   Sperm NONE SEEN     A/Positive/-- (11/20  1512)  IMAGING No results found. Preliminary report with subjectively normal fluid (largest pocket 6.8cm), normal FHR, no evidence of previa, and cervical length 3.1 cm with cerclage visible  MAU Management/MDM: Ordered labs and [place] and reviewed results. No evidence of ROM today or vaginal infection.  Cerclage in place with cervical length of 3.1 cm with normal fluid level on today's [place].  No active bleeding noted on exam.  D/C home with bleeding precautions, f/u with Dr Debroah Loop as scheduled at [facility] on Thursday. Pt stable at time of discharge.  ASSESSMENT 1. Vaginal bleeding in pregnancy, second trimester   2. History of incompetent cervix, currently pregnant   3. Cervical cerclage suture present in second trimester     PLAN Discharge home Allergies as of 12/29/2016   No Known Allergies  Medication List    STOP taking these medications   ondansetron 4 MG disintegrating tablet Commonly known as:  ZOFRAN ODT   promethazine 25 MG tablet Commonly known as:  PHENERGAN     TAKE these medications   prenatal multivitamin Tabs tablet Take 1 tablet by mouth daily at 12 noon.      Follow-up Information    [facility] Follow up.   Why:  As scheduled 01/03/17 with Dr Debroah Loop. Return to MAU as needed for emergencies. Contact information: [address] [place] [address] [phone]          Sharen Counter Certified Nurse-Midwife 12/29/2016  2:39 PM

## 2016-12-29 NOTE — Progress Notes (Signed)
Pt here for 17-P, but informed RN that she was having spotting. Due to Cerclage in place for Hx cervical insufficiency and unknown placentation, will sent to MAU for US and further eval. Dr. Vergie LivingPickens and Sharen CounterLisa Leftwich Kirby, CNM notified.

## 2016-12-29 NOTE — Progress Notes (Signed)
Administered 17p and patient tolerated well .Marland Kitchen. Administrations This Visit    hydroxyprogesterone caproate (MAKENA) 250 mg/mL injection 250 mg    Admin Date 12/29/2016 Action Given Dose 250 mg Route Intramuscular Administered By Maretta Beesarol J Deann Mclaine, RMA

## 2017-01-03 ENCOUNTER — Ambulatory Visit (INDEPENDENT_AMBULATORY_CARE_PROVIDER_SITE_OTHER): Payer: 59 | Admitting: Obstetrics & Gynecology

## 2017-01-03 DIAGNOSIS — O09212 Supervision of pregnancy with history of pre-term labor, second trimester: Secondary | ICD-10-CM

## 2017-01-03 DIAGNOSIS — O0990 Supervision of high risk pregnancy, unspecified, unspecified trimester: Secondary | ICD-10-CM

## 2017-01-03 MED ORDER — AZITHROMYCIN 500 MG PO TABS: 1000.0000 mg | ORAL_TABLET | Freq: Once | ORAL | 1 refills | Status: AC

## 2017-01-03 MED ORDER — HYDROXYPROGESTERONE CAPROATE 250 MG/ML IM OIL
250.0000 mg | TOPICAL_OIL | Freq: Once | INTRAMUSCULAR | Status: AC
  Administered 2017-01-03: 250 mg via INTRAMUSCULAR

## 2017-01-03 NOTE — Progress Notes (Signed)
   PRENATAL VISIT NOTE  Subjective:  Sarah Sutton is a 26 y.o. [id number] at 3311w6d being seen today for ongoing prenatal care.  She is currently monitored for the following issues for this high-risk pregnancy and has History of incompetent cervix, currently pregnant; History of preterm delivery, currently pregnant; Short interval between pregnancies affecting pregnancy, antepartum; Previous cesarean section complicating pregnancy; Supervision of high risk pregnancy, antepartum; and Obesity complicating pregnancy on her problem list.  Patient reports vaginal irritation and discharge.  Contractions: Not present. Vag. Bleeding: None.  Movement: Absent. Denies leaking of fluid.   The following portions of the patient's history were reviewed and updated as appropriate: allergies, current medications, past family history, past medical history, past social history, past surgical history and problem list. Problem list updated.  Objective:   Vitals:   01/03/17 0955  BP: 131/73  Pulse: 86  Temp: 98.1 F (36.7 C)  Weight: 229 lb 3.2 oz (104 kg)    Fetal Status: Fetal Heart Rate (bpm): 145   Movement: Absent     General:  Alert, oriented and cooperative. Patient is in no acute distress.  Skin: Skin is warm and dry. No rash noted.   Cardiovascular: Normal heart rate noted  Respiratory: Normal respiratory effort, no problems with respiration noted  Abdomen: Soft, gravid, appropriate for gestational age. Pain/Pressure: Present     Pelvic:  Cervical exam deferred        Extremities: Normal range of motion.  Edema: None  Mental Status: Normal mood and affect. Normal behavior. Normal judgment and thought content.   Assessment and Plan:  Pregnancy: [id number] at 5211w6d  1. Supervision of high risk pregnancy, antepartum Possible cervicitis with WBC on recent wet prep  Preterm labor symptoms and general obstetric precautions including but not limited to vaginal bleeding, contractions, leaking of fluid and  fetal movement were reviewed in detail with the patient. Please refer to After Visit Summary for other counseling recommendations.  Return in about 4 weeks (around 01/31/2017). Azithromycin 1 g single dose prescribed   Adam PhenixJames G Waseem Suess, MD

## 2017-01-03 NOTE — Patient Instructions (Signed)
Preterm Labor and Birth Information °Pregnancy normally lasts 39-41 weeks. Preterm labor is when labor starts early. It starts before you have been pregnant for 37 whole weeks. °What are the risk factors for preterm labor? °Preterm labor is more likely to occur in women who: °· Have an infection while pregnant. °· Have a cervix that is short. °· Have gone into preterm labor before. °· Have had surgery on their cervix. °· Are younger than age 28. °· Are older than age 35. °· Are African American. °· Are pregnant with two or more babies. °· Take street drugs while pregnant. °· Smoke while pregnant. °· Do not gain enough weight while pregnant. °· Got pregnant right after another pregnancy. °What are the symptoms of preterm labor? °Symptoms of preterm labor include: °· Cramps. The cramps may feel like the cramps some women get during their period. The cramps may happen with watery poop (diarrhea). °· Pain in the belly (abdomen). °· Pain in the lower back. °· Regular contractions or tightening. It may feel like your belly is getting tighter. °· Pressure in the lower belly that seems to get stronger. °· More fluid (discharge) leaking from the vagina. The fluid may be watery or bloody. °· Water breaking. °Why is it important to notice signs of preterm labor? °Babies who are born early may not be fully developed. They have a higher chance for: °· Long-term heart problems. °· Long-term lung problems. °· Trouble controlling body systems, like breathing. °· Bleeding in the brain. °· A condition called cerebral palsy. °· Learning difficulties. °· Death. °These risks are highest for babies who are born before 34 weeks of pregnancy. °How is preterm labor treated? °Treatment depends on: °· How long you were pregnant. °· Your condition. °· The health of your baby. °Treatment may involve: °· Having a stitch (suture) placed in your cervix. When you give birth, your cervix opens so the baby can come out. The stitch keeps the cervix  from opening too soon. °· Staying at the hospital. °· Taking or getting medicines, such as: °¨ Hormone medicines. °¨ Medicines to stop contractions. °¨ Medicines to help the baby’s lungs develop. °¨ Medicines to prevent your baby from having cerebral palsy. °What should I do if I am in preterm labor? °If you think you are going into labor too soon, call your doctor right away. °How can I prevent preterm labor? °· Do not use any tobacco products. °¨ Examples of these are cigarettes, chewing tobacco, and e-cigarettes. °¨ If you need help quitting, ask your doctor. °· Do not use street drugs. °· Do not use any medicines unless you ask your doctor if they are safe for you. °· Talk with your doctor before taking any herbal supplements. °· Make sure you gain enough weight. °· Watch for infection. If you think you might have an infection, get it checked right away. °· If you have gone into preterm labor before, tell your doctor. °This information is not intended to replace advice given to you by your health care provider. Make sure you discuss any questions you have with your health care provider. °Document Released: 02/09/2009 Document Revised: 04/25/2016 Document Reviewed: 04/05/2016 °Elsevier Interactive Patient Education © 2017 Elsevier Inc. ° °

## 2017-01-05 ENCOUNTER — Encounter (HOSPITAL_COMMUNITY): Payer: Self-pay

## 2017-01-05 ENCOUNTER — Other Ambulatory Visit: Payer: Self-pay | Admitting: Obstetrics and Gynecology

## 2017-01-05 ENCOUNTER — Ambulatory Visit (HOSPITAL_COMMUNITY)
Admission: RE | Admit: 2017-01-05 | Discharge: 2017-01-05 | Disposition: A | Discharge status: Home / Self Care | Payer: 59 | Source: Ambulatory Visit | Attending: Obstetrics and Gynecology | Admitting: Obstetrics and Gynecology

## 2017-01-05 VITALS — BP 134/68 | HR 84 | Wt 231.6 lb

## 2017-01-05 DIAGNOSIS — O09212 Supervision of pregnancy with history of pre-term labor, second trimester: Secondary | ICD-10-CM | POA: Diagnosis not present

## 2017-01-05 DIAGNOSIS — Z3A19 19 weeks gestation of pregnancy: Secondary | ICD-10-CM | POA: Diagnosis not present

## 2017-01-05 DIAGNOSIS — O99212 Obesity complicating pregnancy, second trimester: Secondary | ICD-10-CM | POA: Diagnosis not present

## 2017-01-05 DIAGNOSIS — O34219 Maternal care for unspecified type scar from previous cesarean delivery: Secondary | ICD-10-CM

## 2017-01-05 DIAGNOSIS — Z3689 Encounter for other specified antenatal screening: Secondary | ICD-10-CM

## 2017-01-05 DIAGNOSIS — O3432 Maternal care for cervical incompetence, second trimester: Secondary | ICD-10-CM

## 2017-01-05 DIAGNOSIS — O0990 Supervision of high risk pregnancy, unspecified, unspecified trimester: Secondary | ICD-10-CM

## 2017-01-05 DIAGNOSIS — O0992 Supervision of high risk pregnancy, unspecified, second trimester: Secondary | ICD-10-CM | POA: Insufficient documentation

## 2017-01-05 DIAGNOSIS — O09299 Supervision of pregnancy with other poor reproductive or obstetric history, unspecified trimester: Secondary | ICD-10-CM

## 2017-01-10 ENCOUNTER — Encounter: Payer: Self-pay | Admitting: Obstetrics and Gynecology

## 2017-01-10 DIAGNOSIS — O358XX0 Maternal care for other (suspected) fetal abnormality and damage, not applicable or unspecified: Secondary | ICD-10-CM | POA: Insufficient documentation

## 2017-01-10 DIAGNOSIS — O35EXX0 Maternal care for other (suspected) fetal abnormality and damage, fetal genitourinary anomalies, not applicable or unspecified: Secondary | ICD-10-CM | POA: Insufficient documentation

## 2017-01-11 ENCOUNTER — Ambulatory Visit (INDEPENDENT_AMBULATORY_CARE_PROVIDER_SITE_OTHER): Payer: 59

## 2017-01-11 DIAGNOSIS — O0990 Supervision of high risk pregnancy, unspecified, unspecified trimester: Secondary | ICD-10-CM

## 2017-01-11 DIAGNOSIS — O09212 Supervision of pregnancy with history of pre-term labor, second trimester: Secondary | ICD-10-CM | POA: Diagnosis not present

## 2017-01-11 NOTE — Progress Notes (Signed)
Patient given 17 P injection. Kairav Russomanno RN BSN 

## 2017-01-18 ENCOUNTER — Ambulatory Visit: Payer: Self-pay

## 2017-01-19 ENCOUNTER — Ambulatory Visit: Payer: 59

## 2017-01-19 DIAGNOSIS — O0990 Supervision of high risk pregnancy, unspecified, unspecified trimester: Secondary | ICD-10-CM

## 2017-01-19 NOTE — Progress Notes (Signed)
Patient presents for 17P Injection. [place] Patient tolerated well. Administrations This Visit    hydroxyprogesterone caproate (MAKENA) 250 mg/mL injection 250 mg    Admin Date 01/19/2017 Action Given Dose 250 mg Route Intramuscular Administered By Maretta Beesarol J Savan Ruta, RMA

## 2017-01-23 ENCOUNTER — Ambulatory Visit: Payer: Self-pay

## 2017-01-25 ENCOUNTER — Ambulatory Visit: Payer: 59

## 2017-01-25 DIAGNOSIS — O0990 Supervision of high risk pregnancy, unspecified, unspecified trimester: Secondary | ICD-10-CM

## 2017-01-25 NOTE — Progress Notes (Signed)
Patient presents for her 17P Injection. Shot [place]. Patient tolerated well.  Administrations This Visit    hydroxyprogesterone caproate (MAKENA) 250 mg/mL injection 250 mg    Admin Date 01/25/2017 Action Given Dose 250 mg Route Intramuscular Administered By Maretta Beesarol J McGlashan, RMA

## 2017-02-01 ENCOUNTER — Ambulatory Visit (INDEPENDENT_AMBULATORY_CARE_PROVIDER_SITE_OTHER): Payer: 59 | Admitting: Obstetrics & Gynecology

## 2017-02-01 VITALS — BP 141/74 | HR 85 | Wt 230.0 lb

## 2017-02-01 DIAGNOSIS — O3430 Maternal care for cervical incompetence, unspecified trimester: Secondary | ICD-10-CM | POA: Insufficient documentation

## 2017-02-01 DIAGNOSIS — O132 Gestational [pregnancy-induced] hypertension without significant proteinuria, second trimester: Secondary | ICD-10-CM | POA: Diagnosis not present

## 2017-02-01 DIAGNOSIS — O0990 Supervision of high risk pregnancy, unspecified, unspecified trimester: Secondary | ICD-10-CM

## 2017-02-01 DIAGNOSIS — O162 Unspecified maternal hypertension, second trimester: Secondary | ICD-10-CM

## 2017-02-01 DIAGNOSIS — O09212 Supervision of pregnancy with history of pre-term labor, second trimester: Secondary | ICD-10-CM

## 2017-02-01 DIAGNOSIS — O3432 Maternal care for cervical incompetence, second trimester: Secondary | ICD-10-CM

## 2017-02-01 DIAGNOSIS — O09219 Supervision of pregnancy with history of pre-term labor, unspecified trimester: Principal | ICD-10-CM

## 2017-02-01 DIAGNOSIS — O09899 Supervision of other high risk pregnancies, unspecified trimester: Secondary | ICD-10-CM

## 2017-02-01 MED ORDER — HYDROXYPROGESTERONE CAPROATE 250 MG/ML IM OIL
250.0000 mg | TOPICAL_OIL | Freq: Once | INTRAMUSCULAR | Status: AC
  Administered 2017-03-01: 250 mg via INTRAMUSCULAR

## 2017-02-01 NOTE — Progress Notes (Signed)
Patient presents for ROB and 17P Injection. Shot given in [place]. Patient tolerated well. Administrations This Visit    hydroxyprogesterone caproate (MAKENA) 250 mg/mL injection 250 mg    Admin Date 02/01/2017 Action Given Dose 250 mg Route Intramuscular Administered By Maretta Beesarol J Christino Mcglinchey, RMA

## 2017-02-01 NOTE — Addendum Note (Signed)
Addended by: Jaynie CollinsANYANWU, Chelci [place] A on: 02/01/2017 10:26 AM   Modules accepted: Orders

## 2017-02-01 NOTE — Patient Instructions (Signed)
Return to clinic for any scheduled appointments or obstetric concerns, or go to [facility] for evaluation   Hypertension During Pregnancy Hypertension, commonly called high blood pressure, is when the force of blood pumping through your arteries is too strong. Arteries are blood vessels that carry blood from the heart throughout the body. Hypertension during pregnancy can cause problems for you and your baby. Your baby may be born early (prematurely) or may not weigh as much as he or she should at birth. Very bad cases of hypertension during pregnancy can be life-threatening. Different types of hypertension can occur during pregnancy. These include:  Chronic hypertension. This happens when:  You have hypertension before pregnancy and it continues during pregnancy.  You develop hypertension before you are [redacted] weeks pregnant, and it continues during pregnancy.  Gestational hypertension. This is hypertension that develops after the 20th week of pregnancy.  Preeclampsia, also called toxemia of pregnancy. This is a very serious type of hypertension that develops only during pregnancy. It affects the whole body, and it can be very dangerous for you and your baby. Gestational hypertension and preeclampsia usually go away within 6 weeks after your baby is born. Women who have hypertension during pregnancy have a greater chance of developing hypertension later in life or during future pregnancies. What are the causes? The exact cause of hypertension is not known. What increases the risk? There are certain factors that make it more likely for you to develop hypertension during pregnancy. These include:  Having hypertension during a previous pregnancy or prior to pregnancy.  Being overweight.  Being older than age 40.  Being pregnant for the first time or being pregnant with more than one baby.  Becoming pregnant using fertilization methods such as IVF (in vitro fertilization).  Having diabetes, kidney  problems, or systemic lupus erythematosus.  Having a family history of hypertension. What are the signs or symptoms? Chronic hypertension and gestational hypertension rarely cause symptoms. Preeclampsia causes symptoms, which may include:  Increased protein in your urine. Your health care provider will check for this at every visit before you give birth (prenatal visit).  Severe headaches.  Sudden weight gain.  Swelling of the hands, face, legs, and feet.  Nausea and vomiting.  Vision problems, such as blurred or double vision.  Numbness in the face, arms, legs, and feet.  Dizziness.  Slurred speech.  Sensitivity to bright lights.  Abdominal pain.  Convulsions. How is this diagnosed? You may be diagnosed with hypertension during a routine prenatal exam. At each prenatal visit, you may:  Have a urine test to check for high amounts of protein in your urine.  Have your blood pressure checked. A blood pressure reading is recorded as two numbers, such as "120 over 80" (or 120/80). The first ("top") number is called the systolic pressure. It is a measure of the pressure in your arteries when your heart beats. The second ("bottom") number is called the diastolic pressure. It is a measure of the pressure in your arteries as your heart relaxes between beats. Blood pressure is measured in a unit called mm Hg. A normal blood pressure reading is:  Systolic: below 120.  Diastolic: below 80. The type of hypertension that you are diagnosed with depends on your test results and when your symptoms developed.  Chronic hypertension is usually diagnosed before 20 weeks of pregnancy.  Gestational hypertension is usually diagnosed after 20 weeks of pregnancy.  Hypertension with high amounts of protein in the urine is diagnosed as preeclampsia.    Blood pressure measurements that stay above 160 systolic, or above 110 diastolic, are signs of severe preeclampsia. How is this treated? Treatment  for hypertension during pregnancy varies depending on the type of hypertension you have and how serious it is.  If you take medicines called ACE inhibitors to treat chronic hypertension, you may need to switch medicines. ACE inhibitors should not be taken during pregnancy.  If you have gestational hypertension, you may need to take blood pressure medicine.  If you are at risk for preeclampsia, your health care provider may recommend that you take a low-dose aspirin every day to prevent high blood pressure during your pregnancy.  If you have severe preeclampsia, you may need to be hospitalized so you and your baby can be monitored closely. You may also need to take medicine (magnesium sulfate) to prevent seizures and to lower blood pressure. This medicine may be given as an injection or through an IV tube.  In some cases, if your condition gets worse, you may need to deliver your baby early. Follow these instructions at home: Eating and drinking  Drink enough fluid to keep your urine clear or pale yellow.  Eat a healthy diet that is low in salt (sodium). Do not add salt to your food. Check food labels to see how much sodium a food or beverage contains. Lifestyle  Do not use any products that contain nicotine or tobacco, such as cigarettes and e-cigarettes. If you need help quitting, ask your health care provider.  Do not use alcohol.  Avoid caffeine.  Avoid stress as much as possible. Rest and get plenty of sleep. General instructions  Take over-the-counter and prescription medicines only as told by your health care provider.  While lying down, lie on your left side. This keeps pressure off your baby.  While sitting or lying down, raise (elevate) your feet. Try putting some pillows under your lower legs.  Exercise regularly. Ask your health care provider what kinds of exercise are best for you.  Keep all prenatal and follow-up visits as told by your health care provider. This is  important. Contact a health care provider if:  You have symptoms that your health care provider told you may require more treatment or monitoring, such as:  Fever.  Vomiting.  Headache. Get help right away if:  You have severe abdominal pain or vomiting that does not get better with treatment.  You suddenly develop swelling in your hands, ankles, or face.  You gain 4 lbs (1.8 kg) or more in 1 week.  You develop vaginal bleeding, or you have blood in your urine.  You do not feel your baby moving as much as usual.  You have blurred or double vision.  You have muscle twitching or sudden tightening (spasms).  You have shortness of breath.  Your lips or fingernails turn blue. This information is not intended to replace advice given to you by your health care provider. Make sure you discuss any questions you have with your health care provider. Document Released: 08/01/2011 Document Revised: 06/02/2016 Document Reviewed: 04/28/2016 Elsevier Interactive Patient Education  2017 [employer]  

## 2017-02-01 NOTE — Progress Notes (Addendum)
   PRENATAL VISIT NOTE  Subjective:  Sarah Sutton is a 26 y.o. [id number] at 4626w0d being seen today for ongoing prenatal care.  She is currently monitored for the following issues for this high-risk pregnancy and has History of incompetent cervix, currently pregnant; History of preterm delivery, currently pregnant; Short interval between pregnancies affecting pregnancy, antepartum; Previous cesarean section complicating pregnancy; Supervision of high risk pregnancy, antepartum; Obesity complicating pregnancy; Pyelectasis of fetus on prenatal ultrasound; and McDonald cerclage present, antepartum on her problem list.  Patient reports no complaints.  Contractions: Not present. Vag. Bleeding: None.  Movement: Present. Denies leaking of fluid.   The following portions of the patient's history were reviewed and updated as appropriate: allergies, current medications, past family history, past medical history, past social history, past surgical history and problem list. Problem list updated.  Objective:   Vitals:   02/01/17 0933  BP: (!) 141/74  Pulse: 85  Weight: 230 lb (104.3 kg)    Fetal Status: Fetal Heart Rate (bpm): 153 Fundal Height: 24 cm Movement: Present     General:  Alert, oriented and cooperative. Patient is in no acute distress.  Skin: Skin is warm and dry. No rash noted.   Cardiovascular: Normal heart rate noted  Respiratory: Normal respiratory effort, no problems with respiration noted  Abdomen: Soft, gravid, appropriate for gestational age. Pain/Pressure: Absent     Pelvic:  Cervical exam deferred        Extremities: Normal range of motion.  Edema: None  Mental Status: Normal mood and affect. Normal behavior. Normal judgment and thought content.   Assessment and Plan:  Pregnancy: [id number] at 7426w0d  1. McDonald cerclage present, antepartum No problems.  2. History of preterm delivery, currently pregnant Continue weekly 17P  3. Elevated blood pressure complicating pregnancy  in second trimester, antepartum No symptoms. Will check labs today, recheck BP in one week. Preeclampsia precautions advised.  - CBC - Comprehensive metabolic panel - Protein/creatinine ratio, urine  4. Supervision of high risk pregnancy, antepartum Preterm labor symptoms and general obstetric precautions including but not limited to vaginal bleeding, contractions, leaking of fluid and fetal movement were reviewed in detail with the patient. Please refer to After Visit Summary for other counseling recommendations.  Return in about 1 week (around 02/08/2017) for 17P and BP check. 2 weeks - 17P/BP check 3 weeks - 17P  4 weeks - OB visit/17P.   Tereso NewcomerUgonna A Anyanwu, MD

## 2017-02-02 ENCOUNTER — Encounter (HOSPITAL_COMMUNITY): Payer: Self-pay

## 2017-02-02 ENCOUNTER — Other Ambulatory Visit (HOSPITAL_COMMUNITY): Payer: Self-pay | Admitting: *Deleted

## 2017-02-02 ENCOUNTER — Ambulatory Visit (HOSPITAL_COMMUNITY)
Admission: RE | Admit: 2017-02-02 | Discharge: 2017-02-02 | Disposition: A | Discharge status: Home / Self Care | Payer: 59 | Source: Ambulatory Visit | Attending: Obstetrics and Gynecology | Admitting: Obstetrics and Gynecology

## 2017-02-02 DIAGNOSIS — Z3A23 23 weeks gestation of pregnancy: Secondary | ICD-10-CM | POA: Insufficient documentation

## 2017-02-02 DIAGNOSIS — O09299 Supervision of pregnancy with other poor reproductive or obstetric history, unspecified trimester: Secondary | ICD-10-CM

## 2017-02-02 DIAGNOSIS — N883 Incompetence of cervix uteri: Secondary | ICD-10-CM

## 2017-02-02 DIAGNOSIS — O09292 Supervision of pregnancy with other poor reproductive or obstetric history, second trimester: Secondary | ICD-10-CM | POA: Diagnosis not present

## 2017-02-02 DIAGNOSIS — O34219 Maternal care for unspecified type scar from previous cesarean delivery: Secondary | ICD-10-CM | POA: Insufficient documentation

## 2017-02-02 DIAGNOSIS — O3432 Maternal care for cervical incompetence, second trimester: Secondary | ICD-10-CM | POA: Diagnosis not present

## 2017-02-02 DIAGNOSIS — O99212 Obesity complicating pregnancy, second trimester: Secondary | ICD-10-CM | POA: Insufficient documentation

## 2017-02-02 DIAGNOSIS — Z362 Encounter for other antenatal screening follow-up: Secondary | ICD-10-CM | POA: Diagnosis not present

## 2017-02-02 LAB — COMPREHENSIVE METABOLIC PANEL
A/G RATIO: 1.3 (ref 1.2–2.2)
ALT: 13 IU/L (ref 0–32)
AST: 13 IU/L (ref 0–40)
Albumin: 3.4 g/dL — ABNORMAL LOW (ref 3.5–5.5)
Alkaline Phosphatase: 70 IU/L (ref 39–117)
BUN / CREAT RATIO: 8 — AB (ref 9–23)
BUN: 3 mg/dL — ABNORMAL LOW (ref 6–20)
Bilirubin Total: <0.2 mg/dL (ref 0.0–1.2)
CALCIUM: 8.4 mg/dL — AB (ref 8.7–10.2)
CO2: 22 mmol/L (ref 18–29)
Chloride: 101 mmol/L (ref 96–106)
Creatinine, Ser: 0.4 mg/dL — ABNORMAL LOW (ref 0.57–1.00)
GFR calc Af Amer: 165 mL/min/{1.73_m2} (ref >59)
GFR, EST NON AFRICAN AMERICAN: 143 mL/min/{1.73_m2} (ref >59)
Globulin, Total: 2.7 g/dL (ref 1.5–4.5)
Glucose: 88 mg/dL (ref 65–99)
POTASSIUM: 3.7 mmol/L (ref 3.5–5.2)
SODIUM: 139 mmol/L (ref 134–144)
Total Protein: 6.1 g/dL (ref 6.0–8.5)

## 2017-02-02 LAB — CBC
HEMATOCRIT: 32.9 % — AB (ref 34.0–46.6)
Hemoglobin: 11.1 g/dL (ref 11.1–15.9)
MCH: 29.9 pg (ref 26.6–33.0)
MCHC: 33.7 g/dL (ref 31.5–35.7)
MCV: 89 fL (ref 79–97)
Platelets: 326 10*3/uL (ref 150–379)
RBC: 3.71 x10E6/uL — ABNORMAL LOW (ref 3.77–5.28)
RDW: 13.9 % (ref 12.3–15.4)
WBC: 8.9 10*3/uL (ref 3.4–10.8)

## 2017-02-02 LAB — PROTEIN / CREATININE RATIO, URINE
Creatinine, Urine: 68.8 mg/dL
PROTEIN UR: 10.9 mg/dL
Protein/Creat Ratio: 158 mg/g creat (ref 0–200)

## 2017-02-03 DIAGNOSIS — O2692 Pregnancy related conditions, unspecified, second trimester: Secondary | ICD-10-CM | POA: Diagnosis not present

## 2017-02-03 DIAGNOSIS — Z3A23 23 weeks gestation of pregnancy: Secondary | ICD-10-CM | POA: Diagnosis not present

## 2017-02-15 ENCOUNTER — Ambulatory Visit: Payer: 59

## 2017-02-15 DIAGNOSIS — O0990 Supervision of high risk pregnancy, unspecified, unspecified trimester: Secondary | ICD-10-CM

## 2017-02-15 NOTE — Progress Notes (Signed)
Patient Presents for Bp Check and 17P Injection.  Bp 134/74  Pulse 86 Patient denies headaches or dizziness. No swelling in ankles or hands. Patient is ok to go per Dr. Debroah LoopArnold.  17P Injection given in LUOQ. Tolerated well.  Administrations This Visit    hydroxyprogesterone caproate (MAKENA) 250 mg/mL injection 250 mg    Admin Date 02/15/2017 Action Given Dose 250 mg Route Intramuscular Administered By Maretta Beesarol J Rendy Lazard, RMA

## 2017-02-15 NOTE — Addendum Note (Signed)
Addended by: Maretta BeesMCGLASHAN, Andreina Outten J on: 02/15/2017 10:44 AM   Modules accepted: Level of Service

## 2017-02-16 ENCOUNTER — Other Ambulatory Visit (HOSPITAL_COMMUNITY): Payer: Self-pay | Admitting: Obstetrics and Gynecology

## 2017-02-16 ENCOUNTER — Ambulatory Visit (HOSPITAL_COMMUNITY)
Admission: RE | Admit: 2017-02-16 | Discharge: 2017-02-16 | Disposition: A | Discharge status: Home / Self Care | Payer: 59 | Source: Ambulatory Visit | Attending: Obstetrics and Gynecology | Admitting: Obstetrics and Gynecology

## 2017-02-16 ENCOUNTER — Encounter (HOSPITAL_COMMUNITY): Payer: Self-pay

## 2017-02-16 DIAGNOSIS — O99212 Obesity complicating pregnancy, second trimester: Secondary | ICD-10-CM | POA: Insufficient documentation

## 2017-02-16 DIAGNOSIS — O34219 Maternal care for unspecified type scar from previous cesarean delivery: Secondary | ICD-10-CM

## 2017-02-16 DIAGNOSIS — O09219 Supervision of pregnancy with history of pre-term labor, unspecified trimester: Secondary | ICD-10-CM

## 2017-02-16 DIAGNOSIS — O3432 Maternal care for cervical incompetence, second trimester: Secondary | ICD-10-CM | POA: Insufficient documentation

## 2017-02-16 DIAGNOSIS — Z362 Encounter for other antenatal screening follow-up: Secondary | ICD-10-CM

## 2017-02-16 DIAGNOSIS — O34211 Maternal care for low transverse scar from previous cesarean delivery: Secondary | ICD-10-CM | POA: Insufficient documentation

## 2017-02-16 DIAGNOSIS — Z3A25 25 weeks gestation of pregnancy: Secondary | ICD-10-CM | POA: Diagnosis not present

## 2017-02-16 DIAGNOSIS — N883 Incompetence of cervix uteri: Secondary | ICD-10-CM

## 2017-02-16 DIAGNOSIS — O09212 Supervision of pregnancy with history of pre-term labor, second trimester: Secondary | ICD-10-CM | POA: Diagnosis not present

## 2017-02-16 DIAGNOSIS — Z3686 Encounter for antenatal screening for cervical length: Secondary | ICD-10-CM | POA: Insufficient documentation

## 2017-02-22 ENCOUNTER — Ambulatory Visit (INDEPENDENT_AMBULATORY_CARE_PROVIDER_SITE_OTHER): Payer: 59

## 2017-02-22 VITALS — BP 123/64 | HR 87

## 2017-02-22 DIAGNOSIS — O09212 Supervision of pregnancy with history of pre-term labor, second trimester: Secondary | ICD-10-CM | POA: Diagnosis not present

## 2017-02-22 DIAGNOSIS — O09899 Supervision of other high risk pregnancies, unspecified trimester: Secondary | ICD-10-CM

## 2017-02-22 DIAGNOSIS — O09219 Supervision of pregnancy with history of pre-term labor, unspecified trimester: Principal | ICD-10-CM

## 2017-02-22 NOTE — Progress Notes (Signed)
Patient present for 17P. Given in RUOQ. Tolerated well. Administrations This Visit    hydroxyprogesterone caproate (MAKENA) 250 mg/mL injection 250 mg    Admin Date 02/22/2017 Action Given Dose 250 mg Route Intramuscular Administered By Maretta Beesarol J Ankita Newcomer, RMA          BP 123/64  Pulse 87

## 2017-03-01 ENCOUNTER — Other Ambulatory Visit (HOSPITAL_COMMUNITY)
Admission: RE | Admit: 2017-03-01 | Discharge: 2017-03-01 | Disposition: A | Discharge status: Home / Self Care | Payer: 59 | Source: Ambulatory Visit | Attending: Obstetrics & Gynecology | Admitting: Obstetrics & Gynecology

## 2017-03-01 ENCOUNTER — Encounter: Payer: Self-pay | Admitting: Obstetrics & Gynecology

## 2017-03-01 ENCOUNTER — Ambulatory Visit (INDEPENDENT_AMBULATORY_CARE_PROVIDER_SITE_OTHER): Payer: 59 | Admitting: Obstetrics & Gynecology

## 2017-03-01 VITALS — BP 131/73 | HR 87 | Wt 226.4 lb

## 2017-03-01 DIAGNOSIS — O0990 Supervision of high risk pregnancy, unspecified, unspecified trimester: Secondary | ICD-10-CM | POA: Diagnosis not present

## 2017-03-01 DIAGNOSIS — O09292 Supervision of pregnancy with other poor reproductive or obstetric history, second trimester: Secondary | ICD-10-CM | POA: Diagnosis not present

## 2017-03-01 DIAGNOSIS — O34219 Maternal care for unspecified type scar from previous cesarean delivery: Secondary | ICD-10-CM

## 2017-03-01 DIAGNOSIS — O09299 Supervision of pregnancy with other poor reproductive or obstetric history, unspecified trimester: Secondary | ICD-10-CM

## 2017-03-01 NOTE — Progress Notes (Signed)
Pt presents for ROB without c/o. 17p given L upper outer quad today w/o difficulty.

## 2017-03-01 NOTE — Progress Notes (Signed)
Asks about ability to continue work   PRENATAL VISIT NOTE  Subjective:  Sarah Sutton is a 26 y.o. [id number] at [redacted]w[redacted]d being seen today for ongoing prenatal care.  She is currently monitored for the following issues for this high-risk pregnancy and has History of incompetent cervix, currently pregnant; History of preterm delivery, currently pregnant; Short interval between pregnancies affecting pregnancy, antepartum; Previous cesarean section complicating pregnancy; Supervision of high risk pregnancy, antepartum; Obesity complicating pregnancy; Pyelectasis of fetus on prenatal ultrasound; and McDonald cerclage present, antepartum on her problem list.  Patient reports pressure and vaginal discharge.  Contractions: Not present. Vag. Bleeding: None.  Movement: Present. Denies leaking of fluid.   The following portions of the patient's history were reviewed and updated as appropriate: allergies, current medications, past family history, past medical history, past social history, past surgical history and problem list. Problem list updated.  Objective:   Vitals:   03/01/17 0933  BP: 131/73  Pulse: 87  Weight: 226 lb 6.4 oz (102.7 kg)    Fetal Status: Fetal Heart Rate (bpm): 140   Movement: Present     General:  Alert, oriented and cooperative. Patient is in no acute distress.  Skin: Skin is warm and dry. No rash noted.   Cardiovascular: Normal heart rate noted  Respiratory: Normal respiratory effort, no problems with respiration noted  Abdomen: Soft, gravid, appropriate for gestational age. Pain/Pressure: Present     Pelvic:  Cervical exam performed        Extremities: Normal range of motion.  Edema: None  Mental Status: Normal mood and affect. Normal behavior. Normal judgment and thought content.   Assessment and Plan:  Pregnancy: [id number] at [redacted]w[redacted]d  1. Previous cesarean section complicating pregnancy Desires TOLAC  2. History of incompetent cervix, currently pregnant Cerclage intact on  exam and no evidence of ROM  3. Supervision of high risk pregnancy, antepartum May continue to work for now  Preterm labor symptoms and general obstetric precautions including but not limited to vaginal bleeding, contractions, leaking of fluid and fetal movement were reviewed in detail with the patient. Please refer to After Visit Summary for other counseling recommendations.  Return in about 1 week (around 03/08/2017).   Adam Phenix, MD

## 2017-03-01 NOTE — Patient Instructions (Signed)
Vaginal Birth After Cesarean Delivery Vaginal birth after cesarean delivery (VBAC) is giving birth vaginally after previously delivering a baby by a cesarean. In the past, if a woman had a cesarean delivery, all births afterward would be done by cesarean delivery. This is no longer true. It can be safe for the mother to try a vaginal delivery after having a cesarean delivery. It is important to discuss VBAC with your health care provider early in the pregnancy so you can understand the risks, benefits, and options. It will give you time to decide what is best in your particular case. The final decision about whether to have a VBAC or repeat cesarean delivery should be between you and your health care provider. Any changes in your health or your baby's health during your pregnancy may make it necessary to change your initial decision about VBAC. Women who plan to have a VBAC should check with their health care provider to be sure that:  The previous cesarean delivery was done with a low transverse uterine cut (incision) (not a vertical classical incision).  The birth canal is big enough for the baby.  There were no other operations on the uterus.  An electronic fetal monitor (EFM) will be on at all times during labor.  An operating room will be available and ready in case an emergency cesarean delivery is needed.  A health care provider and surgical nursing staff will be available at all times during labor to be ready to do an emergency delivery cesarean if necessary.  An anesthesiologist will be present in case an emergency cesarean delivery is needed.  The nursery is prepared and has adequate personnel and necessary equipment available to care for the baby in case of an emergency cesarean delivery. Benefits of VBAC  Shorter stay in the hospital.  Avoidance of risks associated with cesarean delivery, such as:  Surgical complications, such as opening of the incision or hernia in the  incision.  Injury to other organs.  Fever. This can occur if an infection develops after surgery. It can also occur as a reaction to the medicine given to make you numb during the surgery.  Less blood loss and need for blood transfusions.  Lower risk of blood clots and infection.  Shorter recovery.  Decreased risk for having to remove the uterus (hysterectomy).  Decreased risk for the placenta to completely or partially cover the opening of the uterus (placenta previa) with a future pregnancy.  Decrease risk in future labor and delivery. Risks of a VBAC  Tearing (rupture) of the uterus. This is occurs in less than 1% of VBACs. The risk of this happening is higher if:  Steps are taken to begin the labor process (induce labor) or stimulate or strengthen contractions (augment labor).  Medicine is used to soften (ripen) the cervix.  Having to remove the uterus (hysterectomy) if it ruptures. VBAC should not be done if:  The previous cesarean delivery was done with a vertical (classical) or T-shaped incision or you do not know what kind of incision was made.  You had a ruptured uterus.  You have had certain types of surgery on your uterus, such as removal of uterine fibroids. Ask your health care provider about other types of surgeries that prevent you from having a VBAC.  You have certain medical or childbirth (obstetrical) problems.  There are problems with the baby.  You have had two previous cesarean deliveries and no vaginal deliveries. Other facts to know about VBAC:  It   is safe to have an epidural anesthetic with VBAC.  It is safe to turn the baby from a breech position (attempt an external cephalic version).  It is safe to try a VBAC with twins.  VBAC may not be successful if your baby weights 8.8 lb (4 kg) or more. However, weight predictions are not always accurate and should not be used alone to decide if VBAC is right for you.  There is an increased failure rate  if the time between the cesarean delivery and VBAC is less than 19 months.  Your health care provider may advise against a VBAC if you have preeclampsia (high blood pressure, protein in the urine, and swelling of face and extremities).  VBAC is often successful if you previously gave birth vaginally.  VBAC is often successful when the labor starts spontaneously before the due date.  Delivering a baby through a VBAC is similar to having a normal spontaneous vaginal delivery. This information is not intended to replace advice given to you by your health care provider. Make sure you discuss any questions you have with your health care provider. Document Released: 05/06/2007 Document Revised: 04/20/2016 Document Reviewed: 06/12/2013 Elsevier Interactive Patient Education  2017 [employer] Inc.  

## 2017-03-02 ENCOUNTER — Encounter (HOSPITAL_COMMUNITY): Payer: Self-pay

## 2017-03-02 ENCOUNTER — Ambulatory Visit (HOSPITAL_COMMUNITY)
Admission: RE | Admit: 2017-03-02 | Discharge: 2017-03-02 | Disposition: A | Discharge status: Home / Self Care | Payer: 59 | Source: Ambulatory Visit | Attending: Obstetrics and Gynecology | Admitting: Obstetrics and Gynecology

## 2017-03-02 ENCOUNTER — Other Ambulatory Visit (HOSPITAL_COMMUNITY): Payer: Self-pay | Admitting: *Deleted

## 2017-03-02 ENCOUNTER — Other Ambulatory Visit (HOSPITAL_COMMUNITY): Payer: Self-pay | Admitting: Obstetrics and Gynecology

## 2017-03-02 DIAGNOSIS — O34219 Maternal care for unspecified type scar from previous cesarean delivery: Secondary | ICD-10-CM | POA: Insufficient documentation

## 2017-03-02 DIAGNOSIS — O09212 Supervision of pregnancy with history of pre-term labor, second trimester: Secondary | ICD-10-CM | POA: Insufficient documentation

## 2017-03-02 DIAGNOSIS — N883 Incompetence of cervix uteri: Secondary | ICD-10-CM

## 2017-03-02 DIAGNOSIS — O3432 Maternal care for cervical incompetence, second trimester: Secondary | ICD-10-CM

## 2017-03-02 DIAGNOSIS — O99212 Obesity complicating pregnancy, second trimester: Secondary | ICD-10-CM | POA: Insufficient documentation

## 2017-03-02 DIAGNOSIS — Z3A27 27 weeks gestation of pregnancy: Secondary | ICD-10-CM | POA: Insufficient documentation

## 2017-03-02 DIAGNOSIS — Z3686 Encounter for antenatal screening for cervical length: Secondary | ICD-10-CM | POA: Diagnosis not present

## 2017-03-02 LAB — CERVICOVAGINAL ANCILLARY ONLY
Bacterial vaginitis: NEGATIVE
Candida vaginitis: NEGATIVE
Chlamydia: NEGATIVE
NEISSERIA GONORRHEA: NEGATIVE
Trichomonas: NEGATIVE

## 2017-03-08 ENCOUNTER — Ambulatory Visit (INDEPENDENT_AMBULATORY_CARE_PROVIDER_SITE_OTHER): Payer: 59 | Admitting: Obstetrics & Gynecology

## 2017-03-08 ENCOUNTER — Encounter: Payer: Self-pay | Admitting: *Deleted

## 2017-03-08 VITALS — BP 127/78 | HR 101 | Wt 230.0 lb

## 2017-03-08 DIAGNOSIS — O09293 Supervision of pregnancy with other poor reproductive or obstetric history, third trimester: Secondary | ICD-10-CM

## 2017-03-08 DIAGNOSIS — O09213 Supervision of pregnancy with history of pre-term labor, third trimester: Secondary | ICD-10-CM

## 2017-03-08 DIAGNOSIS — O34219 Maternal care for unspecified type scar from previous cesarean delivery: Secondary | ICD-10-CM

## 2017-03-08 DIAGNOSIS — O09299 Supervision of pregnancy with other poor reproductive or obstetric history, unspecified trimester: Secondary | ICD-10-CM

## 2017-03-08 DIAGNOSIS — O3433 Maternal care for cervical incompetence, third trimester: Secondary | ICD-10-CM

## 2017-03-08 DIAGNOSIS — O3430 Maternal care for cervical incompetence, unspecified trimester: Secondary | ICD-10-CM

## 2017-03-08 DIAGNOSIS — O0990 Supervision of high risk pregnancy, unspecified, unspecified trimester: Secondary | ICD-10-CM

## 2017-03-08 DIAGNOSIS — O283 Abnormal ultrasonic finding on antenatal screening of mother: Secondary | ICD-10-CM

## 2017-03-08 DIAGNOSIS — O09219 Supervision of pregnancy with history of pre-term labor, unspecified trimester: Principal | ICD-10-CM

## 2017-03-08 DIAGNOSIS — O35EXX0 Maternal care for other (suspected) fetal abnormality and damage, fetal genitourinary anomalies, not applicable or unspecified: Secondary | ICD-10-CM

## 2017-03-08 DIAGNOSIS — O09899 Supervision of other high risk pregnancies, unspecified trimester: Secondary | ICD-10-CM

## 2017-03-08 DIAGNOSIS — O358XX0 Maternal care for other (suspected) fetal abnormality and damage, not applicable or unspecified: Secondary | ICD-10-CM

## 2017-03-08 NOTE — Progress Notes (Signed)
Patient reports good fetal movement, denies pain. Administered 17p and pt tolerated well. 

## 2017-03-08 NOTE — Progress Notes (Signed)
   PRENATAL VISIT NOTE  Subjective:  Sarah Sutton is a 26 y.o. [id number] at [redacted]w[redacted]d being seen today for ongoing prenatal care.  She is currently monitored for the following issues for this high-risk pregnancy and has History of incompetent cervix, currently pregnant; History of preterm delivery, currently pregnant; Short interval between pregnancies affecting pregnancy, antepartum; Previous cesarean section complicating pregnancy; Supervision of high risk pregnancy, antepartum; Obesity complicating pregnancy; Pyelectasis of fetus on prenatal ultrasound; and McDonald cerclage present, antepartum on her problem list.  Patient reports no complaints.  Contractions: Not present. Vag. Bleeding: None.  Movement: Present. Denies leaking of fluid.   The following portions of the patient's history were reviewed and updated as appropriate: allergies, current medications, past family history, past medical history, past social history, past surgical history and problem list. Problem list updated.  Objective:   Vitals:   03/08/17 1529  BP: 127/78  Pulse: (!) 101  Weight: 230 lb (104.3 kg)    Fetal Status: Fetal Heart Rate (bpm): 156 Fundal Height: 29 cm Movement: Present     General:  Alert, oriented and cooperative. Patient is in no acute distress.  Skin: Skin is warm and dry. No rash noted.   Cardiovascular: Normal heart rate noted  Respiratory: Normal respiratory effort, no problems with respiration noted  Abdomen: Soft, gravid, appropriate for gestational age. Pain/Pressure: Absent     Pelvic:  Cervical exam deferred        Extremities: Normal range of motion.  Edema: None  Mental Status: Normal mood and affect. Normal behavior. Normal judgment and thought content.   Assessment and Plan:  Pregnancy: [id number] at [redacted]w[redacted]d  1. History of preterm delivery, currently pregnant 2. McDonald cerclage present, antepartum 3. History of incompetent cervix, currently pregnant Stable cervical length. Continue  17P weekly.   4. Previous cesarean section complicating pregnancy Counseled regarding TOLAC vs RCS; risks/benefits discussed in detail. All questions answered.  Patient elects for TOLAC, consent signed 03/08/2017.  5. Pyelectasis of fetus on prenatal ultrasound Follow up subsequent scans; UTD A1.  6. Supervision of high risk pregnancy, antepartum Preterm labor symptoms and general obstetric precautions including but not limited to vaginal bleeding, contractions, leaking of fluid and fetal movement were reviewed in detail with the patient. Please refer to After Visit Summary for other counseling recommendations.  Return in about 1 week (around 03/15/2017) for 17P. 2 weeks: OB visit and 17P.   Tereso Newcomer, MD

## 2017-03-08 NOTE — Patient Instructions (Signed)
Return to clinic for any scheduled appointments or obstetric concerns, or go to [facility] for evaluation  

## 2017-03-15 ENCOUNTER — Other Ambulatory Visit: Payer: 59

## 2017-03-15 ENCOUNTER — Ambulatory Visit (INDEPENDENT_AMBULATORY_CARE_PROVIDER_SITE_OTHER): Payer: 59 | Admitting: *Deleted

## 2017-03-15 VITALS — BP 135/77 | HR 101 | Wt 225.0 lb

## 2017-03-15 DIAGNOSIS — O0990 Supervision of high risk pregnancy, unspecified, unspecified trimester: Secondary | ICD-10-CM | POA: Diagnosis not present

## 2017-03-15 DIAGNOSIS — O09219 Supervision of pregnancy with history of pre-term labor, unspecified trimester: Principal | ICD-10-CM

## 2017-03-15 DIAGNOSIS — O09213 Supervision of pregnancy with history of pre-term labor, third trimester: Secondary | ICD-10-CM | POA: Diagnosis not present

## 2017-03-15 DIAGNOSIS — O09899 Supervision of other high risk pregnancies, unspecified trimester: Secondary | ICD-10-CM

## 2017-03-15 NOTE — Addendum Note (Signed)
Addended by: Natale Milch D on: 03/15/2017 11:12 AM   Modules accepted: Orders

## 2017-03-15 NOTE — Progress Notes (Signed)
Patient in office for Makena injection and 2 hour Glucola

## 2017-03-20 ENCOUNTER — Observation Stay
Admission: EM | Admit: 2017-03-20 | Discharge: 2017-03-21 | Disposition: A | Discharge status: Home / Self Care | Payer: 59 | Attending: Obstetrics & Gynecology | Admitting: Obstetrics & Gynecology

## 2017-03-20 DIAGNOSIS — O358XX0 Maternal care for other (suspected) fetal abnormality and damage, not applicable or unspecified: Secondary | ICD-10-CM

## 2017-03-20 DIAGNOSIS — Z3A29 29 weeks gestation of pregnancy: Secondary | ICD-10-CM | POA: Insufficient documentation

## 2017-03-20 DIAGNOSIS — O09299 Supervision of pregnancy with other poor reproductive or obstetric history, unspecified trimester: Secondary | ICD-10-CM

## 2017-03-20 DIAGNOSIS — O0990 Supervision of high risk pregnancy, unspecified, unspecified trimester: Secondary | ICD-10-CM

## 2017-03-20 DIAGNOSIS — O09219 Supervision of pregnancy with history of pre-term labor, unspecified trimester: Secondary | ICD-10-CM

## 2017-03-20 DIAGNOSIS — Z3490 Encounter for supervision of normal pregnancy, unspecified, unspecified trimester: Secondary | ICD-10-CM

## 2017-03-20 DIAGNOSIS — O09899 Supervision of other high risk pregnancies, unspecified trimester: Secondary | ICD-10-CM

## 2017-03-20 DIAGNOSIS — O35EXX0 Maternal care for other (suspected) fetal abnormality and damage, fetal genitourinary anomalies, not applicable or unspecified: Secondary | ICD-10-CM

## 2017-03-20 DIAGNOSIS — O99211 Obesity complicating pregnancy, first trimester: Secondary | ICD-10-CM

## 2017-03-20 DIAGNOSIS — O4703 False labor before 37 completed weeks of gestation, third trimester: Principal | ICD-10-CM | POA: Insufficient documentation

## 2017-03-20 DIAGNOSIS — O34219 Maternal care for unspecified type scar from previous cesarean delivery: Secondary | ICD-10-CM

## 2017-03-20 NOTE — OB Triage Note (Signed)
Patient arrived in triage with c/o contractions on and off for the last week. States she was at work and feeling an intermittent "tightening and balling up", so she left work to be seen here.  These feelings have subsided, but she is still having intermittent sharp lower abdominal pains.  Patient states she has been leaking fluid for "a couple weeks" and that this fluid has been "checked out" and was not amniotic fluid.  Patient also states she has some spotting when wiping that has been going on over a week. Patient has been seen in the office by provider since these occurrences have started.  EFM applied. Discussed plan of care with patient. Patient verbalized understanding and agreed to plan.

## 2017-03-21 ENCOUNTER — Other Ambulatory Visit: Payer: Self-pay | Admitting: Certified Nurse Midwife

## 2017-03-21 DIAGNOSIS — Z3490 Encounter for supervision of normal pregnancy, unspecified, unspecified trimester: Secondary | ICD-10-CM

## 2017-03-21 DIAGNOSIS — O4703 False labor before 37 completed weeks of gestation, third trimester: Secondary | ICD-10-CM | POA: Diagnosis not present

## 2017-03-21 DIAGNOSIS — Z3A29 29 weeks gestation of pregnancy: Secondary | ICD-10-CM | POA: Diagnosis not present

## 2017-03-21 LAB — CBC
HEMATOCRIT: 33.2 % — AB (ref 34.0–46.6)
HEMOGLOBIN: 11 g/dL — AB (ref 11.1–15.9)
MCH: 29.4 pg (ref 26.6–33.0)
MCHC: 33.1 g/dL (ref 31.5–35.7)
MCV: 89 fL (ref 79–97)
PLATELETS: 310 10*3/uL (ref 150–379)
RBC: 3.74 x10E6/uL — AB (ref 3.77–5.28)
RDW: 14 % (ref 12.3–15.4)
WBC: 9.4 10*3/uL (ref 3.4–10.8)

## 2017-03-21 LAB — GLUCOSE TOLERANCE, 2 HOURS W/ 1HR: Glucose, Fasting: 88 mg/dL (ref 65–91)

## 2017-03-21 LAB — RPR: RPR Ser Ql: NONREACTIVE

## 2017-03-21 LAB — HIV ANTIBODY (ROUTINE TESTING W REFLEX): HIV SCREEN 4TH GENERATION: NONREACTIVE

## 2017-03-21 NOTE — Progress Notes (Signed)
PO hydration encouraged and patient given pitcher of iced water. Patient states that she is feeling better and  "things have calmed down" since she got here. Discussed plan of care with patient to observe for 1 hour and discharge home if she is still feeling better. Patient verbalized understanding and agreed to plan.

## 2017-03-21 NOTE — OB Triage Note (Signed)
Patient given discharge instructions including preterm labor precautions, fetal kick count instructions, and follow up information. Patient verbalized understanding. Discharged home, ambulatory, in stable condition.

## 2017-03-21 NOTE — Progress Notes (Signed)
Patient resting in bed quietly, states "I dozed off".  Patient feels better and feels comfortable being discharged home at this time.

## 2017-03-22 ENCOUNTER — Ambulatory Visit (INDEPENDENT_AMBULATORY_CARE_PROVIDER_SITE_OTHER): Payer: 59 | Admitting: Obstetrics and Gynecology

## 2017-03-22 ENCOUNTER — Other Ambulatory Visit: Payer: Self-pay

## 2017-03-22 VITALS — BP 110/69 | HR 91 | Wt 223.6 lb

## 2017-03-22 DIAGNOSIS — O0993 Supervision of high risk pregnancy, unspecified, third trimester: Secondary | ICD-10-CM

## 2017-03-22 DIAGNOSIS — O35EXX0 Maternal care for other (suspected) fetal abnormality and damage, fetal genitourinary anomalies, not applicable or unspecified: Secondary | ICD-10-CM

## 2017-03-22 DIAGNOSIS — O358XX0 Maternal care for other (suspected) fetal abnormality and damage, not applicable or unspecified: Secondary | ICD-10-CM

## 2017-03-22 DIAGNOSIS — O09213 Supervision of pregnancy with history of pre-term labor, third trimester: Secondary | ICD-10-CM | POA: Diagnosis not present

## 2017-03-22 DIAGNOSIS — O0990 Supervision of high risk pregnancy, unspecified, unspecified trimester: Secondary | ICD-10-CM

## 2017-03-22 DIAGNOSIS — O3430 Maternal care for cervical incompetence, unspecified trimester: Secondary | ICD-10-CM

## 2017-03-22 DIAGNOSIS — O34219 Maternal care for unspecified type scar from previous cesarean delivery: Secondary | ICD-10-CM

## 2017-03-22 DIAGNOSIS — O09899 Supervision of other high risk pregnancies, unspecified trimester: Secondary | ICD-10-CM

## 2017-03-22 DIAGNOSIS — O09299 Supervision of pregnancy with other poor reproductive or obstetric history, unspecified trimester: Secondary | ICD-10-CM

## 2017-03-22 DIAGNOSIS — O09219 Supervision of pregnancy with history of pre-term labor, unspecified trimester: Secondary | ICD-10-CM

## 2017-03-22 DIAGNOSIS — O09293 Supervision of pregnancy with other poor reproductive or obstetric history, third trimester: Secondary | ICD-10-CM

## 2017-03-22 NOTE — Progress Notes (Signed)
Subjective:  Sarah Sutton is a 26 y.o. [id number] at [redacted]w[redacted]d being seen today for ongoing prenatal care.  She is currently monitored for the following issues for this high-risk pregnancy and has History of incompetent cervix, currently pregnant; History of preterm delivery, currently pregnant; Short interval between pregnancies affecting pregnancy, antepartum; Previous cesarean section complicating pregnancy; Supervision of high risk pregnancy, antepartum; Obesity complicating pregnancy; Pyelectasis of fetus on prenatal ultrasound; and McDonald cerclage present, antepartum on her problem list.  Patient reports had some spotting yesterday. Has resolved. Getting tired at work. Considering changing to light duty..  Contractions: Irregular. Vag. Bleeding: None.  Movement: Present. Denies leaking of fluid.   The following portions of the patient's history were reviewed and updated as appropriate: allergies, current medications, past family history, past medical history, past social history, past surgical history and problem list. Problem list updated.  Objective:   Vitals:   03/22/17 1554  BP: 110/69  Pulse: 91  Weight: 223 lb 9.6 oz (101.4 kg)    Fetal Status: Fetal Heart Rate (bpm): 155   Movement: Present     General:  Alert, oriented and cooperative. Patient is in no acute distress.  Skin: Skin is warm and dry. No rash noted.   Cardiovascular: Normal heart rate noted  Respiratory: Normal respiratory effort, no problems with respiration noted  Abdomen: Soft, gravid, appropriate for gestational age. Pain/Pressure: Absent     Pelvic:  Cervical exam deferred        Extremities: Normal range of motion.  Edema: None  Mental Status: Normal mood and affect. Normal behavior. Normal judgment and thought content.   Urinalysis:      Assessment and Plan:  Pregnancy: [id number] at [redacted]w[redacted]d  1. Supervision of high risk pregnancy, antepartum Pt to schedule glucola next week To check with HR about light duty at  work and bring any necessary forms to next appt  2. McDonald cerclage present, antepartum stable  3. History of incompetent cervix, currently pregnant Cerclage in place  4. History of preterm delivery, currently pregnant Continue with 17 OHP  5. Previous cesarean section complicating pregnancy TOLAC consent signed  6. Pyelectasis of fetus on prenatal ultrasound Stable F/U U/S in May  Preterm labor symptoms and general obstetric precautions including but not limited to vaginal bleeding, contractions, leaking of fluid and fetal movement were reviewed in detail with the patient. Please refer to After Visit Summary for other counseling recommendations.  Return in about 2 weeks (around 04/05/2017).   Hermina Staggers, MD

## 2017-03-22 NOTE — Progress Notes (Signed)
Patient reports good fetal movement with contractions that come and go. Pt states she had spotting a few days ago, but none since.

## 2017-03-22 NOTE — Progress Notes (Signed)
Pt left without completing GTT at last visit. Will need to be rescheduled.

## 2017-03-23 NOTE — Discharge Summary (Signed)
Sarah Sutton is a 26 y.o. female. She is at [redacted]w[redacted]d gestation. Patient's last menstrual period was 08/03/2016 (approximate). Estimated Date of Delivery: 05/31/17  Prenatal care site:  [facility]  Chief complaint: intermittent contractions - history of incompetent cervix with cerclage in place  Location: uterus Onset/timing: sudden today at work Duration: intermittently over a week Quality: cramping Severity: mild to moderate Aggravating or alleviating conditions: n/a Associated signs/symptoms: no VB.no LOF,  Active fetal movement. Context:  Patient was at work and felt some contractions with some low abd pain and came here rather than to her home hospital.  She has been seen in the office by provider since these occurrences have started.    Maternal Medical History:   Past Medical History:  Diagnosis Date  . Abnormal Pap smear 2007   COLPO DONE;LAST PAP 03/2011 WAS NORMAL  . Anemia    NO MEDS IN PAST  . Heart murmur    AS A CHILD, never caused any problems  . NSVD (normal spontaneous vaginal delivery) 09/30/2012   Preterm delivery at 34 weeks; IOL for PPROM  . Polycystic ovarian syndrome 2009   by Dr. Gaynell Face  . Preterm labor     Past Surgical History:  Procedure Laterality Date  . CERVICAL CERCLAGE  07/01/2012   Procedure: CERCLAGE CERVICAL;  Surgeon: Esmeralda Arthur, MD;  Location: [facility];  Service: Gynecology;  Laterality: N/A;  . CERVICAL CERCLAGE N/A 09/01/2015   Procedure: CERCLAGE CERVICAL;  Surgeon: Jaymes Graff, MD;  Location: [facility];  Service: Gynecology;  Laterality: N/A;  . CERVICAL CERCLAGE N/A 12/15/2015   Procedure: CERCLAGE CERVICAL removal;  Surgeon: Jaymes Graff, MD;  Location: [facility];  Service: Obstetrics;  Laterality: N/A;  . CERVICAL CERCLAGE N/A 11/21/2016   Procedure: CERCLAGE CERVICAL;  Surgeon: Willodean Rosenthal, MD;  Location: [facility];  Service: Gynecology;  Laterality: N/A;  . CESAREAN SECTION N/A 12/15/2015   Procedure:  CESAREAN SECTION;  Surgeon: Jaymes Graff, MD;  Location: [facility];  Service: Obstetrics;  Laterality: N/A;  . CHOLECYSTECTOMY  november 2009  . COLPOSCOPY  2009  . WISDOM TOOTH EXTRACTION     ALL 4 REMOVED    No Known Allergies  Prior to Admission medications   Medication Sig Start Date End Date Taking? Authorizing Provider  Prenatal Vit-Fe Fumarate-FA (PRENATAL MULTIVITAMIN) TABS tablet Take 1 tablet by mouth daily at 12 noon.    Yes Historical Provider, MD     Social History: She  reports that she quit smoking about 20 months ago. Her smoking use included Cigarettes. She has a 5.00 pack-year smoking history. She has never used smokeless tobacco. She reports that she uses drugs, including Marijuana. She reports that she does not drink alcohol.  Family History: family history includes Asthma in her mother; Cancer in her maternal grandmother and paternal grandmother; Diabetes in her father; Hypertension in her father.    Review of Systems: A full review of systems was performed and negative except as noted in the HPI.     O:  BP (!) 120/57 (BP Location: Left Arm)   Pulse 83   Temp 98.2 F (36.8 C) (Oral)   Resp 18   Ht  (1.702 m)   Wt 102.1 kg (225 lb)   LMP 08/03/2016 (Approximate)   BMI 35.24 kg/m  No results found for this or any previous visit (from the past 48 hour(s)).   Constitutional: NAD, AAOx3  HE/ENT: extraocular movements grossly intact, moist mucous membranes CV: RRR PULM:  nl respiratory effort, CTABL     Abd: gravid, non-tender, non-distended, soft      Ext: Non-tender, Nonedmeatous   Psych: mood appropriate, speech normal Pelvic closed   NST:  Baseline: 130 Variability: moderate Accelerations present x >2 Decelerations absent      A/P: 26 y.o. [redacted]w[redacted]d here for rule out preterm labor  Labor: not present.   Fetal Wellbeing: Reassuring Cat 1 tracing.  Reactive NST   D/c home stable, precautions reviewed, follow-up as scheduled at her routine  prenatal site.  ----- Ranae Plumber, MD Attending Obstetrician and Gynecologist [facility], Department of OB/GYN [facility]

## 2017-03-29 ENCOUNTER — Ambulatory Visit (INDEPENDENT_AMBULATORY_CARE_PROVIDER_SITE_OTHER): Payer: 59 | Admitting: *Deleted

## 2017-03-29 VITALS — BP 132/79 | HR 96 | Wt 226.6 lb

## 2017-03-29 DIAGNOSIS — O09212 Supervision of pregnancy with history of pre-term labor, second trimester: Secondary | ICD-10-CM

## 2017-03-29 DIAGNOSIS — O09899 Supervision of other high risk pregnancies, unspecified trimester: Secondary | ICD-10-CM

## 2017-03-29 DIAGNOSIS — O09219 Supervision of pregnancy with history of pre-term labor, unspecified trimester: Principal | ICD-10-CM

## 2017-03-29 NOTE — Progress Notes (Signed)
Patient is in office for her Makena injection.

## 2017-04-05 ENCOUNTER — Ambulatory Visit (INDEPENDENT_AMBULATORY_CARE_PROVIDER_SITE_OTHER): Payer: 59 | Admitting: Certified Nurse Midwife

## 2017-04-05 ENCOUNTER — Telehealth: Payer: Self-pay

## 2017-04-05 ENCOUNTER — Ambulatory Visit: Payer: Self-pay

## 2017-04-05 ENCOUNTER — Other Ambulatory Visit: Payer: Self-pay

## 2017-04-05 DIAGNOSIS — O09213 Supervision of pregnancy with history of pre-term labor, third trimester: Secondary | ICD-10-CM

## 2017-04-05 DIAGNOSIS — O34219 Maternal care for unspecified type scar from previous cesarean delivery: Secondary | ICD-10-CM

## 2017-04-05 DIAGNOSIS — O09219 Supervision of pregnancy with history of pre-term labor, unspecified trimester: Secondary | ICD-10-CM | POA: Diagnosis not present

## 2017-04-05 DIAGNOSIS — O99213 Obesity complicating pregnancy, third trimester: Secondary | ICD-10-CM

## 2017-04-05 DIAGNOSIS — Z9189 Other specified personal risk factors, not elsewhere classified: Secondary | ICD-10-CM

## 2017-04-05 DIAGNOSIS — O3430 Maternal care for cervical incompetence, unspecified trimester: Secondary | ICD-10-CM

## 2017-04-05 DIAGNOSIS — O0993 Supervision of high risk pregnancy, unspecified, third trimester: Secondary | ICD-10-CM

## 2017-04-05 DIAGNOSIS — O09299 Supervision of pregnancy with other poor reproductive or obstetric history, unspecified trimester: Secondary | ICD-10-CM

## 2017-04-05 DIAGNOSIS — O0990 Supervision of high risk pregnancy, unspecified, unspecified trimester: Secondary | ICD-10-CM

## 2017-04-05 DIAGNOSIS — O3433 Maternal care for cervical incompetence, third trimester: Secondary | ICD-10-CM

## 2017-04-05 DIAGNOSIS — E669 Obesity, unspecified: Secondary | ICD-10-CM

## 2017-04-05 DIAGNOSIS — O09899 Supervision of other high risk pregnancies, unspecified trimester: Secondary | ICD-10-CM

## 2017-04-05 DIAGNOSIS — O09293 Supervision of pregnancy with other poor reproductive or obstetric history, third trimester: Secondary | ICD-10-CM

## 2017-04-05 LAB — GLUCOSE, POCT (MANUAL RESULT ENTRY): POC Glucose: 79 mg/dl (ref 70–99)

## 2017-04-05 MED ORDER — HYDROXYPROGESTERONE CAPROATE 250 MG/ML IM OIL: 250.0000 mg | TOPICAL_OIL | Freq: Once | INTRAMUSCULAR | Status: DC

## 2017-04-05 NOTE — Progress Notes (Signed)
PRENATAL VISIT NOTE  Subjective:  Sarah Sutton is a 26 y.o. [id number] at [redacted]w[redacted]d being seen today for ongoing prenatal care.  She is currently monitored for the following issues for this high-risk pregnancy and has History of incompetent cervix, currently pregnant; History of preterm delivery, currently pregnant; Short interval between pregnancies affecting pregnancy, antepartum; Previous cesarean section complicating pregnancy; Supervision of high risk pregnancy, antepartum; Obesity complicating pregnancy; Pyelectasis of fetus on prenatal ultrasound; and McDonald cerclage present, antepartum on her problem list.    Sarah Sutton had discussed policy of blood sugar testing with patient.  Was scheduled on Dr. Thomes Lolling schedule, is agreeable to see him in the future.  Patient refused 2 hour OGTT, 1 hour OGTT offered and refused.  Discussed early morning appointments as she works nights.  Has two small children at home.  Last delivery was at 33 weeks via LTCS.  First pregnancy was delivered vaginally at 28 weeks.  Has multiple issues compounding this pregnancy.  Does not want to be pregnant.  Has two experiences with NICU.  Has fears over taking a newborn home, although does not want another NICU admission.  Is agreeable to 17-P injections.  Does want this child.  Is depressed: declines depression medications.  Encouragement to get counseling given.  Sarah Sutton SW present for most of the discussion about this pregnancy.  Declines any issues with front office staff.  Does know the providers.  Has limited social supports in place, ? [name] involvement.  Same [name] as other 2 children but has moved out.   Was a foster child growing up and relies on her self sufficiency.  Not currently sexually active.  Does have resources to take care of this child as well as other children.  SW in office aware of situation.    Patient reports no complaints.  Contractions: Irregular.  .  Movement: Present. Denies leaking of fluid.    The following portions of the patient's history were reviewed and updated as appropriate: allergies, current medications, past family history, past medical history, past social history, past surgical history and problem list. Problem list updated.  Objective:  There were no vitals filed for this visit.  Fetal Status: Fetal Heart Rate (bpm): 135 Fundal Height: 33 cm Movement: Present     General:  Alert, oriented and cooperative. Patient is in no acute distress.  Skin: Skin is warm and dry. No rash noted.   Cardiovascular: Normal heart rate noted  Respiratory: Normal respiratory effort, no problems with respiration noted  Abdomen: Soft, gravid, appropriate for gestational age. Pain/Pressure: Absent     Pelvic:  Cervical exam deferred        Extremities: Normal range of motion.     Mental Status: Normal mood and affect. Normal behavior. Normal judgment and thought content.  Depressed in emotion.  Flat affect.     Assessment and Plan:  Pregnancy: [id number] at [redacted]w[redacted]d  1. Supervision of high risk pregnancy, antepartum     Patient seen in conjunction with Dr. Clearance Coots.  Full social hx taken.  Patient needs/desires early morning appointments.  Has anxiety over multiple people in the waiting room and patient works at night at [employer].    2. History of preterm delivery, currently pregnant      Weekly 17-P, is willing to take them especially if she can get them in the arm.   - Hemoglobin A1c - POCT glucose (manual entry)  3. McDonald cerclage present, antepartum Pelvic rest  4. History of incompetent cervix,  currently pregnant     Has McDonald cerclage  5. Short interval between pregnancies affecting pregnancy, antepartum        6. Previous cesarean section complicating pregnancy     Desires TOLAC, needs to sign [facility]      7. Obesity affecting pregnancy in third trimester      Has lost 14 lbs this pregnancy.    Preterm labor symptoms and general obstetric precautions including  but not limited to vaginal bleeding, contractions, leaking of fluid and fetal movement were reviewed in detail with the patient. Please refer to After Visit Summary for other counseling recommendations.  Return in about 2 weeks (around 04/19/2017) [facility], weekly 17-P in upper arm and early morning appointments.   Roe Coombsenney, Marthe Dant A, CNM

## 2017-04-05 NOTE — Telephone Encounter (Signed)
Called left message to schedule appointments for patient.

## 2017-04-06 LAB — HEMOGLOBIN A1C
ESTIMATED AVERAGE GLUCOSE: 111 mg/dL
HEMOGLOBIN A1C: 5.5 % (ref 4.8–5.6)

## 2017-04-08 DIAGNOSIS — Z9189 Other specified personal risk factors, not elsewhere classified: Secondary | ICD-10-CM | POA: Insufficient documentation

## 2017-04-09 ENCOUNTER — Encounter: Payer: Self-pay | Admitting: Obstetrics and Gynecology

## 2017-04-09 ENCOUNTER — Other Ambulatory Visit: Payer: Self-pay

## 2017-04-12 ENCOUNTER — Ambulatory Visit (INDEPENDENT_AMBULATORY_CARE_PROVIDER_SITE_OTHER): Payer: 59 | Admitting: *Deleted

## 2017-04-12 VITALS — BP 129/75 | HR 96 | Wt 227.8 lb

## 2017-04-12 DIAGNOSIS — O09213 Supervision of pregnancy with history of pre-term labor, third trimester: Secondary | ICD-10-CM | POA: Diagnosis not present

## 2017-04-12 DIAGNOSIS — O09219 Supervision of pregnancy with history of pre-term labor, unspecified trimester: Principal | ICD-10-CM

## 2017-04-12 DIAGNOSIS — O09899 Supervision of other high risk pregnancies, unspecified trimester: Secondary | ICD-10-CM

## 2017-04-12 NOTE — Progress Notes (Signed)
Patient is in the office for her Makena injection. She prefers to get it in her Deltoid.

## 2017-04-13 ENCOUNTER — Encounter (HOSPITAL_COMMUNITY): Payer: Self-pay

## 2017-04-13 ENCOUNTER — Ambulatory Visit (HOSPITAL_COMMUNITY)
Admission: RE | Admit: 2017-04-13 | Discharge: 2017-04-13 | Disposition: A | Discharge status: Home / Self Care | Payer: 59 | Source: Ambulatory Visit | Attending: Obstetrics and Gynecology | Admitting: Obstetrics and Gynecology

## 2017-04-13 DIAGNOSIS — O99213 Obesity complicating pregnancy, third trimester: Secondary | ICD-10-CM | POA: Insufficient documentation

## 2017-04-13 DIAGNOSIS — O3433 Maternal care for cervical incompetence, third trimester: Secondary | ICD-10-CM | POA: Diagnosis not present

## 2017-04-13 DIAGNOSIS — Z3A33 33 weeks gestation of pregnancy: Secondary | ICD-10-CM | POA: Diagnosis not present

## 2017-04-13 DIAGNOSIS — Z0373 Encounter for suspected fetal anomaly ruled out: Secondary | ICD-10-CM | POA: Diagnosis not present

## 2017-04-13 DIAGNOSIS — O3432 Maternal care for cervical incompetence, second trimester: Secondary | ICD-10-CM

## 2017-04-17 ENCOUNTER — Other Ambulatory Visit: Payer: Self-pay | Admitting: Certified Nurse Midwife

## 2017-04-17 ENCOUNTER — Telehealth: Payer: Self-pay | Admitting: *Deleted

## 2017-04-17 DIAGNOSIS — H10023 Other mucopurulent conjunctivitis, bilateral: Secondary | ICD-10-CM

## 2017-04-17 MED ORDER — MOXIFLOXACIN HCL 0.5 % OP SOLN: 1.0000 [drp] | Freq: Three times a day (TID) | OPHTHALMIC | 0 refills | Status: DC

## 2017-04-17 NOTE — Telephone Encounter (Signed)
Patient states her son has pink eye and now she has it. What can she take- or can we send something in for her?

## 2017-04-17 NOTE — Telephone Encounter (Signed)
Please let her know that I have sent in Vigamox drops to the pharmacy for her to use.  If she has cone insurance as a side note it will be cheaper for her at one of the outpatient pharmacies.  If this is the case please send to one of those pharmacies for her.  Thank you. Demitrious Mccannon

## 2017-04-18 NOTE — Telephone Encounter (Signed)
Patient notified

## 2017-04-19 ENCOUNTER — Ambulatory Visit (INDEPENDENT_AMBULATORY_CARE_PROVIDER_SITE_OTHER): Payer: 59 | Admitting: Obstetrics & Gynecology

## 2017-04-19 ENCOUNTER — Encounter: Payer: Self-pay | Admitting: Obstetrics & Gynecology

## 2017-04-19 VITALS — BP 134/74 | HR 97 | Wt 225.3 lb

## 2017-04-19 DIAGNOSIS — O3433 Maternal care for cervical incompetence, third trimester: Secondary | ICD-10-CM

## 2017-04-19 DIAGNOSIS — O0993 Supervision of high risk pregnancy, unspecified, third trimester: Secondary | ICD-10-CM

## 2017-04-19 DIAGNOSIS — O0990 Supervision of high risk pregnancy, unspecified, unspecified trimester: Secondary | ICD-10-CM

## 2017-04-19 DIAGNOSIS — Z3A18 18 weeks gestation of pregnancy: Secondary | ICD-10-CM

## 2017-04-19 DIAGNOSIS — O3430 Maternal care for cervical incompetence, unspecified trimester: Secondary | ICD-10-CM

## 2017-04-19 MED ORDER — HYDROXYPROGESTERONE CAPROATE 250 MG/ML IM OIL: 250.0000 mg | TOPICAL_OIL | Freq: Once | INTRAMUSCULAR | Status: DC

## 2017-04-19 NOTE — Progress Notes (Signed)
Patient reports good fetal movement, denies pain/contractions. Patient states that she has pink eye that started 2 days ago, and she currently is using prescribed eye drops.

## 2017-04-19 NOTE — Progress Notes (Signed)
   PRENATAL VISIT NOTE  Subjective:  Sarah Sutton is a 26 y.o. [id number] at 2533w0d being seen today for ongoing prenatal care.  She is currently monitored for the following issues for this high-risk pregnancy and has History of incompetent cervix, currently pregnant; History of preterm delivery, currently pregnant; Short interval between pregnancies affecting pregnancy, antepartum; Previous cesarean section complicating pregnancy; Supervision of high risk pregnancy, antepartum; Obesity complicating pregnancy; Pyelectasis of fetus on prenatal ultrasound; McDonald cerclage present, antepartum; and At risk for postpartum depression on her problem list.  Patient reports conjunctivitis.  Contractions: Not present. Vag. Bleeding: None.  Movement: Present. Denies leaking of fluid.   The following portions of the patient's history were reviewed and updated as appropriate: allergies, current medications, past family history, past medical history, past social history, past surgical history and problem list. Problem list updated.  Objective:   Vitals:   04/19/17 1137  BP: 134/74  Pulse: 97  Weight: 225 lb 4.8 oz (102.2 kg)    Fetal Status:     Movement: Present     General:  Alert, oriented and cooperative. Patient is in no acute distress.  Skin: Skin is warm and dry. No rash noted.   Cardiovascular: Normal heart rate noted  Respiratory: Normal respiratory effort, no problems with respiration noted  Abdomen: Soft, gravid, appropriate for gestational age. Pain/Pressure: Absent     Pelvic:  Cervical exam deferred        Extremities: Normal range of motion.  Edema: Trace  Mental Status: Normal mood and affect. Normal behavior. Normal judgment and thought content.   Assessment and Plan:  Pregnancy: [id number] at 4533w0d  1. Supervision of high risk pregnancy, antepartum 17 P today  2. McDonald cerclage present, antepartum Remove in 2 weeks  Preterm labor symptoms and general obstetric precautions  including but not limited to vaginal bleeding, contractions, leaking of fluid and fetal movement were reviewed in detail with the patient. Please refer to After Visit Summary for other counseling recommendations.  Return in about 1 week (around 04/26/2017) for 17 p only 1 week, 2 weeks remove cerclage.   Scheryl DarterJames Aldous Housel, MD

## 2017-04-19 NOTE — Patient Instructions (Signed)
. °Preterm Labor and Birth Information °The normal length of a pregnancy is 39-41 weeks. Preterm labor is when labor starts before 37 completed weeks of pregnancy. °What are the risk factors for preterm labor? °Preterm labor is more likely to occur in women who: °· Have certain infections during pregnancy such as a bladder infection, sexually transmitted infection, or infection inside the uterus (chorioamnionitis). °· Have a shorter-than-normal cervix. °· Have gone into preterm labor before. °· Have had surgery on their cervix. °· Are younger than age 17 or older than age 35. °· Are African American. °· Are pregnant with twins or multiple babies (multiple gestation). °· Take street drugs or smoke while pregnant. °· Do not gain enough weight while pregnant. °· Became pregnant shortly after having been pregnant. °What are the symptoms of preterm labor? °Symptoms of preterm labor include: °· Cramps similar to those that can happen during a menstrual period. The cramps may happen with diarrhea. °· Pain in the abdomen or lower back. °· Regular uterine contractions that may feel like tightening of the abdomen. °· A feeling of increased pressure in the pelvis. °· Increased watery or bloody mucus discharge from the vagina. °· Water breaking (ruptured amniotic sac). °Why is it important to recognize signs of preterm labor? °It is important to recognize signs of preterm labor because babies who are born prematurely may not be fully developed. This can put them at an increased risk for: °· Long-term (chronic) heart and lung problems. °· Difficulty immediately after birth with regulating body systems, including blood sugar, body temperature, heart rate, and breathing rate. °· Bleeding in the brain. °· Cerebral palsy. °· Learning difficulties. °· Death. °These risks are highest for babies who are born before 34 weeks of pregnancy. °How is preterm labor treated? °Treatment depends on the length of your pregnancy, your condition,  and the health of your baby. It may involve: °· Having a stitch (suture) placed in your cervix to prevent your cervix from opening too early (cerclage). °· Taking or being given medicines, such as: °¨ Hormone medicines. These may be given early in pregnancy to help support the pregnancy. °¨ Medicine to stop contractions. °¨ Medicines to help mature the baby’s lungs. These may be prescribed if the risk of delivery is high. °¨ Medicines to prevent your baby from developing cerebral palsy. °If the labor happens before 34 weeks of pregnancy, you may need to stay in the hospital. °What should I do if I think I am in preterm labor? °If you think that you are going into preterm labor, call your health care provider right away. °How can I prevent preterm labor in future pregnancies? °To increase your chance of having a full-term pregnancy: °· Do not use any tobacco products, such as cigarettes, chewing tobacco, and e-cigarettes. If you need help quitting, ask your health care provider. °· Do not use street drugs or medicines that have not been prescribed to you during your pregnancy. °· Talk with your health care provider before taking any herbal supplements, even if you have been taking them regularly. °· Make sure you gain a healthy amount of weight during your pregnancy. °· Watch for infection. If you think that you might have an infection, get it checked right away. °· Make sure to tell your health care provider if you have gone into preterm labor before. °This information is not intended to replace advice given to you by your health care provider. Make sure you discuss any questions you have with your   health care provider. °Document Released: 02/03/2004 Document Revised: 04/25/2016 Document Reviewed: 04/05/2016 °Elsevier Interactive Patient Education © 2017 [employer] ° °

## 2017-04-26 ENCOUNTER — Ambulatory Visit: Payer: Self-pay

## 2017-05-03 ENCOUNTER — Ambulatory Visit (INDEPENDENT_AMBULATORY_CARE_PROVIDER_SITE_OTHER): Payer: 59 | Admitting: Obstetrics and Gynecology

## 2017-05-03 ENCOUNTER — Other Ambulatory Visit (HOSPITAL_COMMUNITY)
Admission: RE | Admit: 2017-05-03 | Discharge: 2017-05-03 | Disposition: A | Discharge status: Home / Self Care | Payer: 59 | Source: Ambulatory Visit | Attending: Obstetrics and Gynecology | Admitting: Obstetrics and Gynecology

## 2017-05-03 ENCOUNTER — Inpatient Hospital Stay (HOSPITAL_COMMUNITY)
Admission: AD | Admit: 2017-05-03 | Discharge: 2017-05-04 | Disposition: A | Discharge status: Home / Self Care | Payer: 59 | Source: Ambulatory Visit | Attending: Family Medicine | Admitting: Family Medicine

## 2017-05-03 VITALS — BP 118/75 | HR 84 | Wt 227.0 lb

## 2017-05-03 DIAGNOSIS — Z825 Family history of asthma and other chronic lower respiratory diseases: Secondary | ICD-10-CM | POA: Diagnosis not present

## 2017-05-03 DIAGNOSIS — O35EXX0 Maternal care for other (suspected) fetal abnormality and damage, fetal genitourinary anomalies, not applicable or unspecified: Secondary | ICD-10-CM

## 2017-05-03 DIAGNOSIS — Z833 Family history of diabetes mellitus: Secondary | ICD-10-CM | POA: Diagnosis not present

## 2017-05-03 DIAGNOSIS — Z3A36 36 weeks gestation of pregnancy: Secondary | ICD-10-CM | POA: Insufficient documentation

## 2017-05-03 DIAGNOSIS — Z9889 Other specified postprocedural states: Secondary | ICD-10-CM | POA: Insufficient documentation

## 2017-05-03 DIAGNOSIS — Z79899 Other long term (current) drug therapy: Secondary | ICD-10-CM | POA: Diagnosis not present

## 2017-05-03 DIAGNOSIS — O0993 Supervision of high risk pregnancy, unspecified, third trimester: Secondary | ICD-10-CM | POA: Insufficient documentation

## 2017-05-03 DIAGNOSIS — O3430 Maternal care for cervical incompetence, unspecified trimester: Secondary | ICD-10-CM

## 2017-05-03 DIAGNOSIS — Z8249 Family history of ischemic heart disease and other diseases of the circulatory system: Secondary | ICD-10-CM | POA: Insufficient documentation

## 2017-05-03 DIAGNOSIS — R102 Pelvic and perineal pain unspecified side: Secondary | ICD-10-CM

## 2017-05-03 DIAGNOSIS — Z113 Encounter for screening for infections with a predominantly sexual mode of transmission: Secondary | ICD-10-CM | POA: Diagnosis not present

## 2017-05-03 DIAGNOSIS — E282 Polycystic ovarian syndrome: Secondary | ICD-10-CM | POA: Diagnosis not present

## 2017-05-03 DIAGNOSIS — Z803 Family history of malignant neoplasm of breast: Secondary | ICD-10-CM | POA: Insufficient documentation

## 2017-05-03 DIAGNOSIS — O99283 Endocrine, nutritional and metabolic diseases complicating pregnancy, third trimester: Secondary | ICD-10-CM | POA: Diagnosis not present

## 2017-05-03 DIAGNOSIS — O3432 Maternal care for cervical incompetence, second trimester: Secondary | ICD-10-CM

## 2017-05-03 DIAGNOSIS — O09299 Supervision of pregnancy with other poor reproductive or obstetric history, unspecified trimester: Secondary | ICD-10-CM

## 2017-05-03 DIAGNOSIS — O358XX0 Maternal care for other (suspected) fetal abnormality and damage, not applicable or unspecified: Secondary | ICD-10-CM

## 2017-05-03 DIAGNOSIS — O99213 Obesity complicating pregnancy, third trimester: Secondary | ICD-10-CM

## 2017-05-03 DIAGNOSIS — Z87891 Personal history of nicotine dependence: Secondary | ICD-10-CM | POA: Diagnosis not present

## 2017-05-03 DIAGNOSIS — Z808 Family history of malignant neoplasm of other organs or systems: Secondary | ICD-10-CM | POA: Diagnosis not present

## 2017-05-03 DIAGNOSIS — O26893 Other specified pregnancy related conditions, third trimester: Secondary | ICD-10-CM | POA: Insufficient documentation

## 2017-05-03 DIAGNOSIS — Z9049 Acquired absence of other specified parts of digestive tract: Secondary | ICD-10-CM | POA: Diagnosis not present

## 2017-05-03 DIAGNOSIS — O9989 Other specified diseases and conditions complicating pregnancy, childbirth and the puerperium: Secondary | ICD-10-CM | POA: Diagnosis not present

## 2017-05-03 DIAGNOSIS — O0990 Supervision of high risk pregnancy, unspecified, unspecified trimester: Secondary | ICD-10-CM

## 2017-05-03 DIAGNOSIS — R109 Unspecified abdominal pain: Secondary | ICD-10-CM | POA: Diagnosis not present

## 2017-05-03 DIAGNOSIS — O09899 Supervision of other high risk pregnancies, unspecified trimester: Secondary | ICD-10-CM

## 2017-05-03 DIAGNOSIS — O09219 Supervision of pregnancy with history of pre-term labor, unspecified trimester: Secondary | ICD-10-CM

## 2017-05-03 DIAGNOSIS — O09212 Supervision of pregnancy with history of pre-term labor, second trimester: Secondary | ICD-10-CM

## 2017-05-03 DIAGNOSIS — N949 Unspecified condition associated with female genital organs and menstrual cycle: Secondary | ICD-10-CM | POA: Diagnosis not present

## 2017-05-03 DIAGNOSIS — O34219 Maternal care for unspecified type scar from previous cesarean delivery: Secondary | ICD-10-CM

## 2017-05-03 DIAGNOSIS — O09292 Supervision of pregnancy with other poor reproductive or obstetric history, second trimester: Secondary | ICD-10-CM

## 2017-05-03 LAB — OB RESULTS CONSOLE GC/CHLAMYDIA: Gonorrhea: NEGATIVE

## 2017-05-03 LAB — OB RESULTS CONSOLE GBS: GBS: NEGATIVE

## 2017-05-03 MED ORDER — HYDROXYPROGESTERONE CAPROATE 250 MG/ML IM OIL
250.0000 mg | TOPICAL_OIL | Freq: Once | INTRAMUSCULAR | Status: AC
  Administered 2017-05-03: 250 mg via INTRAMUSCULAR

## 2017-05-03 NOTE — MAU Note (Signed)
PT SAYS   FEELS  SOME   UC,  PRESSURE- SINCE WAS IN OFFICE -  WHERE CIRCLAGE WAS REMOVED-   1 CM   . PLANS  FOR VBAC

## 2017-05-03 NOTE — Progress Notes (Signed)
   PRENATAL VISIT NOTE  Subjective:  Sarah Sutton is a 26 y.o. [id number] at 6668w0d being seen today for ongoing prenatal care.  She is currently monitored for the following issues for this high-risk pregnancy and has History of incompetent cervix, currently pregnant; History of preterm delivery, currently pregnant; Short interval between pregnancies affecting pregnancy, antepartum; Previous cesarean section complicating pregnancy; Supervision of high risk pregnancy, antepartum; Obesity complicating pregnancy; Pyelectasis of fetus on prenatal ultrasound; McDonald cerclage present, antepartum; and At risk for postpartum depression on her problem list.  Patient reports no complaints.  Contractions: Not present. Vag. Bleeding: None.  Movement: Present. Denies leaking of fluid.   The following portions of the patient's history were reviewed and updated as appropriate: allergies, current medications, past family history, past medical history, past social history, past surgical history and problem list. Problem list updated.  Objective:   Vitals:   05/03/17 0831  BP: 118/75  Pulse: 84  Weight: 227 lb (103 kg)    Fetal Status: Fetal Heart Rate (bpm): 142 Fundal Height: 36 cm Movement: Present  Presentation: Vertex  General:  Alert, oriented and cooperative. Patient is in no acute distress.  Skin: Skin is warm and dry. No rash noted.   Cardiovascular: Normal heart rate noted  Respiratory: Normal respiratory effort, no problems with respiration noted  Abdomen: Soft, gravid, appropriate for gestational age. Pain/Pressure: Present     Pelvic:  Cervical exam performed Dilation: 1 Effacement (%): 50 Station: -3  Extremities: Normal range of motion.  Edema: Trace  Mental Status: Normal mood and affect. Normal behavior. Normal judgment and thought content.   Assessment and Plan:  Pregnancy: A5W0981G3P0202 at 2268w0d  1. Supervision of high risk pregnancy, antepartum Patient is doing well  Cultures  collected  2. Previous cesarean section complicating pregnancy Plans for TOLAC  3. McDonald cerclage present, antepartum Cerclage removed today with some difficulty. Cerclage was imbeded in cervical tissue resulting in some bleeding. Suture material was cut but the entire length of the suture with the knot could not be removed without inflicting pain to the patient. Procedure was aborted. Cervical exam was 1/50/-3  4. History of incompetent cervix, currently pregnant S/p cerclage  5. History of preterm delivery, currently pregnant 17-P today  6. Pyelectasis of fetus on prenatal ultrasound resolved  Preterm labor symptoms and general obstetric precautions including but not limited to vaginal bleeding, contractions, leaking of fluid and fetal movement were reviewed in detail with the patient. Please refer to After Visit Summary for other counseling recommendations.  Return in about 1 week (around 05/10/2017) for ROB.   Catalina AntiguaPeggy Kylena Mole, MD

## 2017-05-04 DIAGNOSIS — Z9889 Other specified postprocedural states: Secondary | ICD-10-CM | POA: Diagnosis not present

## 2017-05-04 DIAGNOSIS — N949 Unspecified condition associated with female genital organs and menstrual cycle: Secondary | ICD-10-CM | POA: Diagnosis not present

## 2017-05-04 DIAGNOSIS — O26893 Other specified pregnancy related conditions, third trimester: Secondary | ICD-10-CM

## 2017-05-04 DIAGNOSIS — Z3A36 36 weeks gestation of pregnancy: Secondary | ICD-10-CM | POA: Diagnosis not present

## 2017-05-04 DIAGNOSIS — Z833 Family history of diabetes mellitus: Secondary | ICD-10-CM | POA: Diagnosis not present

## 2017-05-04 DIAGNOSIS — O9989 Other specified diseases and conditions complicating pregnancy, childbirth and the puerperium: Secondary | ICD-10-CM

## 2017-05-04 DIAGNOSIS — E282 Polycystic ovarian syndrome: Secondary | ICD-10-CM | POA: Diagnosis not present

## 2017-05-04 DIAGNOSIS — O99283 Endocrine, nutritional and metabolic diseases complicating pregnancy, third trimester: Secondary | ICD-10-CM | POA: Diagnosis not present

## 2017-05-04 DIAGNOSIS — Z825 Family history of asthma and other chronic lower respiratory diseases: Secondary | ICD-10-CM | POA: Diagnosis not present

## 2017-05-04 DIAGNOSIS — Z9049 Acquired absence of other specified parts of digestive tract: Secondary | ICD-10-CM | POA: Diagnosis not present

## 2017-05-04 DIAGNOSIS — R109 Unspecified abdominal pain: Secondary | ICD-10-CM | POA: Diagnosis not present

## 2017-05-04 LAB — GC/CHLAMYDIA PROBE AMP (~~LOC~~) NOT AT ARMC
Chlamydia: NEGATIVE
NEISSERIA GONORRHEA: NEGATIVE

## 2017-05-04 LAB — OB RESULTS CONSOLE GC/CHLAMYDIA: GC PROBE AMP, GENITAL: NEGATIVE

## 2017-05-04 NOTE — Discharge Instructions (Signed)

## 2017-05-04 NOTE — MAU Provider Note (Signed)
History     CSN: [id number]  Arrival date and time: 05/03/17 2312   First Provider Initiated Contact with Patient 05/04/17 0018      Chief Complaint  Patient presents with  . Abdominal Pain   HPI  [name] is a 26 yo G3P0202 at 36.[redacted] wks gestation presenting with complaints of pelvic pressure and irregular contractions while working tonight.  She was seen in the office today and her cerclage was removed by Dr. Jolayne Panther.  Her cervix was dilated 1 cm after a "difficult" cerclage removal.  She states that this is the longest pregnancy she has had, so she is not used to all the pelvic pressure that might be normal at this time in pregnancy.  She has had very little activity today as she prepared to be rested to work all night tonight. She reports good FM.  Past Medical History:  Diagnosis Date  . Abnormal Pap smear 2007   COLPO DONE;LAST PAP 03/2011 WAS NORMAL  . Anemia    NO MEDS IN PAST  . Heart murmur    AS A CHILD, never caused any problems  . NSVD (normal spontaneous vaginal delivery) 09/30/2012   Preterm delivery at 34 weeks; IOL for PPROM  . Polycystic ovarian syndrome 2009   by Dr. Gaynell Face  . Preterm labor     Past Surgical History:  Procedure Laterality Date  . CERVICAL CERCLAGE  07/01/2012   Procedure: CERCLAGE CERVICAL;  Surgeon: Esmeralda Arthur, MD;  Location: [facility];  Service: Gynecology;  Laterality: N/A;  . CERVICAL CERCLAGE N/A 09/01/2015   Procedure: CERCLAGE CERVICAL;  Surgeon: Jaymes Graff, MD;  Location: [facility];  Service: Gynecology;  Laterality: N/A;  . CERVICAL CERCLAGE N/A 12/15/2015   Procedure: CERCLAGE CERVICAL removal;  Surgeon: Jaymes Graff, MD;  Location: [facility];  Service: Obstetrics;  Laterality: N/A;  . CERVICAL CERCLAGE N/A 11/21/2016   Procedure: CERCLAGE CERVICAL;  Surgeon: Willodean Rosenthal, MD;  Location: [facility];  Service: Gynecology;  Laterality: N/A;  . CESAREAN SECTION N/A 12/15/2015   Procedure: CESAREAN SECTION;  Surgeon: Jaymes Graff, MD;  Location: [facility];  Service: Obstetrics;  Laterality: N/A;  . CHOLECYSTECTOMY  november 2009  . COLPOSCOPY  2009  . WISDOM TOOTH EXTRACTION     ALL 4 REMOVED    Family History  Problem Relation Age of Onset  . Asthma Mother   . Diabetes Father   . Hypertension Father   . Cancer Maternal Grandmother        bone cancer  . Cancer Paternal Grandmother        breast cancer    Social History  Substance Use Topics  . Smoking status: Former Smoker    Packs/day: 0.50    Years: 10.00    Types: Cigarettes    Quit date: 06/28/2015  . Smokeless tobacco: Never Used     Comment: quit smoking with found out about pregnancy  . Alcohol use No    Allergies: No Known Allergies  Facility-Administered Medications Prior to Admission  Medication Dose Route Frequency Provider Last Rate Last Dose  . hydroxyprogesterone caproate (MAKENA) 250 mg/mL injection 250 mg  250 mg Intramuscular Weekly Adam Phenix, MD   250 mg at 04/19/17 1151  . hydroxyprogesterone caproate (MAKENA) 250 mg/mL injection 250 mg  250 mg Intramuscular Once Adam Phenix, MD       Prescriptions Prior to Admission  Medication Sig Dispense Refill Last Dose  . hydroxyprogesterone caproate (MAKENA) 250 mg/mL OIL  injection Inject 250 mg into the muscle once.   Not Taking  . moxifloxacin (VIGAMOX) 0.5 % ophthalmic solution Place 1 drop into both eyes 3 (three) times daily. (Patient not taking: Reported on 05/03/2017) 3 mL 0 Not Taking  . Prenatal Vit-Fe Fumarate-FA (PRENATAL MULTIVITAMIN) TABS tablet Take 1 tablet by mouth daily at 12 noon.    Taking    Review of Systems  Constitutional: Negative.   HENT: Negative.   Eyes: Negative.   Respiratory: Negative.   Cardiovascular: Negative.   Gastrointestinal: Negative.   Genitourinary: Positive for pelvic pain (pressure).  Musculoskeletal: Negative.   Skin: Negative.   Allergic/Immunologic: Negative.   Neurological: Negative.   Hematological: Negative.    Psychiatric/Behavioral: Negative.    Physical Exam   Blood pressure 127/67, pulse 78, temperature 98.1 F (36.7 C), temperature source Oral, resp. rate 18, height 5\' 7"  (1.702 m), weight 104.9 kg (231 lb 4 oz), last menstrual period 08/03/2016, SpO2 100 %.  Physical Exam  Constitutional: She is oriented to person, place, and time. She appears well-developed and well-nourished.  HENT:  Head: Normocephalic.  Respiratory: Effort normal.  GI: Soft.  Genitourinary:  Genitourinary Comments: Gravid, soft, non-tender, VE by RN  Musculoskeletal: Normal range of motion.  Neurological: She is alert and oriented to person, place, and time.  Skin: Skin is warm and dry.  Psychiatric: She has a normal mood and affect. Her behavior is normal. Judgment and thought content normal.    Dilation: 1 Effacement (%): Thick Cervical Position: Posterior Station: -2 Exam by:: Millner, RN   CEFM  FHR: 135 bpm / moderate variability / accels present / decels absent TOCO: none  MAU Course  Procedures  MDM NST Cervical exam by RN  Assessment and Plan  Pelvic pressure in pregnancy, antepartum, third trimester - Increase daily water intake to at least 8 bottles per day - Rest as much as possible - May return to work - Keep scheduled appt on 6/14 - May return to MAU for labor evaluation and/or emergencies  Discharge home   Raelyn MoraRolitta Rona Tomson MSN, CNM 05/04/2017, 12:26 AM

## 2017-05-05 LAB — STREP GP B NAA: Strep Gp B NAA: NEGATIVE

## 2017-05-10 ENCOUNTER — Inpatient Hospital Stay (HOSPITAL_COMMUNITY): Payer: 59 | Admitting: Anesthesiology

## 2017-05-10 ENCOUNTER — Encounter (HOSPITAL_COMMUNITY): Payer: Self-pay

## 2017-05-10 ENCOUNTER — Inpatient Hospital Stay (HOSPITAL_COMMUNITY)
Admission: AD | Admit: 2017-05-10 | Discharge: 2017-05-13 | DRG: 765 | Disposition: A | Discharge status: Home / Self Care | Payer: 59 | Source: Ambulatory Visit | Attending: Obstetrics & Gynecology | Admitting: Obstetrics & Gynecology

## 2017-05-10 ENCOUNTER — Encounter: Payer: Self-pay | Admitting: Obstetrics and Gynecology

## 2017-05-10 ENCOUNTER — Encounter (HOSPITAL_COMMUNITY)
Admission: AD | Disposition: A | Discharge status: Home / Self Care | Payer: Self-pay | Source: Ambulatory Visit | Attending: Obstetrics & Gynecology

## 2017-05-10 DIAGNOSIS — O09899 Supervision of other high risk pregnancies, unspecified trimester: Secondary | ICD-10-CM

## 2017-05-10 DIAGNOSIS — O4202 Full-term premature rupture of membranes, onset of labor within 24 hours of rupture: Secondary | ICD-10-CM | POA: Diagnosis not present

## 2017-05-10 DIAGNOSIS — Z87891 Personal history of nicotine dependence: Secondary | ICD-10-CM

## 2017-05-10 DIAGNOSIS — O0990 Supervision of high risk pregnancy, unspecified, unspecified trimester: Secondary | ICD-10-CM

## 2017-05-10 DIAGNOSIS — Z9049 Acquired absence of other specified parts of digestive tract: Secondary | ICD-10-CM | POA: Diagnosis not present

## 2017-05-10 DIAGNOSIS — O09219 Supervision of pregnancy with history of pre-term labor, unspecified trimester: Secondary | ICD-10-CM

## 2017-05-10 DIAGNOSIS — O34219 Maternal care for unspecified type scar from previous cesarean delivery: Secondary | ICD-10-CM | POA: Diagnosis present

## 2017-05-10 DIAGNOSIS — O3433 Maternal care for cervical incompetence, third trimester: Secondary | ICD-10-CM | POA: Diagnosis present

## 2017-05-10 DIAGNOSIS — Z3A00 Weeks of gestation of pregnancy not specified: Secondary | ICD-10-CM | POA: Diagnosis not present

## 2017-05-10 DIAGNOSIS — O4290 Premature rupture of membranes, unspecified as to length of time between rupture and onset of labor, unspecified weeks of gestation: Secondary | ICD-10-CM | POA: Diagnosis present

## 2017-05-10 DIAGNOSIS — Z6835 Body mass index (BMI) 35.0-35.9, adult: Secondary | ICD-10-CM | POA: Diagnosis not present

## 2017-05-10 DIAGNOSIS — O358XX0 Maternal care for other (suspected) fetal abnormality and damage, not applicable or unspecified: Secondary | ICD-10-CM

## 2017-05-10 DIAGNOSIS — O34211 Maternal care for low transverse scar from previous cesarean delivery: Secondary | ICD-10-CM | POA: Diagnosis present

## 2017-05-10 DIAGNOSIS — O99214 Obesity complicating childbirth: Secondary | ICD-10-CM | POA: Diagnosis present

## 2017-05-10 DIAGNOSIS — O99213 Obesity complicating pregnancy, third trimester: Secondary | ICD-10-CM

## 2017-05-10 DIAGNOSIS — O35EXX0 Maternal care for other (suspected) fetal abnormality and damage, fetal genitourinary anomalies, not applicable or unspecified: Secondary | ICD-10-CM

## 2017-05-10 DIAGNOSIS — Z3A37 37 weeks gestation of pregnancy: Secondary | ICD-10-CM

## 2017-05-10 DIAGNOSIS — O09299 Supervision of pregnancy with other poor reproductive or obstetric history, unspecified trimester: Secondary | ICD-10-CM

## 2017-05-10 HISTORY — DX: Premature rupture of membranes, unspecified as to length of time between rupture and onset of labor, unspecified weeks of gestation: O42.90

## 2017-05-10 LAB — CBC
HCT: 33.6 % — ABNORMAL LOW (ref 36.0–46.0)
Hemoglobin: 11.5 g/dL — ABNORMAL LOW (ref 12.0–15.0)
MCH: 29.1 pg (ref 26.0–34.0)
MCHC: 34.2 g/dL (ref 30.0–36.0)
MCV: 85.1 fL (ref 78.0–100.0)
PLATELETS: 267 10*3/uL (ref 150–400)
RBC: 3.95 MIL/uL (ref 3.87–5.11)
RDW: 14.1 % (ref 11.5–15.5)
WBC: 8.9 10*3/uL (ref 4.0–10.5)

## 2017-05-10 LAB — RPR: RPR: NONREACTIVE

## 2017-05-10 LAB — POCT FERN TEST: POCT Fern Test: POSITIVE

## 2017-05-10 LAB — TYPE AND SCREEN
ABO/RH(D): A POS
Antibody Screen: NEGATIVE

## 2017-05-10 SURGERY — Surgical Case
Anesthesia: Epidural

## 2017-05-10 MED ORDER — MORPHINE SULFATE (PF) 0.5 MG/ML IJ SOLN
INTRAMUSCULAR | Status: DC | PRN
  Administered 2017-05-10: 3 mg via EPIDURAL

## 2017-05-10 MED ORDER — METOCLOPRAMIDE HCL 5 MG/ML IJ SOLN
INTRAMUSCULAR | Status: DC | PRN
  Administered 2017-05-10: 10 mg via INTRAVENOUS

## 2017-05-10 MED ORDER — KETOROLAC TROMETHAMINE 30 MG/ML IJ SOLN: 30.0000 mg | Freq: Once | INTRAMUSCULAR | Status: DC | PRN

## 2017-05-10 MED ORDER — DEXAMETHASONE SODIUM PHOSPHATE 10 MG/ML IJ SOLN
INTRAMUSCULAR | Status: AC
  Filled 2017-05-10: qty 1

## 2017-05-10 MED ORDER — LACTATED RINGERS IV SOLN
INTRAVENOUS | Status: DC | PRN
  Administered 2017-05-10: 21:00:00 via INTRAVENOUS

## 2017-05-10 MED ORDER — PHENYLEPHRINE 40 MCG/ML (10ML) SYRINGE FOR IV PUSH (FOR BLOOD PRESSURE SUPPORT): 80.0000 ug | PREFILLED_SYRINGE | INTRAVENOUS | Status: DC | PRN

## 2017-05-10 MED ORDER — KETOROLAC TROMETHAMINE 30 MG/ML IJ SOLN
30.0000 mg | Freq: Four times a day (QID) | INTRAMUSCULAR | Status: AC | PRN
  Administered 2017-05-10: 30 mg via INTRAMUSCULAR

## 2017-05-10 MED ORDER — ONDANSETRON HCL 4 MG/2ML IJ SOLN
INTRAMUSCULAR | Status: DC | PRN
  Administered 2017-05-10: 4 mg via INTRAVENOUS

## 2017-05-10 MED ORDER — EPHEDRINE 5 MG/ML INJ: 10.0000 mg | INTRAVENOUS | Status: DC | PRN

## 2017-05-10 MED ORDER — CEFAZOLIN SODIUM-DEXTROSE 2-3 GM-% IV SOLR
INTRAVENOUS | Status: DC | PRN
  Administered 2017-05-10: 2 g via INTRAVENOUS

## 2017-05-10 MED ORDER — SCOPOLAMINE 1 MG/3DAYS TD PT72
MEDICATED_PATCH | TRANSDERMAL | Status: AC
  Filled 2017-05-10: qty 1

## 2017-05-10 MED ORDER — SODIUM BICARBONATE 8.4 % IV SOLN
INTRAVENOUS | Status: DC | PRN
  Administered 2017-05-10: 3 mL via EPIDURAL
  Administered 2017-05-10 (×2): 5 mL via EPIDURAL

## 2017-05-10 MED ORDER — BUPIVACAINE HCL (PF) 0.5 % IJ SOLN
INTRAMUSCULAR | Status: AC
  Filled 2017-05-10: qty 30

## 2017-05-10 MED ORDER — TERBUTALINE SULFATE 1 MG/ML IJ SOLN: 0.2500 mg | Freq: Once | INTRAMUSCULAR | Status: DC | PRN

## 2017-05-10 MED ORDER — LIDOCAINE HCL (PF) 1 % IJ SOLN
INTRAMUSCULAR | Status: DC | PRN
  Administered 2017-05-10 (×2): 6 mL via EPIDURAL

## 2017-05-10 MED ORDER — EPHEDRINE 5 MG/ML INJ
10.0000 mg | INTRAVENOUS | Status: DC | PRN
Start: 2017-05-10 — End: 2017-05-11

## 2017-05-10 MED ORDER — OXYCODONE-ACETAMINOPHEN 5-325 MG PO TABS: 1.0000 | ORAL_TABLET | ORAL | Status: DC | PRN

## 2017-05-10 MED ORDER — SODIUM BICARBONATE 8.4 % IV SOLN
INTRAVENOUS | Status: AC
  Filled 2017-05-10: qty 50

## 2017-05-10 MED ORDER — OXYCODONE-ACETAMINOPHEN 5-325 MG PO TABS: 2.0000 | ORAL_TABLET | ORAL | Status: DC | PRN

## 2017-05-10 MED ORDER — ONDANSETRON HCL 4 MG/2ML IJ SOLN
4.0000 mg | Freq: Four times a day (QID) | INTRAMUSCULAR | Status: DC | PRN
  Administered 2017-05-10: 4 mg via INTRAVENOUS
  Filled 2017-05-10: qty 2

## 2017-05-10 MED ORDER — LACTATED RINGERS IV SOLN
INTRAVENOUS | Status: DC | PRN
  Administered 2017-05-10: 40 [IU] via INTRAVENOUS

## 2017-05-10 MED ORDER — OXYTOCIN 40 UNITS IN LACTATED RINGERS INFUSION - SIMPLE MED
1.0000 m[IU]/min | INTRAVENOUS | Status: DC
  Administered 2017-05-10: 2 m[IU]/min via INTRAVENOUS

## 2017-05-10 MED ORDER — DIPHENHYDRAMINE HCL 50 MG/ML IJ SOLN: 12.5000 mg | INTRAMUSCULAR | Status: DC | PRN

## 2017-05-10 MED ORDER — LACTATED RINGERS IV SOLN
500.0000 mL | Freq: Once | INTRAVENOUS | Status: AC
  Administered 2017-05-10: 500 mL via INTRAVENOUS

## 2017-05-10 MED ORDER — PHENYLEPHRINE 40 MCG/ML (10ML) SYRINGE FOR IV PUSH (FOR BLOOD PRESSURE SUPPORT)
80.0000 ug | PREFILLED_SYRINGE | INTRAVENOUS | Status: DC | PRN
Start: 2017-05-10 — End: 2017-05-11
  Filled 2017-05-10 (×2): qty 10

## 2017-05-10 MED ORDER — BUPIVACAINE HCL (PF) 0.5 % IJ SOLN
INTRAMUSCULAR | Status: DC | PRN
  Administered 2017-05-10: 30 mL

## 2017-05-10 MED ORDER — ACETAMINOPHEN 325 MG PO TABS
650.0000 mg | ORAL_TABLET | ORAL | Status: DC | PRN
  Administered 2017-05-10: 650 mg via ORAL
  Filled 2017-05-10: qty 2

## 2017-05-10 MED ORDER — SOD CITRATE-CITRIC ACID 500-334 MG/5ML PO SOLN
30.0000 mL | ORAL | Status: DC | PRN
  Administered 2017-05-10: 30 mL via ORAL
  Filled 2017-05-10: qty 15

## 2017-05-10 MED ORDER — METOCLOPRAMIDE HCL 5 MG/ML IJ SOLN
INTRAMUSCULAR | Status: AC
  Filled 2017-05-10: qty 2

## 2017-05-10 MED ORDER — KETOROLAC TROMETHAMINE 30 MG/ML IJ SOLN
INTRAMUSCULAR | Status: AC
  Administered 2017-05-10: 30 mg via INTRAMUSCULAR
  Filled 2017-05-10: qty 1

## 2017-05-10 MED ORDER — LACTATED RINGERS IV SOLN: 500.0000 mL | INTRAVENOUS | Status: DC | PRN

## 2017-05-10 MED ORDER — LIDOCAINE-EPINEPHRINE (PF) 2 %-1:200000 IJ SOLN
INTRAMUSCULAR | Status: AC
  Filled 2017-05-10: qty 20

## 2017-05-10 MED ORDER — SODIUM CHLORIDE 0.9 % IR SOLN
Status: DC | PRN
  Administered 2017-05-10: 1

## 2017-05-10 MED ORDER — LIDOCAINE HCL (PF) 1 % IJ SOLN: 30.0000 mL | INTRAMUSCULAR | Status: DC | PRN

## 2017-05-10 MED ORDER — DEXAMETHASONE SODIUM PHOSPHATE 10 MG/ML IJ SOLN
INTRAMUSCULAR | Status: DC | PRN
  Administered 2017-05-10: 10 mg via INTRAVENOUS

## 2017-05-10 MED ORDER — FENTANYL 2.5 MCG/ML BUPIVACAINE 1/10 % EPIDURAL INFUSION (WH - ANES)
14.0000 mL/h | INTRAMUSCULAR | Status: DC | PRN
  Administered 2017-05-10 (×2): 14 mL/h via EPIDURAL
  Filled 2017-05-10 (×3): qty 100

## 2017-05-10 MED ORDER — FENTANYL CITRATE (PF) 100 MCG/2ML IJ SOLN: 100.0000 ug | INTRAMUSCULAR | Status: DC | PRN

## 2017-05-10 MED ORDER — OXYTOCIN BOLUS FROM INFUSION: 500.0000 mL | Freq: Once | INTRAVENOUS | Status: DC

## 2017-05-10 MED ORDER — OXYTOCIN 10 UNIT/ML IJ SOLN
INTRAMUSCULAR | Status: AC
  Filled 2017-05-10: qty 4

## 2017-05-10 MED ORDER — ONDANSETRON HCL 4 MG/2ML IJ SOLN
INTRAMUSCULAR | Status: AC
  Filled 2017-05-10: qty 2

## 2017-05-10 MED ORDER — OXYTOCIN 40 UNITS IN LACTATED RINGERS INFUSION - SIMPLE MED
2.5000 [IU]/h | INTRAVENOUS | Status: DC
  Filled 2017-05-10: qty 1000

## 2017-05-10 MED ORDER — CEFAZOLIN SODIUM-DEXTROSE 2-4 GM/100ML-% IV SOLN
INTRAVENOUS | Status: AC
  Filled 2017-05-10: qty 100

## 2017-05-10 MED ORDER — LACTATED RINGERS IV SOLN
INTRAVENOUS | Status: DC
  Administered 2017-05-10 (×4): via INTRAVENOUS

## 2017-05-10 MED ORDER — KETOROLAC TROMETHAMINE 30 MG/ML IJ SOLN
30.0000 mg | Freq: Four times a day (QID) | INTRAMUSCULAR | Status: AC | PRN
Start: 2017-05-10 — End: 2017-05-11

## 2017-05-10 MED ORDER — MORPHINE SULFATE (PF) 0.5 MG/ML IJ SOLN
INTRAMUSCULAR | Status: AC
  Filled 2017-05-10: qty 10

## 2017-05-10 SURGICAL SUPPLY — 35 items
BAG DECANTER FOR FLEXI CONT (MISCELLANEOUS) ×1 IMPLANT
BARRIER ADHS 3X4 INTERCEED (GAUZE/BANDAGES/DRESSINGS) IMPLANT
BRR ADH 4X3 ABS CNTRL BYND (GAUZE/BANDAGES/DRESSINGS)
CHLORAPREP W/TINT 26ML (MISCELLANEOUS) ×2 IMPLANT
CLAMP CORD UMBIL (MISCELLANEOUS) IMPLANT
CLOTH BEACON ORANGE TIMEOUT ST (SAFETY) ×2 IMPLANT
DRSG OPSITE POSTOP 4X10 (GAUZE/BANDAGES/DRESSINGS) ×2 IMPLANT
ELECT REM PT RETURN 9FT ADLT (ELECTROSURGICAL) ×2
ELECTRODE REM PT RTRN 9FT ADLT (ELECTROSURGICAL) ×1 IMPLANT
EXTRACTOR VACUUM KIWI (MISCELLANEOUS) IMPLANT
GLOVE BIO SURGEON STRL SZ 6.5 (GLOVE) ×2 IMPLANT
GLOVE BIOGEL PI IND STRL 7.0 (GLOVE) ×2 IMPLANT
GLOVE BIOGEL PI INDICATOR 7.0 (GLOVE) ×2
GOWN STRL REUS W/TWL LRG LVL3 (GOWN DISPOSABLE) ×4 IMPLANT
KIT ABG SYR 3ML LUER SLIP (SYRINGE) IMPLANT
NDL HYPO 25X5/8 SAFETYGLIDE (NEEDLE) IMPLANT
NEEDLE HYPO 22GX1.5 SAFETY (NEEDLE) IMPLANT
NEEDLE HYPO 25X5/8 SAFETYGLIDE (NEEDLE) IMPLANT
NS IRRIG 1000ML POUR BTL (IV SOLUTION) ×2 IMPLANT
PACK C SECTION WH (CUSTOM PROCEDURE TRAY) ×2 IMPLANT
PAD ABD 7.5X8 STRL (GAUZE/BANDAGES/DRESSINGS) ×2 IMPLANT
PAD OB MATERNITY 4.3X12.25 (PERSONAL CARE ITEMS) ×2 IMPLANT
PENCIL SMOKE EVAC W/HOLSTER (ELECTROSURGICAL) ×2 IMPLANT
RETRACTOR WND ALEXIS 25 LRG (MISCELLANEOUS) IMPLANT
RTRCTR C-SECT PINK 25CM LRG (MISCELLANEOUS) ×1 IMPLANT
RTRCTR WOUND ALEXIS 25CM LRG (MISCELLANEOUS)
SPONGE GAUZE 4X4 12PLY STER LF (GAUZE/BANDAGES/DRESSINGS) ×2 IMPLANT
SUT PLAIN 2 0 XLH (SUTURE) ×1 IMPLANT
SUT VIC AB 0 CT1 36 (SUTURE) ×12 IMPLANT
SUT VIC AB 2-0 CT1 27 (SUTURE) ×2
SUT VIC AB 2-0 CT1 TAPERPNT 27 (SUTURE) ×1 IMPLANT
SUT VIC AB 4-0 PS2 27 (SUTURE) ×2 IMPLANT
SYR CONTROL 10ML LL (SYRINGE) IMPLANT
TOWEL OR 17X24 6PK STRL BLUE (TOWEL DISPOSABLE) ×2 IMPLANT
TRAY FOLEY BAG SILVER LF 14FR (SET/KITS/TRAYS/PACK) IMPLANT

## 2017-05-10 NOTE — Progress Notes (Signed)
Garald BraverJessica Cassady is a 26 y.o. [phone] at 6154w0d  admitted for rupture of membranes  Subjective:requests repeat cesarean section   Objective: BP 136/70   Pulse 87   Temp 98.4 F (36.9 C) (Oral)   Resp 18   Ht 5\' 7"  (1.702 m)   Wt 229 lb (103.9 kg)   LMP 08/03/2016 (Approximate)   SpO2 100%   BMI 35.87 kg/m  I/O last 3 completed shifts: In: -  Out: 500 [Urine:500] No intake/output data recorded.  Fetal Heart Rate A  Mode External filed at 05/10/2017 1159  Baseline Rate (A) 145 bpm filed at 05/10/2017 1856  Variability 6-25 BPM filed at 05/10/2017 1856  Accelerations 15 x 15 filed at 05/10/2017 1856  Decelerations Late, Variable filed at 05/10/2017 1856    UC:   regular, every 3 minutes SVE:   Dilation: 3 Effacement (%): 80, 90 Station: -2 Exam by:: fran cresenzo,cnm  Labs: Lab Results  Component Value Date   WBC 8.9 05/10/2017   HGB 11.5 (L) 05/10/2017   HCT 33.6 (L) 05/10/2017   MCV 85.1 05/10/2017   PLT 267 05/10/2017    Assessment / Plan: Protracted latent phase  Labor: prolonged latent phase Preeclampsia:  no signs or symptoms of toxicity Fetal Wellbeing:  Category I Pain Control:  Epidural I/D:  n/a Anticipated MOD:  Request repeat cesarean section The risks of cesarean section discussed with the patient included but were not limited to: bleeding which may require transfusion or reoperation; infection which may require antibiotics; injury to bowel, bladder, ureters or other surrounding organs; injury to the fetus; need for additional procedures including hysterectomy in the event of a life-threatening hemorrhage; placental abnormalities wth subsequent pregnancies, incisional problems, thromboembolic phenomenon and other postoperative/anesthesia complications. The patient concurred with the proposed plan, giving informed written consent for the procedure.   Patient has been NPO since 0100 she will remain NPO for procedure. Anesthesia and OR aware. Preoperative  prophylactic antibiotics and SCDs ordered on call to the OR.  To OR when ready.   Scheryl DarterJames Ajdin Macke 05/10/2017, 8:22 PM

## 2017-05-10 NOTE — Op Note (Signed)
Sarah BraverJessica Sutton PROCEDURE DATE: 05/10/2017  PREOPERATIVE DIAGNOSES: Intrauterine pregnancy at 6838w0d weeks gestation; elective repeat  POSTOPERATIVE DIAGNOSES: The same  PROCEDURE: Repeat Low Transverse Cesarean Section  SURGEON:  Adam PhenixArnold, Virgia Kelner G, MD   ASSISTANT:  N/a   ANESTHESIOLOGY TEAM: Anesthesiologist: Leilani AbleHatchett, Franklin, MD; Jairo BenJackson, Carswell, MD CRNA: Elgie CongoMalinova, Nataliya H, CRNA; Rhymer, Doree Fudgeolleen S, CRNA  INDICATIONS: Sarah BraverJessica Sutton is a 26 y.o. [phone] at 5438w0d here for cesarean section secondary to the indications listed under preoperative diagnoses; please see preoperative note for further details.  The risks of cesarean section were discussed with the patient including but were not limited to: bleeding which may require transfusion or reoperation; infection which may require antibiotics; injury to bowel, bladder, ureters or other surrounding organs; injury to the fetus; need for additional procedures including hysterectomy in the event of a life-threatening hemorrhage; placental abnormalities wth subsequent pregnancies, incisional problems, thromboembolic phenomenon and other postoperative/anesthesia complications.   The patient concurred with the proposed plan, giving informed written consent for the procedure.    FINDINGS:  Viable female infant in cephalic presentation.  Apgars 9 and 10.  Clear amniotic fluid.  Intact placenta, three vessel cord.  Normal uterus, fallopian tubes and ovaries bilaterally.  ANESTHESIA: Epidural  INTRAVENOUS FLUIDS: 1800 ml   ESTIMATED BLOOD LOSS: 600 ml URINE OUTPUT:  300 ml SPECIMENS: Placenta sent to L&D COMPLICATIONS: None immediate  PROCEDURE IN DETAIL:  The patient preoperatively received intravenous antibiotics and had sequential compression devices applied to her lower extremities.  She was then taken to the operating room where the epidural anesthesia was dosed up to surgical level and was found to be adequate. She was then placed in a dorsal  supine position with a leftward tilt, and prepped and draped in a sterile manner.  A foley catheter was placed into her bladder and attached to constant gravity.  After an adequate timeout was performed, a Pfannenstiel skin incision was made with scalpel over her preexisting scar and carried through to the underlying layer of fascia. The fascia was incised in the midline, and this incision was extended bilaterally using the Mayo scissors.  Kocher clamps were applied to the superior aspect of the fascial incision and the underlying rectus muscles were dissected off bluntly.  A similar process was carried out on the inferior aspect of the fascial incision. The rectus muscles were separated in the midline bluntly and the peritoneum was entered bluntly. Attention was turned to the lower uterine segment where a low transverse hysterotomy was made with a scalpel and extended bilaterally bluntly.  The infant was successfully delivered with Kiwi vacuum assist, the cord was clamped and cut after one minute, and the infant was handed over to the awaiting neonatology team. Uterine massage was then administered, and the placenta delivered intact with a three-vessel cord. The uterus was then cleared of clots and debris.  The hysterotomy was closed with 0 Vicryl in a running locked fashion, and an imbricating layer was also placed with 0 Vicryl.  The pelvis was cleared of all clot and debris. Hemostasis was confirmed on all surfaces.  The peritoneum was closed with a 0 Vicryl running stitch. The fascia was then closed using 0 Vicryl.  The subcutaneous layer was irrigated, then reapproximated with 2-0 plain gut interrupted stitches, and 30 ml of 0.5% Marcaine was injected subcutaneously around the incision.  The skin was closed with a 4-0 Vicryl subcuticular stitch. The patient tolerated the procedure well. Sponge, lap, instrument and needle counts were correct x  3.  She was taken to the recovery room in stable condition.     Adam Phenix, MD, FACOG Attending Obstetrician & Gynecologist Faculty Practice, [facility]

## 2017-05-10 NOTE — Progress Notes (Signed)
   Subjective: Sarah Sutton is a 26 y.o. [id number] at 1629w0d by ultrasound admitted for rupture of membranes.  Objective: BP 139/65   Pulse 73   Temp 97.9 F (36.6 C) (Oral)   Resp 18   Ht 5\' 7"  (1.702 m)   Wt 103.9 kg (229 lb)   LMP 08/03/2016 (Approximate)   SpO2 100%   BMI 35.87 kg/m  No intake/output data recorded. No intake/output data recorded.  FHT:  FHR: 140 bpm, variability: moderate,  accelerations:  Present,  decelerations:  Present early UC:   regular, every 2-4 minutes SVE:   Dilation: 2.5 (cerclage fully removed) Effacement (%): 80 Station: -2 Exam by:: r Welden Hausmann cnm Sterile speculum exam: clipped intact cerclage grasped with long Ring forceps and removed without difficulty / patient tolerated well  Labs: Lab Results  Component Value Date   WBC 8.9 05/10/2017   HGB 11.5 (L) 05/10/2017   HCT 33.6 (L) 05/10/2017   MCV 85.1 05/10/2017   PLT 267 05/10/2017    Assessment / Plan: Augmentation of labor, progressing well H/O Incompetent cervix  Cerclage Removal  Labor: Progressing on Pitocin, will continue to increase Preeclampsia:  n/a Fetal Wellbeing:  Category I Pain Control:  Epidural I/D:  n/a Anticipated MOD:  TOLAC  Raelyn Moraolitta Barnabas Henriques MSN, CNM 05/10/2017, 12:59 PM

## 2017-05-10 NOTE — Anesthesia Preprocedure Evaluation (Addendum)
Anesthesia Evaluation  Patient identified by MRN, date of birth, ID band Patient awake    Reviewed: Allergy & Precautions, NPO status , Patient's Chart, lab work & pertinent test results  History of Anesthesia Complications Negative for: history of anesthetic complications  Airway Mallampati: III  TM Distance: >3 FB Neck ROM: Full    Dental  (+) Dental Advisory Given   Pulmonary Current Smoker,    breath sounds clear to auscultation       Cardiovascular negative cardio ROS   Rhythm:Regular Rate:Normal     Neuro/Psych negative neurological ROS     GI/Hepatic Neg liver ROS, GERD  ,  Endo/Other  Morbid obesity  Renal/GU negative Renal ROS     Musculoskeletal   Abdominal   Peds  Hematology plt 267k   Anesthesia Other Findings   Reproductive/Obstetrics (+) Pregnancy Polycystic ovarian                            Anesthesia Physical Anesthesia Plan  ASA: II  Anesthesia Plan: Epidural   Post-op Pain Management:    Induction:   PONV Risk Score and Plan:   Airway Management Planned: Natural Airway  Additional Equipment:   Intra-op Plan:   Post-operative Plan:   Informed Consent: I have reviewed the patients History and Physical, chart, labs and discussed the procedure including the risks, benefits and alternatives for the proposed anesthesia with the patient or authorized representative who has indicated his/her understanding and acceptance.   Dental advisory given  Plan Discussed with:   Anesthesia Plan Comments: (Patient identified. Risks/Benefits/Options discussed with patient including but not limited to bleeding, infection, nerve damage, paralysis, failed block, incomplete pain control, headache, blood pressure changes, nausea, vomiting, reactions to medication both or allergic, itching and postpartum back pain. Confirmed with bedside nurse the patient's most recent platelet  count. Confirmed with patient that they are not currently taking any anticoagulation, have any bleeding history or any family history of bleeding disorders. Patient expressed understanding and wished to proceed. All questions were answered.  Patient is undergoing a csectiion with the labor epidural.)       Anesthesia Quick Evaluation

## 2017-05-10 NOTE — Anesthesia Postprocedure Evaluation (Signed)
Anesthesia Post Note  Patient: Sarah Sutton  Procedure(s) Performed: Procedure(s) (LRB): CESAREAN SECTION (N/A)     Patient location during evaluation: PACU Anesthesia Type: Epidural Level of consciousness: awake Pain management: pain level controlled Vital Signs Assessment: post-procedure vital signs reviewed and stable Respiratory status: spontaneous breathing Cardiovascular status: stable Postop Assessment: no headache, no backache, epidural receding, patient able to bend at knees and no signs of nausea or vomiting Anesthetic complications: no    Last Vitals:  Vitals:   05/10/17 2252 05/10/17 2300  BP:  (!) 126/55  Pulse: 73 77  Resp: 13 12  Temp: 37.5 C     Last Pain:  Vitals:   05/10/17 2300  TempSrc:   PainSc: 0-No pain   Pain Goal:                 Kenney Going JR,JOHN Marsella Suman

## 2017-05-10 NOTE — Anesthesia Procedure Notes (Signed)
Epidural Patient location during procedure: OB Start time: 05/10/2017 11:35 AM End time: 05/10/2017 1:02 PM  Staffing Anesthesiologist: Jairo BenJACKSON, Willer [place]orno Performed: anesthesiologist   Preanesthetic Checklist Completed: patient identified, surgical consent, pre-op evaluation, timeout performed, IV checked, risks and benefits discussed and monitors and equipment checked  Epidural Patient position: sitting Prep: site prepped and draped and DuraPrep Patient monitoring: blood pressure, continuous pulse ox and heart rate Approach: midline Location: L3-L4 Injection technique: LOR air  Needle:  Needle type: Tuohy  Needle gauge: 17 G Needle length: 9 cm Needle insertion depth: 6.5 cm Catheter type: closed end flexible Catheter size: 19 Gauge Catheter at skin depth: 12 cm Test dose: negative (1% lidocaine)  Assessment Events: blood not aspirated, injection not painful, no injection resistance, negative IV test and no paresthesia  Additional Notes Pt identified in Labor room.  Monitors applied. Working IV access confirmed. Sterile prep, drape lumbar spine.  1% lido local L 3,4.  #17ga Touhy LOR air at 6.5 cm L 3,4, cath in easily to 12 cm skin. Test dose OK, cath dosed and infusion begun.  Patient asymptomatic, VSS, no heme aspirated, tolerated well.  Sandford Craze Raquon Milledge, MDReason for block:procedure for pain

## 2017-05-10 NOTE — Progress Notes (Signed)
Patient seen for labor progress. Patient currently comfortable. 1/50/-2, category 1 tracing.  Contractions are occasional. Plan to start pitocin, increase 2 mu/min by 2 mu/min.

## 2017-05-10 NOTE — Transfer of Care (Signed)
Immediate Anesthesia Transfer of Care Note  Patient: Sarah Sutton  Procedure(s) Performed: Procedure(s): CESAREAN SECTION (N/A)  Patient Location: PACU  Anesthesia Type:Epidural  Level of Consciousness: awake, alert  and oriented  Airway & Oxygen Therapy: Patient Spontanous Breathing  Post-op Assessment: Report given to RN and Post -op Vital signs reviewed and stable  Post vital signs: Reviewed and stable BP 132/66, HR 91, RR 20, SaO2 100%  Last Vitals:  Vitals:   05/10/17 2000 05/10/17 2031  BP: 136/70 134/78  Pulse: 87 90  Resp:    Temp:      Last Pain:  Vitals:   05/10/17 1931  TempSrc: Oral  PainSc:          Complications: No apparent anesthesia complications

## 2017-05-10 NOTE — MAU Note (Signed)
Pt reports constant gushes of clear fluid since 0100. Pt states she has changed her pad several times. Pt denies contractions or vaginal bleeding. Reports good fetal movement.

## 2017-05-10 NOTE — H&P (Signed)
LABOR AND DELIVERY ADMISSION HISTORY AND PHYSICAL NOTE  Garald BraverJessica Sutton is a 26 y.o. female G3P0202 with IUP at 313w0d by [place] presenting for PROM at 1AM.   She reports positive fetal movement. She denies leakage of fluid or vaginal bleeding.  Prenatal History/Complications:  Past Medical History: Past Medical History:  Diagnosis Date  . Abnormal Pap smear 2007   COLPO DONE;LAST PAP 03/2011 WAS NORMAL  . Anemia    NO MEDS IN PAST  . Heart murmur    AS A CHILD, never caused any problems  . NSVD (normal spontaneous vaginal delivery) 09/30/2012   Preterm delivery at 34 weeks; IOL for PPROM  . Polycystic ovarian syndrome 2009   by Dr. Gaynell FaceMarshall  . Preterm labor     Past Surgical History: Past Surgical History:  Procedure Laterality Date  . CERVICAL CERCLAGE  07/01/2012   Procedure: CERCLAGE CERVICAL;  Surgeon: Esmeralda ArthurSandra A Rivard, MD;  Location: [facility];  Service: Gynecology;  Laterality: N/A;  . CERVICAL CERCLAGE N/A 09/01/2015   Procedure: CERCLAGE CERVICAL;  Surgeon: Jaymes GraffNaima Dillard, MD;  Location: [facility];  Service: Gynecology;  Laterality: N/A;  . CERVICAL CERCLAGE N/A 12/15/2015   Procedure: CERCLAGE CERVICAL removal;  Surgeon: Jaymes GraffNaima Dillard, MD;  Location: [facility];  Service: Obstetrics;  Laterality: N/A;  . CERVICAL CERCLAGE N/A 11/21/2016   Procedure: CERCLAGE CERVICAL;  Surgeon: Willodean Rosenthalarolyn Harraway-Smith, MD;  Location: [facility];  Service: Gynecology;  Laterality: N/A;  . CESAREAN SECTION N/A 12/15/2015   Procedure: CESAREAN SECTION;  Surgeon: Jaymes GraffNaima Dillard, MD;  Location: [facility];  Service: Obstetrics;  Laterality: N/A;  . CHOLECYSTECTOMY  november 2009  . COLPOSCOPY  2009  . WISDOM TOOTH EXTRACTION     ALL 4 REMOVED    Obstetrical History: OB History    Gravida Para Term Preterm AB Living   3 2 0 2 0 2   SAB TAB Ectopic Multiple Live Births   0 0 0 0 2      Social History: Social History   Social History  . Marital status: Single    Spouse name: N/A  . Number of children: 0  .  Years of education: 8013   Occupational History  . CNA [employer]   Social History Main Topics  . Smoking status: Former Smoker    Packs/day: 0.50    Years: 10.00    Types: Cigarettes    Quit date: 06/28/2015  . Smokeless tobacco: Never Used     Comment: quit smoking with found out about pregnancy  . Alcohol use No  . Drug use: Yes    Types: Marijuana     Comment: last use 08/20/15  . Sexual activity: Yes    Partners: Male    Birth control/ protection: None     Comment: EDD 06/10/2017   Other Topics Concern  . None   Social History Narrative  . None    Family History: Family History  Problem Relation Age of Onset  . Asthma Mother   . Diabetes Father   . Hypertension Father   . Cancer Maternal Grandmother        bone cancer  . Cancer Paternal Grandmother        breast cancer    Allergies: No Known Allergies  Facility-Administered Medications Prior to Admission  Medication Dose Route Frequency Provider Last Rate Last Dose  . hydroxyprogesterone caproate (MAKENA) 250 mg/mL injection 250 mg  250 mg Intramuscular Weekly Adam PhenixArnold, James G, MD   250 mg at 04/19/17  1151  . hydroxyprogesterone caproate (MAKENA) 250 mg/mL injection 250 mg  250 mg Intramuscular Once Adam Phenix, MD       Prescriptions Prior to Admission  Medication Sig Dispense Refill Last Dose  . Prenatal Vit-Fe Fumarate-FA (PRENATAL MULTIVITAMIN) TABS tablet Take 1 tablet by mouth daily at 12 noon.    Taking     Review of Systems   All systems reviewed and negative except as stated in HPI  Blood pressure 133/77, pulse 81, temperature 97.7 F (36.5 C), temperature source Oral, resp. rate 16, height 5\' 7"  (1.702 m), weight 226 lb (102.5 kg), last menstrual period 08/03/2016, SpO2 100 %, unknown if currently breastfeeding. General appearance: alert, cooperative and appears stated age Lungs: clear to auscultation bilaterally Heart: regular rate and rhythm Abdomen: soft, non-tender; bowel  sounds normal Extremities: No calf swelling or tenderness Presentation: cephalic by Ultrasound Fetal monitoring: category 1 Uterine activity:  No contractions Exam by:: Dr. Genevie Ann   Prenatal labs: ABO, Rh: A/Positive/-- (11/20 1512) Antibody: Negative (11/20 1512) Rubella: immune RPR: Non Reactive (04/19 0935)  HBsAg: Negative (11/20 1512)  HIV: Non Reactive (04/19 0935)  GBS: Negative (06/07 0935)  2 hr Glucola: normal Genetic screening:  Quad negative Anatomy US: normal  Prenatal Transfer Tool  Maternal Diabetes: No Genetic Screening: Normal Maternal Ultrasounds/Referrals: Normal Fetal Ultrasounds or other Referrals:  None Maternal Substance Abuse:  No Significant Maternal Medications:  None Significant Maternal Lab Results: None  Results for orders placed or performed during the hospital encounter of 05/10/17 (from the past 24 hour(s))  POCT fern test   Collection Time: 05/10/17  4:06 AM  Result Value Ref Range   POCT Fern Test Positive = ruptured amniotic membanes     Patient Active Problem List   Diagnosis Date Noted  . At risk for postpartum depression 04/08/2017  . McDonald cerclage present, antepartum 02/01/2017  . Pyelectasis of fetus on prenatal ultrasound 01/10/2017  . History of incompetent cervix, currently pregnant 10/16/2016  . History of preterm delivery, currently pregnant 10/16/2016  . Short interval between pregnancies affecting pregnancy, antepartum 10/16/2016  . Previous cesarean section complicating pregnancy 10/16/2016  . Supervision of high risk pregnancy, antepartum 10/16/2016  . Obesity complicating pregnancy 10/16/2016    Assessment: Sarah Sutton is a 26 y.o. U2V2536 at [redacted]w[redacted]d here for PROM  #Labor:expectant managment #Pain: Plans for epirdural #FWB: Category 1 #ID:  GBS negative #MOF: breast #MOC:mirena #Circ:  yes  Sarah Sutton 05/10/2017, 4:21 AM

## 2017-05-10 NOTE — Anesthesia Pain Management Evaluation Note (Signed)
  [facility] Pain Management Visit Note  Patient: Garald BraverJessica Orwick, 26 y.o., female  "Hello I am a member of the anesthesia team at [facility]. We have an anesthesia team available at all times to provide care throughout the hospital, including epidural management and anesthesia for C-section. I don't know your plan for the delivery whether it a natural birth, water birth, IV sedation, nitrous supplementation, doula or epidural, but we want to meet your pain goals."   1.Was your pain managed to your expectations on prior hospitalizations?   Yes   2.What is your expectation for pain management during this hospitalization?     Epidural  3.How can we help you reach that goal? unsure  Record the patient's initial score and the patient's pain goal.   Pain: 0  Pain Goal: 7 [facility] wants you to be able to say your pain was always managed very well.  Cephus ShellingBURGER,Saki Legore 05/10/2017

## 2017-05-11 ENCOUNTER — Encounter (HOSPITAL_COMMUNITY): Payer: Self-pay | Admitting: Obstetrics & Gynecology

## 2017-05-11 LAB — CBC
HEMATOCRIT: 32.8 % — AB (ref 36.0–46.0)
HEMOGLOBIN: 11.6 g/dL — AB (ref 12.0–15.0)
MCH: 30.1 pg (ref 26.0–34.0)
MCHC: 35.4 g/dL (ref 30.0–36.0)
MCV: 85.2 fL (ref 78.0–100.0)
Platelets: 265 10*3/uL (ref 150–400)
RBC: 3.85 MIL/uL — ABNORMAL LOW (ref 3.87–5.11)
RDW: 14.1 % (ref 11.5–15.5)
WBC: 18.2 10*3/uL — ABNORMAL HIGH (ref 4.0–10.5)

## 2017-05-11 MED ORDER — SIMETHICONE 80 MG PO CHEW: 80.0000 mg | CHEWABLE_TABLET | ORAL | Status: DC | PRN

## 2017-05-11 MED ORDER — ACETAMINOPHEN 325 MG PO TABS
650.0000 mg | ORAL_TABLET | ORAL | Status: DC | PRN
  Administered 2017-05-12 (×2): 650 mg via ORAL
  Filled 2017-05-11 (×2): qty 2

## 2017-05-11 MED ORDER — NALOXONE HCL 2 MG/2ML IJ SOSY
1.0000 ug/kg/h | PREFILLED_SYRINGE | INTRAVENOUS | Status: DC | PRN
  Filled 2017-05-11: qty 2

## 2017-05-11 MED ORDER — OXYCODONE HCL 5 MG PO TABS: 5.0000 mg | ORAL_TABLET | ORAL | Status: DC | PRN

## 2017-05-11 MED ORDER — MENTHOL 3 MG MT LOZG: 1.0000 | LOZENGE | OROMUCOSAL | Status: DC | PRN

## 2017-05-11 MED ORDER — NALBUPHINE HCL 10 MG/ML IJ SOLN: 5.0000 mg | INTRAMUSCULAR | Status: DC | PRN

## 2017-05-11 MED ORDER — MEPERIDINE HCL 25 MG/ML IJ SOLN: 6.2500 mg | INTRAMUSCULAR | Status: DC | PRN

## 2017-05-11 MED ORDER — SIMETHICONE 80 MG PO CHEW
80.0000 mg | CHEWABLE_TABLET | Freq: Three times a day (TID) | ORAL | Status: DC
  Administered 2017-05-12 – 2017-05-13 (×5): 80 mg via ORAL
  Filled 2017-05-11 (×5): qty 1

## 2017-05-11 MED ORDER — IBUPROFEN 600 MG PO TABS
600.0000 mg | ORAL_TABLET | Freq: Four times a day (QID) | ORAL | Status: DC
  Administered 2017-05-11 – 2017-05-13 (×9): 600 mg via ORAL
  Filled 2017-05-11 (×10): qty 1

## 2017-05-11 MED ORDER — NALOXONE HCL 0.4 MG/ML IJ SOLN: 0.4000 mg | INTRAMUSCULAR | Status: DC | PRN

## 2017-05-11 MED ORDER — NALBUPHINE HCL 10 MG/ML IJ SOLN: 5.0000 mg | Freq: Once | INTRAMUSCULAR | Status: DC | PRN

## 2017-05-11 MED ORDER — PRENATAL MULTIVITAMIN CH
1.0000 | ORAL_TABLET | Freq: Every day | ORAL | Status: DC
  Administered 2017-05-12 – 2017-05-13 (×2): 1 via ORAL
  Filled 2017-05-11 (×2): qty 1

## 2017-05-11 MED ORDER — HYDROMORPHONE HCL 1 MG/ML IJ SOLN: 0.2500 mg | INTRAMUSCULAR | Status: DC | PRN

## 2017-05-11 MED ORDER — DIPHENHYDRAMINE HCL 50 MG/ML IJ SOLN: 12.5000 mg | INTRAMUSCULAR | Status: DC | PRN

## 2017-05-11 MED ORDER — ACETAMINOPHEN 500 MG PO TABS
1000.0000 mg | ORAL_TABLET | Freq: Four times a day (QID) | ORAL | Status: AC
  Administered 2017-05-11 (×2): 1000 mg via ORAL
  Filled 2017-05-11 (×2): qty 2

## 2017-05-11 MED ORDER — SCOPOLAMINE 1 MG/3DAYS TD PT72
1.0000 | MEDICATED_PATCH | Freq: Once | TRANSDERMAL | Status: DC
  Filled 2017-05-11: qty 1

## 2017-05-11 MED ORDER — SENNOSIDES-DOCUSATE SODIUM 8.6-50 MG PO TABS
2.0000 | ORAL_TABLET | ORAL | Status: DC
  Administered 2017-05-12: 2 via ORAL
  Filled 2017-05-11: qty 2

## 2017-05-11 MED ORDER — SODIUM CHLORIDE 0.9% FLUSH: 3.0000 mL | INTRAVENOUS | Status: DC | PRN

## 2017-05-11 MED ORDER — OXYTOCIN 40 UNITS IN LACTATED RINGERS INFUSION - SIMPLE MED: 2.5000 [IU]/h | INTRAVENOUS | Status: AC

## 2017-05-11 MED ORDER — PROMETHAZINE HCL 25 MG/ML IJ SOLN: 6.2500 mg | INTRAMUSCULAR | Status: DC | PRN

## 2017-05-11 MED ORDER — DIPHENHYDRAMINE HCL 25 MG PO CAPS: 25.0000 mg | ORAL_CAPSULE | ORAL | Status: DC | PRN

## 2017-05-11 MED ORDER — ZOLPIDEM TARTRATE 5 MG PO TABS: 5.0000 mg | ORAL_TABLET | Freq: Every evening | ORAL | Status: DC | PRN

## 2017-05-11 MED ORDER — TETANUS-DIPHTH-ACELL PERTUSSIS 5-2.5-18.5 LF-MCG/0.5 IM SUSP: 0.5000 mL | Freq: Once | INTRAMUSCULAR | Status: DC

## 2017-05-11 MED ORDER — SIMETHICONE 80 MG PO CHEW
80.0000 mg | CHEWABLE_TABLET | ORAL | Status: DC
  Administered 2017-05-12: 80 mg via ORAL
  Filled 2017-05-11: qty 1

## 2017-05-11 MED ORDER — DIPHENHYDRAMINE HCL 25 MG PO CAPS: 25.0000 mg | ORAL_CAPSULE | Freq: Four times a day (QID) | ORAL | Status: DC | PRN

## 2017-05-11 MED ORDER — OXYCODONE HCL 5 MG PO TABS: 10.0000 mg | ORAL_TABLET | ORAL | Status: DC | PRN

## 2017-05-11 MED ORDER — IBUPROFEN 600 MG PO TABS: 600.0000 mg | ORAL_TABLET | Freq: Four times a day (QID) | ORAL | Status: DC | PRN

## 2017-05-11 MED ORDER — COCONUT OIL OIL
1.0000 "application " | TOPICAL_OIL | Status: DC | PRN
  Administered 2017-05-11: 1 via TOPICAL
  Filled 2017-05-11: qty 120

## 2017-05-11 MED ORDER — ONDANSETRON HCL 4 MG/2ML IJ SOLN
4.0000 mg | Freq: Three times a day (TID) | INTRAMUSCULAR | Status: DC | PRN
  Administered 2017-05-11: 4 mg via INTRAVENOUS
  Filled 2017-05-11: qty 2

## 2017-05-11 MED ORDER — LACTATED RINGERS IV SOLN
INTRAVENOUS | Status: DC
  Administered 2017-05-11 (×2): via INTRAVENOUS

## 2017-05-11 MED ORDER — WITCH HAZEL-GLYCERIN EX PADS: 1.0000 "application " | MEDICATED_PAD | CUTANEOUS | Status: DC | PRN

## 2017-05-11 MED ORDER — DIBUCAINE 1 % RE OINT: 1.0000 "application " | TOPICAL_OINTMENT | RECTAL | Status: DC | PRN

## 2017-05-11 NOTE — Progress Notes (Signed)
MOB was referred for history of depression/anxiety. * Referral screened out by Clinical Social Worker because none of the following criteria appear to apply: ~ History of anxiety/depression during this pregnancy, or of post-partum depression. ~ Diagnosis of anxiety and/or depression within last 3 years OR * MOB's symptoms currently being treated with medication and/or therapy.  CSW provided education regarding Baby Blues vs PMADs and provided MOB with information about support groups held at [facility].  CSW encouraged MOB to evaluate her mental health throughout the postpartum period with the use of the New Mom Checklist developed by Postpartum Progress and notify a medical professional if symptoms arise.    Blaine HamperAngel Boyd-Gilyard, MSW, LCSW Clinical Social Work [phone]

## 2017-05-11 NOTE — Progress Notes (Signed)
Post Partum Day #1 Subjective: tolerating PO  Objective: Blood pressure 120/62, pulse 61, temperature 97.8 F (36.6 C), temperature source Oral, resp. rate 16, height 5\' 7"  (1.702 m), weight 103.9 kg (229 lb), last menstrual period 08/03/2016, SpO2 100 %, unknown if currently breastfeeding.  Physical Exam:  General: alert Lochia: appropriate Uterine Fundus: firm Incision: Dressing: c/d/i DVT Evaluation: No evidence of DVT seen on physical exam.   Recent Labs  05/10/17 0435 05/11/17 0526  HGB 11.5* 11.6*  HCT 33.6* 32.8*    Assessment/Plan: Continue current care   LOS: 1 day   Jceon Alverio C Lanie Schelling 05/11/2017, 7:26 AM

## 2017-05-11 NOTE — Lactation Note (Signed)
This note was copied from a baby's chart. Lactation Consultation Note: This is mother's 3rd child and first one to breastfeed. Infant is 5614 hours old and has had 3 feedings. Mother reports that she has been leaking colostrum during pregnancy. She is able to hand express small drops. Mother assist with placing infant in football hold . Mother has large breast with tiny nipple. Infant latched on for 20 mins with short burst of suckling and a few swallows. When infant released the breast mothers nipple was pinched.  Mother taught to use a tea-cup hold to get infant latched. Discussed possible use of a nipple shield. Mother will page if she wants to try a nipple shield. Mother is a [employer], [employer] and want to get her pump while she is here. Advised mother to [facility] and decide which pump she wants. Advised mother to continue to do skin to skin and to cue base feed infant . Mother to breastfeed infant at least 8-12 times in 24 hours. Mother informed of available LC services.  Patient Name: Boy Garald BraverJessica Jester ZOXWR'UToday's Date: 05/11/2017 Reason for consult: Initial assessment   Maternal Data    Feeding Feeding Type: Breast Fed Length of feed: 20 min  LATCH Score/Interventions Latch: Repeated attempts needed to sustain latch, nipple held in mouth throughout feeding, stimulation needed to elicit sucking reflex. Intervention(s): Adjust position;Assist with latch  Audible Swallowing: A few with stimulation Intervention(s): Skin to skin;Hand expression  Type of Nipple: Everted at rest and after stimulation (very small nipples with compressible tissue)  Comfort (Breast/Nipple): Soft / non-tender     Hold (Positioning): Assistance needed to correctly position infant at breast and maintain latch. Intervention(s): Breastfeeding basics reviewed;Support Pillows;Position options;Skin to skin  LATCH Score: 7  Lactation Tools Discussed/Used     Consult Status Consult Status:  Follow-up Date: 05/11/17 Follow-up type: In-patient    Stevan BornKendrick, Tej [facility] 05/11/2017, 12:31 PM

## 2017-05-11 NOTE — Plan of Care (Signed)
Problem: Life Cycle: Goal: Risk for postpartum hemorrhage will decrease Outcome: Progressing Attempted orthostatic blood pressures, pt unable to stand up long enough to take standing orthostatic, reported dizziness upon moving

## 2017-05-11 NOTE — Addendum Note (Signed)
Addendum  created 05/11/17 0839 by Angela AdamWrinkle, Rea Kalama G, CRNA   Sign clinical note

## 2017-05-11 NOTE — Anesthesia Postprocedure Evaluation (Signed)
Anesthesia Post Note  Patient: Sarah BraverJessica Sutton  Procedure(s) Performed: Procedure(s) (LRB): CESAREAN SECTION (N/A)     Patient location during evaluation: Mother Baby Anesthesia Type: Epidural Level of consciousness: awake and alert, oriented and patient cooperative Pain management: pain level controlled Vital Signs Assessment: post-procedure vital signs reviewed and stable Respiratory status: spontaneous breathing Cardiovascular status: stable Postop Assessment: epidural receding and patient able to bend at knees Anesthetic complications: no Comments: Pain score 1.  Pt reports small headache.  Able to drink water but experienced nausea when eating crackers.  Zofran given and pt no longer nauseated.      Last Vitals:  Vitals:   05/11/17 0230 05/11/17 0610  BP: (!) 118/54   Pulse: 62   Resp: 18 16  Temp: 36.6 C 36.6 C    Last Pain:  Vitals:   05/11/17 0610  TempSrc: Oral  PainSc: 0-No pain   Pain Goal:                 [facility],Seila Liston

## 2017-05-12 NOTE — Plan of Care (Signed)
Problem: Activity: Goal: Will verbalize the importance of balancing activity with adequate rest periods Pt walks around the unit with appropriate breaks.

## 2017-05-12 NOTE — Progress Notes (Signed)
POSTOPERATIVE DAY # 2 S/P c-section   S:         Reports feeling well             Tolerating po intake / no nausea / no vomiting / positive  flatus / no BM             Bleeding is light             Pain controlled withibuprofen (OTC)             Up ad lib / ambulatory/ voiding QS  Newborn breast and bottle feeding  / Circumcision planned for inpatient      O:  VS: BP 120/61 (BP Location: Left Arm)   Pulse (!) 59   Temp 98 F (36.7 C) (Oral)   Resp 16   Ht 5\' 7"  (1.702 m)   Wt 229 lb (103.9 kg)   LMP 08/03/2016 (Approximate)   SpO2 100%   Breastfeeding? Unknown   BMI 35.87 kg/m    LABS:               Recent Labs  05/10/17 0435 05/11/17 0526  WBC 8.9 18.2*  HGB 11.5* 11.6*  PLT 267 265               Bloodtype: --/--/A POS (06/14 0435)  Rubella: 7.02 (11/20 1512)                     EBL:                          I&O: Intake/Output      06/15 0701 - 06/16 0700 06/16 0701 - 06/17 0700   P.O. 720    I.V. (mL/kg)     Total Intake(mL/kg) 720 (6.9)    Urine (mL/kg/hr) 1600 (0.6)    Emesis/NG output     Blood     Total Output 1600     Net -880                       Physical Exam:             Alert and Oriented X3  Lungs: Clear and unlabored  Heart: regular rate and rhythm / no mumurs  Abdomen: soft, non-tender, non-distended              Fundus: firm, non-tender, U-1             Dressing clean dry and intact; honeycomb not in place but rather pressure dressing. Small amount of blood at bottom middle of the dressing which appears to be from her vagina.              Lochia: mild  Extremities: patient complaints of some pain from her calves to her thighs; the pain feels muscular and she has been feeling it for two weeks.   Reason for C/S? Repeat   A:        POD # 2 S/P repeat C/Section              P:        Routine postoperative care              Follow up in 6 weeks with [place]  Anticipate discharge on 05/13/2017    Charlesetta GaribaldiKathryn Lorraine Kooistra CNM 05/12/2017,  12:44 PM

## 2017-05-12 NOTE — Progress Notes (Signed)
TC from RN reporting moderate bloody drainage from underneath Pressure/Honeycomb dressing.  Pressure & Honeycomb dressings removed without difficulty.  2 cm opening of RT side of C/S incision draining serosanguinous fluid.  More serosanguinous fluid expressed with palpation.  No tunneling palpated.  No erythema, edema or foul smell. Skin around incision cleaned with sterile gauze. Steri-strips placed over 2 cm opening on RT side of incision.  Honeycomb changed using sterile technique.  Patient tolerated procedure well.  Sarah MoraRolitta Azra Abrell, CNM 05/11/2017 2:00 PM

## 2017-05-12 NOTE — Lactation Note (Signed)
This note was copied from a baby's chart. Lactation Consultation Note  Patient Name: Sarah Garald BraverJessica Sutton VHQIO'NToday's Date: 05/12/2017 Reason for consult: Follow-up assessment Mom states she stopped putting baby to breast because he wasn't satisfied..  She is pumping every 3 hours and obtaining drops.  Recommended mom try to put her baby to breast with feeding cues and then give formula supplementation.  Mom agreeable to latch assist.  Mom placed baby in football hold and hand expressed large drops of colostrum.  Baby opened wide and latched easily.  Instructed to use breast massage/compression during feeding.  Mom is an employee and will get a medela pump in style tomorrow before discharge..  Maternal Data    Feeding Feeding Type: Breast Fed  LATCH Score/Interventions Latch: Grasps breast easily, tongue down, lips flanged, rhythmical sucking. Intervention(s): Breast compression  Audible Swallowing: A few with stimulation Intervention(s): Skin to skin;Hand expression;Alternate breast massage  Type of Nipple: Everted at rest and after stimulation  Comfort (Breast/Nipple): Soft / non-tender     Hold (Positioning): No assistance needed to correctly position infant at breast. Intervention(s): Breastfeeding basics reviewed;Support Pillows;Position options;Skin to skin  LATCH Score: 9  Lactation Tools Discussed/Used Pump Review: Setup, frequency, and cleaning;Milk Storage Initiated by:: RN Date initiated:: 05/13/17   Consult Status Consult Status: Follow-up Date: 05/13/17 Follow-up type: In-patient    Huston FoleyMOULDEN, Teja Costen S 05/12/2017, 3:10 PM

## 2017-05-13 MED ORDER — OXYCODONE HCL 5 MG PO TABS: 5.0000 mg | ORAL_TABLET | ORAL | 0 refills | Status: DC | PRN

## 2017-05-13 MED ORDER — DOCUSATE SODIUM 100 MG PO CAPS: 100.0000 mg | ORAL_CAPSULE | Freq: Two times a day (BID) | ORAL | 2 refills | Status: DC | PRN

## 2017-05-13 MED ORDER — IBUPROFEN 600 MG PO TABS: 600.0000 mg | ORAL_TABLET | Freq: Four times a day (QID) | ORAL | 2 refills | Status: DC

## 2017-05-13 NOTE — Discharge Summary (Signed)
OB Discharge Summary     Patient Name: Sarah BraverJessica Sutton DOB: 06/29/1991 MRN: [id number]  Date of admission: 05/10/2017 Delivering MD: Adam PhenixARNOLD, JAMES G   Date of discharge: 05/13/2017  Admitting diagnosis: 38 WEEKS POSSIBLE ROM Intrauterine pregnancy: 3173w0d     Secondary diagnosis:  Active Problems:   Previous cesarean section complicating pregnancy   Prolonged rupture of membranes  Additional problems: None     Discharge diagnosis: Term Pregnancy Delivered                                                                                                Post partum procedures:None  Complications: None  Hospital course:  26 y.o. yo G3P1203 at 473w0d was admitted to the hospital 05/10/2017 in early labor and PROM. She declined trial of labor after cesarean section . The patient went for cesarean section due to Elective Repeat, and delivered a Viable infant Membrane Rupture Time/Date:)1:00 AM ,05/10/2017   Details of operation can be found in separate operative Note.  Patient had an uncomplicated postpartum course. She is ambulating, tolerating a regular diet, passing flatus, and urinating well.  Patient is discharged home in stable condition on 05/13/17.                                    Physical exam  Vitals:   05/12/17 0040 05/12/17 0557 05/12/17 1738 05/13/17 0615  BP: 114/64 120/61 128/66 129/62  Pulse: 68 (!) 59 74 66  Resp: 16 16 16 18   Temp: 97.8 F (36.6 C) 98 F (36.7 C) 98.3 F (36.8 C) 97.5 F (36.4 C)  TempSrc: Oral Oral Oral Oral  SpO2:   100% 100%  Weight:      Height:       General: alert, cooperative and no distress Lochia: appropriate Uterine Fundus: firm Incision: Healing well with some serosanguinous significant drainage.  Right side of incision with superior portion a little higher than the inferior portion. DVT Evaluation: No evidence of DVT seen on physical exam.  Negative Homan's sign.  Labs: CBC Latest Ref Rng & Units 05/11/2017 05/10/2017 03/15/2017  WBC  4.0 - 10.5 K/uL 18.2(H) 8.9 9.4  Hemoglobin 12.0 - 15.0 g/dL 11.6(L) 11.5(L) 11.0(L)  Hematocrit 36.0 - 46.0 % 32.8(L) 33.6(L) 33.2(L)  Platelets 150 - 400 K/uL 265 267 310    CMP Latest Ref Rng & Units 02/01/2017  Glucose 65 - 99 mg/dL 88  BUN 6 - 20 mg/dL 3(L)  Creatinine 6.440.57 - 1.00 mg/dL 0.34(V0.40(L)  Sodium 425134 - 956144 mmol/L 139  Potassium 3.5 - 5.2 mmol/L 3.7  Chloride 96 - 106 mmol/L 101  CO2 18 - 29 mmol/L 22  Calcium 8.7 - 10.2 mg/dL 3.8(V8.4(L)  Total Protein 6.0 - 8.5 g/dL 6.1  Total Bilirubin 0.0 - 1.2 mg/dL <5.6<0.2  Alkaline Phos 39 - 117 IU/L 70  AST 0 - 40 IU/L 13  ALT 0 - 32 IU/L 13    Discharge instruction: per After Visit Summary and "Baby and Me Booklet".  After visit meds:  Allergies as of 05/13/2017   No Known Allergies     Medication List    TAKE these medications   docusate sodium 100 MG capsule Commonly known as:  COLACE Take 1 capsule (100 mg total) by mouth 2 (two) times daily as needed.   ibuprofen 600 MG tablet Commonly known as:  ADVIL,MOTRIN Take 1 tablet (600 mg total) by mouth every 6 (six) hours.   oxyCODONE 5 MG immediate release tablet Commonly known as:  Oxy IR/ROXICODONE Take 1 tablet (5 mg total) by mouth every 4 (four) hours as needed (pain scale 4-7).       Diet: routine diet  Activity: Advance as tolerated. Pelvic rest for 6 weeks.   Outpatient follow up:  Incision check in 1-2 weeks.  Then 4-6 weeks: Postpartum check  Postpartum contraception: IUD Mirena  Newborn Data: Live born female  Birth Weight: 6 lb 14.8 oz (3140 g) APGAR: 9, 10  On day of discharge, patient desired circumcision for her female infant.  Circumcision procedure details discussed, risks and benefits of procedure were also discussed.  These include but are not limited to: Benefits of circumcision in men include reduction in the rates of urinary tract infection (UTI), penile cancer, some sexually transmitted infections, penile inflammatory and retractile disorders, as  well as easier hygiene.  Risks include bleeding , infection, injury of glans which may lead to penile deformity or urinary tract issues, unsatisfactory cosmetic appearance and other potential complications related to the procedure.  It was emphasized that this is an elective procedure.  Patient wanted to proceed with circumcision; written informed consent obtained.  Circumcision performed, refer to procedure note in baby's chart for more details.  Baby Feeding: Breast Disposition: Home with mother   05/13/2017 Jaynie Collins, MD

## 2017-05-13 NOTE — Discharge Instructions (Signed)

## 2017-05-13 NOTE — Lactation Note (Signed)
This note was copied from a baby's chart. Lactation Consultation Note  Patient Name: Sarah Garald BraverJessica Dubow ZOXWR'UToday's Date: 05/13/2017  Mom states breastfeeding is going well.  Milk is coming in.  Mom continues to post pump and is obtaining transitional milk.  Medela metro pump given to mom at discharge.  Reviewed lactation outpatient services and support and encouraged to call prn.   Maternal Data    Feeding    LATCH Score/Interventions                      Lactation Tools Discussed/Used     Consult Status      Huston FoleyMOULDEN, Capria Cartaya S 05/13/2017, 9:38 AM

## 2017-05-13 NOTE — Progress Notes (Addendum)
Pt scored a 9 on the Edinburgh Postnatal depression scale. Pt seen by social work previously on 05/10/2017. Pt declined seeing social work again at this time. Shanille Ward  with social work notified of pt score of 9. No further intervention needed at this time, Will continue to monitor pt.

## 2017-05-17 ENCOUNTER — Encounter: Payer: Self-pay | Admitting: Obstetrics and Gynecology

## 2017-05-17 DIAGNOSIS — Z3493 Encounter for supervision of normal pregnancy, unspecified, third trimester: Secondary | ICD-10-CM

## 2017-05-18 ENCOUNTER — Encounter (HOSPITAL_COMMUNITY): Payer: Self-pay | Admitting: *Deleted

## 2017-05-18 ENCOUNTER — Inpatient Hospital Stay (HOSPITAL_COMMUNITY)
Admission: AD | Admit: 2017-05-18 | Discharge: 2017-05-18 | Disposition: A | Discharge status: Home / Self Care | Payer: 59 | Source: Ambulatory Visit | Attending: Family Medicine | Admitting: Family Medicine

## 2017-05-18 DIAGNOSIS — O8622 Infection of bladder following delivery: Secondary | ICD-10-CM | POA: Diagnosis not present

## 2017-05-18 DIAGNOSIS — O909 Complication of the puerperium, unspecified: Secondary | ICD-10-CM

## 2017-05-18 DIAGNOSIS — O9089 Other complications of the puerperium, not elsewhere classified: Secondary | ICD-10-CM | POA: Insufficient documentation

## 2017-05-18 LAB — URINALYSIS, ROUTINE W REFLEX MICROSCOPIC
BACTERIA UA: NONE SEEN
Bilirubin Urine: NEGATIVE
Glucose, UA: NEGATIVE mg/dL
Ketones, ur: NEGATIVE mg/dL
Nitrite: NEGATIVE
PROTEIN: 30 mg/dL — AB
Specific Gravity, Urine: 1.017 (ref 1.005–1.030)
pH: 7 (ref 5.0–8.0)

## 2017-05-18 MED ORDER — CEPHALEXIN 500 MG PO CAPS: 500.0000 mg | ORAL_CAPSULE | Freq: Four times a day (QID) | ORAL | 0 refills | Status: DC

## 2017-05-18 MED ORDER — CEPHALEXIN 500 MG PO CAPS
500.0000 mg | ORAL_CAPSULE | Freq: Four times a day (QID) | ORAL | 0 refills | Status: DC
Start: 2017-05-18 — End: 2017-06-18

## 2017-05-18 NOTE — MAU Note (Signed)
Pt denies any chest tightness or SOB now; states that she just wants her incision to be checked; states that she has a light headache but she doesn't want any meds for a headache; having drainage from her incision and states that " her honeycomb fell off yesterday";

## 2017-05-18 NOTE — MAU Provider Note (Signed)
Chief Complaint: Chest Pain; Shortness of Breath; and Abdominal Pain   First Provider Initiated Contact with Patient 05/18/17 1323      SUBJECTIVE HPI: Sarah Sutton is a 26 y.o. [id number] POD#8 following RLTCS after TOLAC who presents to maternity admissions reporting pain and drainage from her incision, recent shortness of breath and chest tightness, and swelling in her feet and ankles since surgery.  She reports she called the office 2 days ago and was told to come to the hospital with her symptoms but she was unable to come in at that time because of her children. Her chest tightness resolved spontaneously and her shortness of breath improved but did not resolve completely.  She reports she had a BM yesterday and feels better overall but does still have shortness of breath. The main reason she came in was because of pain at the incision. She reports she left her honeycomb dressing on until it came off as she was instructed and it was peeling off for several days now.  When it came off today she noticed a small open area on the right side of the incision and some yellow drainage.  She reports some sharp lower abdominal pain associated with this.  She also reports burning with urination since she left the hospital. She assumed it was related to her Foley catheter but it has persisted and is not improving.  She is taking ibuprofen and Tylenol only for pain, and is unable to take narcotic medications and take care of her other children. She is breastfeeding and pumping and denies any complications or pain in her breasts.  She denies n/v or fever/chills.  There are no other associated symptoms.    HPI  Past Medical History:  Diagnosis Date  . Abnormal Pap smear 2007   COLPO DONE;LAST PAP 03/2011 WAS NORMAL  . Anemia    NO MEDS IN PAST  . Heart murmur    AS A CHILD, never caused any problems  . NSVD (normal spontaneous vaginal delivery) 09/30/2012   Preterm delivery at 34 weeks; IOL for PPROM  .  Polycystic ovarian syndrome 2009   by Dr. Gaynell Face  . Preterm labor   . Prolonged rupture of membranes 05/10/2017   Past Surgical History:  Procedure Laterality Date  . CERVICAL CERCLAGE  07/01/2012   Procedure: CERCLAGE CERVICAL;  Surgeon: Esmeralda Arthur, MD;  Location: [facility];  Service: Gynecology;  Laterality: N/A;  . CERVICAL CERCLAGE N/A 09/01/2015   Procedure: CERCLAGE CERVICAL;  Surgeon: Jaymes Graff, MD;  Location: [facility];  Service: Gynecology;  Laterality: N/A;  . CERVICAL CERCLAGE N/A 12/15/2015   Procedure: CERCLAGE CERVICAL removal;  Surgeon: Jaymes Graff, MD;  Location: [facility];  Service: Obstetrics;  Laterality: N/A;  . CERVICAL CERCLAGE N/A 11/21/2016   Procedure: CERCLAGE CERVICAL;  Surgeon: Willodean Rosenthal, MD;  Location: [facility];  Service: Gynecology;  Laterality: N/A;  . CESAREAN SECTION N/A 12/15/2015   Procedure: CESAREAN SECTION;  Surgeon: Jaymes Graff, MD;  Location: [facility];  Service: Obstetrics;  Laterality: N/A;  . CESAREAN SECTION N/A 05/10/2017   Procedure: CESAREAN SECTION;  Surgeon: Adam Phenix, MD;  [facility];  Service: Obstetrics;  Laterality: N/A;  . CHOLECYSTECTOMY  november 2009  . COLPOSCOPY  2009  . WISDOM TOOTH EXTRACTION     ALL 4 REMOVED   Social History   Social History  . Marital status: Single    Spouse name: N/A  . Number of children: 0  . Years  of education: 13   Occupational History  . CNA [employer]   Social History Main Topics  . Smoking status: Former Smoker    Packs/day: 0.50    Years: 10.00    Types: Cigarettes    Quit date: 06/28/2015  . Smokeless tobacco: Former NeurosurgeonUser     Comment: quit smoking with found out about pregnancy  . Alcohol use No  . Drug use: Yes    Types: Marijuana     Comment: last use 08/20/15  . Sexual activity: Yes    Partners: Male    Birth control/ protection: None     Comment: EDD 06/10/2017   Other Topics Concern  . Not on file   Social History Narrative  .  No narrative on file   No current facility-administered medications on file prior to encounter.    Current Outpatient Prescriptions on File Prior to Encounter  Medication Sig Dispense Refill  . docusate sodium (COLACE) 100 MG capsule Take 1 capsule (100 mg total) by mouth 2 (two) times daily as needed. 30 capsule 2  . ibuprofen (ADVIL,MOTRIN) 600 MG tablet Take 1 tablet (600 mg total) by mouth every 6 (six) hours. 30 tablet 2  . oxyCODONE (OXY IR/ROXICODONE) 5 MG immediate release tablet Take 1 tablet (5 mg total) by mouth every 4 (four) hours as needed (pain scale 4-7). 30 tablet 0   No Known Allergies  ROS:  Review of Systems  Constitutional: Negative for chills, fatigue and fever.  Respiratory: Positive for shortness of breath.   Cardiovascular: Positive for leg swelling. Negative for chest pain.  Gastrointestinal: Positive for abdominal pain.  Genitourinary: Positive for dysuria and vaginal bleeding. Negative for difficulty urinating, flank pain, pelvic pain, vaginal discharge and vaginal pain.  Neurological: Negative for dizziness and headaches.  Psychiatric/Behavioral: Negative.      I have reviewed patient's Past Medical Hx, Surgical Hx, Family Hx, Social Hx, medications and allergies.   Physical Exam  Patient Vitals for the past 24 hrs:  BP Temp Temp src Pulse Resp SpO2  05/18/17 1138 133/77 98.6 F (37 C) Oral (!) 51 18 100 %   Constitutional: Well-developed, well-nourished female in no acute distress.  HEART: normal rate, heart sounds, regular rhythm RESP: normal effort, lung sounds clear and equal bilaterally GI: Abd soft, non-tender. Pos BS x 4, incision well approximated with small 0.5 cm open area with serous drainage noted, mild tenderness to palpation, no erythema or edema noted MS: Extremities nontender, no edema, normal ROM Neurologic: Alert and oriented x 4.  GU: Neg CVAT.    LAB RESULTS Results for orders placed or performed during the hospital encounter  of 05/18/17 (from the past 24 hour(s))  Urinalysis, Routine w reflex microscopic     Status: Abnormal   Collection Time: 05/18/17 12:28 PM  Result Value Ref Range   Color, Urine AMBER (A) YELLOW   APPearance CLOUDY (A) CLEAR   Specific Gravity, Urine 1.017 1.005 - 1.030   pH 7.0 5.0 - 8.0   Glucose, UA NEGATIVE NEGATIVE mg/dL   Hgb urine dipstick LARGE (A) NEGATIVE   Bilirubin Urine NEGATIVE NEGATIVE   Ketones, ur NEGATIVE NEGATIVE mg/dL   Protein, ur 30 (A) NEGATIVE mg/dL   Nitrite NEGATIVE NEGATIVE   Leukocytes, UA LARGE (A) NEGATIVE   RBC / HPF 6-30 0 - 5 RBC/hpf   WBC, UA TOO NUMEROUS TO COUNT 0 - 5 WBC/hpf   Bacteria, UA NONE SEEN NONE SEEN   Squamous Epithelial / LPF  0-5 (A) NONE SEEN   WBC Clumps PRESENT    Mucous PRESENT     --/--/A POS (06/14 0435)  IMAGING No results found.  MAU Management/MDM: Ordered labs and reviewed results.  Incision is well-appearing with no evidence of infection.  BP wnl today, BLE edema is minimal.  UTI likely with U/A results so will treat with Keflex QID x 7 days. Pt to f/u in office as scheduled. Return to MAU as needed for emergencies.  Pt stable at time of discharge.  ASSESSMENT 1. Postpartum complication   2. Infection of bladder following delivery   3. Cesarean section wound complications     PLAN Discharge home Allergies as of 05/18/2017   No Known Allergies     Medication List    TAKE these medications   cephALEXin 500 MG capsule Commonly known as:  KEFLEX Take 1 capsule (500 mg total) by mouth 4 (four) times daily.   docusate sodium 100 MG capsule Commonly known as:  COLACE Take 1 capsule (100 mg total) by mouth 2 (two) times daily as needed.   ibuprofen 600 MG tablet Commonly known as:  ADVIL,MOTRIN Take 1 tablet (600 mg total) by mouth every 6 (six) hours.   oxyCODONE 5 MG immediate release tablet Commonly known as:  Oxy IR/ROXICODONE Take 1 tablet (5 mg total) by mouth every 4 (four) hours as needed (pain scale  4-7).      Follow-up Information    [facility] Follow up.   Why:  As scheduled, or call for sooner appointment if symptoms persist.  Return to MAU as needed for emergencies. Contact information: [address] [place] [address] [phone]          Sharen Counter Certified Nurse-Midwife 05/18/2017  1:48 PM

## 2017-05-18 NOTE — MAU Note (Signed)
Pt had C/S on June 14, C/O chest tightness for the last 2 days, is in epigastric area, feels like something sitting on her chest.  Feels SOB with minimal exertion.  Feet have been swollen since Monday.  Abd dressing came off yesterday, has some drainage & increased pain.

## 2017-05-24 ENCOUNTER — Encounter: Payer: Self-pay | Admitting: Obstetrics and Gynecology

## 2017-05-28 ENCOUNTER — Encounter (INDEPENDENT_AMBULATORY_CARE_PROVIDER_SITE_OTHER): Payer: 59

## 2017-05-29 ENCOUNTER — Telehealth: Payer: Self-pay | Admitting: *Deleted

## 2017-05-29 NOTE — Telephone Encounter (Signed)
Attempt to contact pt or sig other regarding FMLA paperwork. FMLA is for pt sig other and will need further information in order to process paperwork.  LM on VM making pt aware to contact office to discuss papers.

## 2017-06-18 ENCOUNTER — Encounter: Payer: Self-pay | Admitting: Obstetrics and Gynecology

## 2017-06-18 ENCOUNTER — Ambulatory Visit (INDEPENDENT_AMBULATORY_CARE_PROVIDER_SITE_OTHER): Payer: 59 | Admitting: Obstetrics and Gynecology

## 2017-06-18 ENCOUNTER — Other Ambulatory Visit (HOSPITAL_COMMUNITY)
Admission: RE | Admit: 2017-06-18 | Discharge: 2017-06-18 | Disposition: A | Discharge status: Home / Self Care | Payer: 59 | Source: Ambulatory Visit | Attending: Obstetrics and Gynecology | Admitting: Obstetrics and Gynecology

## 2017-06-18 DIAGNOSIS — N751 Abscess of Bartholin's gland: Secondary | ICD-10-CM

## 2017-06-18 MED ORDER — AMLODIPINE BESYLATE 10 MG PO TABS: 10.0000 mg | ORAL_TABLET | Freq: Every day | ORAL | 1 refills | Status: AC

## 2017-06-18 MED ORDER — CEPHALEXIN 500 MG PO CAPS: 500.0000 mg | ORAL_CAPSULE | Freq: Four times a day (QID) | ORAL | 0 refills | Status: AC

## 2017-06-18 NOTE — Progress Notes (Signed)
Subjective:     Sarah BraverJessica Sutton is a 26 y.o. female who presents for a postpartum visit. She is 5 weeks postpartum following a low cervical transverse Cesarean section. I have fully reviewed the prenatal and intrapartum course. The delivery was at 37 gestational weeks due to PROM. Outcome: repeat cesarean section, low transverse incision. Anesthesia: spinal. Postpartum course has been uncomplicated. Baby's course has been uncomplicated. Baby is feeding by bottle - Similac Advance. Bleeding no bleeding. Bowel function is normal. Bladder function is normal. Patient is not sexually active. Contraception method is abstinence. Postpartum depression screening: negative.     Review of Systems Pertinent items are noted in HPI.   Objective:    There were no vitals taken for this visit.  General:  alert, cooperative and no distress   Breasts:  inspection negative, no nipple discharge or bleeding, no masses or nodularity palpable  Lungs: clear to auscultation bilaterally  Heart:  regular rate and rhythm  Abdomen: soft, non-tender; bowel sounds normal; no masses,  no organomegaly  Incision: healed completely. No erythema, induration or drainage   Vulva:  normal 1.5 cm left Bartholin abscess. Tender to touch. No area of fluctuance  Vagina: normal vagina, no discharge, exudate, lesion, or erythema  Cervix:  multiparous appearance  Corpus: normal size, contour, position, consistency, mobility, non-tender  Adnexa:  normal adnexa and no mass, fullness, tenderness  Rectal Exam: Not performed.        Assessment:     Normal postpartum exam. Pap smear not done at today's visit (due 09/2017).   Plan:    1. Contraception: abstinence. Patient is considering NuvaRing vs IUD for contraception. Patient is aware that NuvaRing is not an option with severely elevated BP 2. Patient is medically cleared to resume all activities of daily living Patient with HTN. Rx Norvasc provided  Patient with left Bartholin  abscess- Rx Keflex provided. Patient advised to apply warm compresses to area. RTC in 2 weeks for follow up or sooner if needed 3. Follow up in: 2 weeks or as needed.

## 2017-06-18 NOTE — Progress Notes (Deleted)
Post Partum Exam  Sarah BraverJessica Sutton is a 26 y.o. [phone] female who presents for a postpartum visit. She is 5 weeks postpartum following a low cervical transverse Cesarean section. I have fully reviewed the prenatal and intrapartum course. The delivery was at 37 gestational weeks.  Anesthesia: spinal. Postpartum course has been ***. Baby's course has been ***. Baby is feeding by bottle/ Similac Advance . Bleeding thin lochia. Bowel function is normal. Bladder function is normal. Patient is not sexually active. Contraception method is none. Postpartum depression screening:neg  {Common ambulatory SmartLinks:19316}  Review of Systems {ros; complete:30496}    Objective:  unknown if currently breastfeeding.  General:  {gen appearance:16600}   Breasts:  {breast exam:1202::"inspection negative, no nipple discharge or bleeding, no masses or nodularity palpable"}  Lungs: {lung exam:16931}  Heart:  {heart exam:5510}  Abdomen: {abdomen exam:16834}   Vulva:  {labia exam:12198}  Vagina: {vagina exam:12200}  Cervix:  {cervix exam:14595}  Corpus: {uterus exam:12215}  Adnexa:  {adnexa exam:12223}  Rectal Exam: {rectal/vaginal exam:12274}        Assessment:    *** postpartum exam. Pap smear {done:10129} at today's visit.   Plan:   1. Contraception: {method:5051} 2. *** 3. Follow up in: {1-10:13787} {time; units:19136} or as needed.

## 2017-06-18 NOTE — Addendum Note (Signed)
Addended by: Dalphine HandingGARDNER, Micael Barb L on: 06/18/2017 05:09 PM   Modules accepted: Orders

## 2017-06-20 LAB — CERVICOVAGINAL ANCILLARY ONLY
Bacterial vaginitis: NEGATIVE
Candida vaginitis: NEGATIVE
Chlamydia: NEGATIVE
NEISSERIA GONORRHEA: NEGATIVE
Trichomonas: NEGATIVE

## 2017-07-03 ENCOUNTER — Encounter: Payer: Self-pay | Admitting: Obstetrics and Gynecology

## 2017-07-03 ENCOUNTER — Ambulatory Visit (INDEPENDENT_AMBULATORY_CARE_PROVIDER_SITE_OTHER): Payer: 59 | Admitting: Obstetrics and Gynecology

## 2017-07-03 ENCOUNTER — Encounter: Payer: Self-pay | Admitting: *Deleted

## 2017-07-03 VITALS — BP 145/91 | HR 80 | Ht 67.0 in | Wt 206.0 lb

## 2017-07-03 DIAGNOSIS — N751 Abscess of Bartholin's gland: Secondary | ICD-10-CM | POA: Diagnosis not present

## 2017-07-03 MED ORDER — NORETHIN ACE-ETH ESTRAD-FE 1-20 MG-MCG(24) PO TABS: 1.0000 | ORAL_TABLET | Freq: Every day | ORAL | 11 refills | Status: AC

## 2017-07-03 NOTE — Progress Notes (Signed)
Pt states that area has gotten smaller. Denies any pain or drainage.

## 2017-07-03 NOTE — Progress Notes (Signed)
26 yo [id number] here for the evaluation of her bartholin's abscess. She reports that it has decreased in size and is no longer painful. She admits to not doing sitz bath or taking antibiotics as prescribed. She denies any drainage or abnormal discharge   Past Medical History:  Diagnosis Date  . Abnormal Pap smear 2007   COLPO DONE;LAST PAP 03/2011 WAS NORMAL  . Anemia    NO MEDS IN PAST  . Heart murmur    AS A CHILD, never caused any problems  . NSVD (normal spontaneous vaginal delivery) 09/30/2012   Preterm delivery at 34 weeks; IOL for PPROM  . Polycystic ovarian syndrome 2009   by Dr. Gaynell FaceMarshall  . Preterm labor   . Prolonged rupture of membranes 05/10/2017   Past Surgical History:  Procedure Laterality Date  . CERVICAL CERCLAGE  07/01/2012   Procedure: CERCLAGE CERVICAL;  Surgeon: Esmeralda ArthurSandra A Rivard, MD;  Location: [facility];  Service: Gynecology;  Laterality: N/A;  . CERVICAL CERCLAGE N/A 09/01/2015   Procedure: CERCLAGE CERVICAL;  Surgeon: Jaymes GraffNaima Dillard, MD;  Location: [facility];  Service: Gynecology;  Laterality: N/A;  . CERVICAL CERCLAGE N/A 12/15/2015   Procedure: CERCLAGE CERVICAL removal;  Surgeon: Jaymes GraffNaima Dillard, MD;  Location: [facility];  Service: Obstetrics;  Laterality: N/A;  . CERVICAL CERCLAGE N/A 11/21/2016   Procedure: CERCLAGE CERVICAL;  Surgeon: Willodean Rosenthalarolyn Harraway-Smith, MD;  Location: [facility];  Service: Gynecology;  Laterality: N/A;  . CESAREAN SECTION N/A 12/15/2015   Procedure: CESAREAN SECTION;  Surgeon: Jaymes GraffNaima Dillard, MD;  Location: [facility];  Service: Obstetrics;  Laterality: N/A;  . CESAREAN SECTION N/A 05/10/2017   Procedure: CESAREAN SECTION;  Surgeon: Adam PhenixArnold, James G, MD;  [facility];  Service: Obstetrics;  Laterality: N/A;  . CHOLECYSTECTOMY  november 2009  . COLPOSCOPY  2009  . WISDOM TOOTH EXTRACTION     ALL 4 REMOVED   Family History  Problem Relation Age of Onset  . Asthma Mother   . Diabetes Father   . Hypertension Father   . Cancer Maternal  Grandmother        bone cancer  . Cancer Paternal Grandmother        breast cancer   Social History  Substance Use Topics  . Smoking status: Former Smoker    Packs/day: 0.50    Years: 10.00    Types: Cigarettes    Quit date: 06/28/2015  . Smokeless tobacco: Former NeurosurgeonUser     Comment: quit smoking with found out about pregnancy  . Alcohol use No   ROS See pertinent in HPI Blood pressure (!) 145/91, pulse 80, height 5\' 7"  (1.702 m), weight 206 lb (93.4 kg), not currently breastfeeding.  GENERAL: Well-developed, well-nourished female in no acute distress.  ABDOMEN: Soft, nontender, nondistended. No organomegaly. PELVIC: Normal external female genitalia. Vagina is pink and rugated.  Left bartholin abscess measuring 1 cm. No area of fluctuance. Non tender to touch. Normal discharge.  EXTREMITIES: No cyanosis, clubbing, or edema, 2+ distal pulses.  A/P 26 yo with left bartholin abscess healing - Advised to complete antibiotics and to continue sitz bath and warm packs - Patient is cleared to return to work - She desires to delay IUD insertion until complete resolution of the abscess - Rx Loestrin provided until IUD insertion - RTC prn

## 2017-11-01 ENCOUNTER — Ambulatory Visit (INDEPENDENT_AMBULATORY_CARE_PROVIDER_SITE_OTHER): Payer: Medicaid Other

## 2017-11-01 DIAGNOSIS — Z3201 Encounter for pregnancy test, result positive: Secondary | ICD-10-CM

## 2017-11-01 DIAGNOSIS — Z3200 Encounter for pregnancy test, result unknown: Secondary | ICD-10-CM

## 2017-11-01 LAB — POCT URINE PREGNANCY: Preg Test, Ur: POSITIVE — AB

## 2017-11-01 NOTE — Progress Notes (Signed)
..   [name] presents today for UPT. She has no unusual complaints LMP: 08-14-17    OBJECTIVE: Appears well, in no apparent distress.  OB History    Gravida Para Term Preterm AB Living   3 3 1 2  0 3   SAB TAB Ectopic Multiple Live Births   0 0 0 0 3     Home UPT Result: Positive In-Office UPT result: Positive I have reviewed the patient's medical, obstetrical, social, and family histories, and medications.   ASSESSMENT: Positive pregnancy test  PLAN Prenatal care to be completed at: Patient will be moving; [facility] [employer].

## 2017-11-23 ENCOUNTER — Other Ambulatory Visit: Payer: Self-pay

## 2017-11-23 ENCOUNTER — Emergency Department (HOSPITAL_BASED_OUTPATIENT_CLINIC_OR_DEPARTMENT_OTHER): Payer: Medicaid Other

## 2017-11-23 ENCOUNTER — Emergency Department (HOSPITAL_BASED_OUTPATIENT_CLINIC_OR_DEPARTMENT_OTHER)
Admission: EM | Admit: 2017-11-23 | Discharge: 2017-11-23 | Disposition: A | Discharge status: Home / Self Care | Payer: Medicaid Other | Attending: Emergency Medicine | Admitting: Emergency Medicine

## 2017-11-23 ENCOUNTER — Encounter (HOSPITAL_BASED_OUTPATIENT_CLINIC_OR_DEPARTMENT_OTHER): Payer: Self-pay | Admitting: Emergency Medicine

## 2017-11-23 DIAGNOSIS — R1030 Lower abdominal pain, unspecified: Secondary | ICD-10-CM

## 2017-11-23 DIAGNOSIS — Z87891 Personal history of nicotine dependence: Secondary | ICD-10-CM | POA: Insufficient documentation

## 2017-11-23 DIAGNOSIS — N939 Abnormal uterine and vaginal bleeding, unspecified: Secondary | ICD-10-CM | POA: Diagnosis not present

## 2017-11-23 DIAGNOSIS — Z79899 Other long term (current) drug therapy: Secondary | ICD-10-CM | POA: Insufficient documentation

## 2017-11-23 DIAGNOSIS — R110 Nausea: Secondary | ICD-10-CM

## 2017-11-23 LAB — HCG, QUANTITATIVE, PREGNANCY: hCG, Beta Chain, Quant, S: 81640 m[IU]/mL — ABNORMAL HIGH (ref <5)

## 2017-11-23 LAB — CBC WITH DIFFERENTIAL/PLATELET
Basophils Absolute: 0 10*3/uL (ref 0.0–0.1)
Basophils Relative: 0 %
EOS ABS: 0.1 10*3/uL (ref 0.0–0.7)
EOS PCT: 1 %
HCT: 35.8 % — ABNORMAL LOW (ref 36.0–46.0)
HEMOGLOBIN: 12.3 g/dL (ref 12.0–15.0)
LYMPHS ABS: 1.8 10*3/uL (ref 0.7–4.0)
Lymphocytes Relative: 21 %
MCH: 29.6 pg (ref 26.0–34.0)
MCHC: 34.4 g/dL (ref 30.0–36.0)
MCV: 86.3 fL (ref 78.0–100.0)
MONO ABS: 0.5 10*3/uL (ref 0.1–1.0)
MONOS PCT: 6 %
NEUTROS PCT: 72 %
Neutro Abs: 6.3 10*3/uL (ref 1.7–7.7)
Platelets: 268 10*3/uL (ref 150–400)
RBC: 4.15 MIL/uL (ref 3.87–5.11)
RDW: 14.8 % (ref 11.5–15.5)
WBC: 8.7 10*3/uL (ref 4.0–10.5)

## 2017-11-23 LAB — COMPREHENSIVE METABOLIC PANEL
ALK PHOS: 55 U/L (ref 38–126)
ALT: 11 U/L — ABNORMAL LOW (ref 14–54)
ANION GAP: 9 (ref 5–15)
AST: 14 U/L — ABNORMAL LOW (ref 15–41)
Albumin: 3.4 g/dL — ABNORMAL LOW (ref 3.5–5.0)
BUN: <5 mg/dL — ABNORMAL LOW (ref 6–20)
CALCIUM: 8.7 mg/dL — AB (ref 8.9–10.3)
CO2: 22 mmol/L (ref 22–32)
Chloride: 105 mmol/L (ref 101–111)
Creatinine, Ser: 0.42 mg/dL — ABNORMAL LOW (ref 0.44–1.00)
GFR calc non Af Amer: >60 mL/min (ref >60)
Glucose, Bld: 87 mg/dL (ref 65–99)
Potassium: 3.7 mmol/L (ref 3.5–5.1)
SODIUM: 136 mmol/L (ref 135–145)
TOTAL PROTEIN: 6.9 g/dL (ref 6.5–8.1)
Total Bilirubin: 0.3 mg/dL (ref 0.3–1.2)

## 2017-11-23 LAB — URINALYSIS, ROUTINE W REFLEX MICROSCOPIC
Bilirubin Urine: NEGATIVE
Glucose, UA: NEGATIVE mg/dL
Hgb urine dipstick: NEGATIVE
Ketones, ur: NEGATIVE mg/dL
Leukocytes, UA: NEGATIVE
Nitrite: NEGATIVE
Protein, ur: NEGATIVE mg/dL
Specific Gravity, Urine: 1.02 (ref 1.005–1.030)
pH: 7 (ref 5.0–8.0)

## 2017-11-23 LAB — WET PREP, GENITAL
Sperm: NONE SEEN
TRICH WET PREP: NONE SEEN
YEAST WET PREP: NONE SEEN

## 2017-11-23 LAB — PREGNANCY, URINE: Preg Test, Ur: POSITIVE — AB

## 2017-11-23 LAB — HCG, SERUM, QUALITATIVE: Preg, Serum: POSITIVE — AB

## 2017-11-23 MED ORDER — ACETAMINOPHEN 325 MG PO TABS
650.0000 mg | ORAL_TABLET | Freq: Once | ORAL | Status: AC
  Administered 2017-11-23: 650 mg via ORAL
  Filled 2017-11-23: qty 2

## 2017-11-23 MED ORDER — ACETAMINOPHEN 325 MG PO TABS
ORAL_TABLET | ORAL | Status: AC
  Filled 2017-11-23: qty 2

## 2017-11-23 MED ORDER — METOCLOPRAMIDE HCL 10 MG PO TABS
5.0000 mg | ORAL_TABLET | Freq: Once | ORAL | Status: AC
  Administered 2017-11-23: 5 mg via ORAL
  Filled 2017-11-23: qty 1

## 2017-11-23 MED ORDER — DIPHENHYDRAMINE HCL 50 MG/ML IJ SOLN
25.0000 mg | Freq: Once | INTRAMUSCULAR | Status: AC
  Administered 2017-11-23: 25 mg via INTRAVENOUS
  Filled 2017-11-23: qty 1

## 2017-11-23 NOTE — ED Provider Notes (Addendum)
[facility] EMERGENCY DEPARTMENT Provider Note   CSN: [id number] Arrival date & time: 11/23/17  1045     History   Chief Complaint Chief Complaint  Patient presents with  . Abdominal Pain    HPI Sarah Sutton is a 26 y.o. female.  HPI 26 year old African-American female G4 P3 that presents to the emergency department today for evaluation of lower abdominal cramping and spotting earlier today.  She reports that she is approximately 13-[redacted] weeks pregnant.  She has not had any OB/GYN follow-up as of yet.  States that she has an appointment on 12/03/17 with her OB/GYN doctor.  The patient has a history of 3 pregnancies that have all 3 required cervical cerclage.  Patient reports 3 preterm pregnancies.  Patient has not started taking any prenatal vitamins that for the past 2-3 days she has had some intermittent lower abdominal cramping.  She also states that this morning she was having some spotting.  Patient has not taken anything for the pain prior to arrival.  Nothing makes her symptoms better or worse.  She denies any associated vaginal discharge, urinary symptoms.  Denies any associated fevers, chills, diarrhea.  Patient does report some nausea but denies any emesis.  She has not taken anything for her nausea prior to arrival.  Denies any loss of fluids or contractions. Has felt baby move.  Pt denies any fever, chill, ha, vision changes, lightheadedness, dizziness, congestion, neck pain, cp, sob, cough, urinary symptoms, change in bowel habits, melena, hematochezia, lower extremity paresthesias.  Past Medical History:  Diagnosis Date  . Abnormal Pap smear 2007   COLPO DONE;LAST PAP 03/2011 WAS NORMAL  . Anemia    NO MEDS IN PAST  . Heart murmur    AS A CHILD, never caused any problems  . NSVD (normal spontaneous vaginal delivery) 09/30/2012   Preterm delivery at 34 weeks; IOL for PPROM  . Polycystic ovarian syndrome 2009   by Dr. Gaynell Face  . Preterm labor   . Prolonged  rupture of membranes 05/10/2017    Patient Active Problem List   Diagnosis Date Noted  . Prolonged rupture of membranes 05/10/2017  . At risk for postpartum depression 04/08/2017  . [place] cerclage present, antepartum 02/01/2017  . Pyelectasis of fetus on prenatal ultrasound 01/10/2017  . History of incompetent cervix, currently pregnant 10/16/2016  . History of preterm delivery, currently pregnant 10/16/2016  . Short interval between pregnancies affecting pregnancy, antepartum 10/16/2016  . Previous cesarean section complicating pregnancy 10/16/2016  . Supervision of high risk pregnancy, antepartum 10/16/2016  . Obesity complicating pregnancy 10/16/2016    Past Surgical History:  Procedure Laterality Date  . CERVICAL CERCLAGE  07/01/2012   Procedure: CERCLAGE CERVICAL;  Surgeon: Esmeralda Arthur, MD;  Location: [facility];  Service: Gynecology;  Laterality: N/A;  . CERVICAL CERCLAGE N/A 09/01/2015   Procedure: CERCLAGE CERVICAL;  Surgeon: Jaymes Graff, MD;  Location: [facility];  Service: Gynecology;  Laterality: N/A;  . CERVICAL CERCLAGE N/A 12/15/2015   Procedure: CERCLAGE CERVICAL removal;  Surgeon: Jaymes Graff, MD;  Location: [facility];  Service: Obstetrics;  Laterality: N/A;  . CERVICAL CERCLAGE N/A 11/21/2016   Procedure: CERCLAGE CERVICAL;  Surgeon: Willodean Rosenthal, MD;  Location: [facility];  Service: Gynecology;  Laterality: N/A;  . CESAREAN SECTION N/A 12/15/2015   Procedure: CESAREAN SECTION;  Surgeon: Jaymes Graff, MD;  Location: [facility];  Service: Obstetrics;  Laterality: N/A;  . CESAREAN SECTION N/A 05/10/2017   Procedure: CESAREAN SECTION;  Surgeon: Adam Phenix,  MD;  Location: [facility];  Service: Obstetrics;  Laterality: N/A;  . CHOLECYSTECTOMY  november 2009  . COLPOSCOPY  2009  . WISDOM TOOTH EXTRACTION     ALL 4 REMOVED    OB History    Gravida Para Term Preterm AB Living   4 3 1 2  0 3   SAB TAB Ectopic Multiple Live Births   0 0 0 0 3       Home  Medications    Prior to Admission medications   Medication Sig Start Date End Date Taking? Authorizing Provider  amLODipine (NORVASC) 10 MG tablet Take 1 tablet (10 mg total) by mouth daily. 06/18/17   Constant, Peggy, MD  cephALEXin (KEFLEX) 500 MG capsule Take 1 capsule (500 mg total) by mouth 4 (four) times daily. Patient not taking: Reported on 07/03/2017 06/18/17   Constant, Peggy, MD  Norethindrone Acetate-Ethinyl Estrad-FE (LOESTRIN 24 FE) 1-20 MG-MCG(24) tablet Take 1 tablet by mouth daily. 07/03/17   Constant, Peggy, MD    Family History Family History  Problem Relation Age of Onset  . Asthma Mother   . Diabetes Father   . Hypertension Father   . Cancer Maternal Grandmother        bone cancer  . Cancer Paternal Grandmother        breast cancer    Social History Social History   Tobacco Use  . Smoking status: Former Smoker    Packs/day: 0.50    Years: 10.00    Pack years: 5.00    Types: Cigarettes    Last attempt to quit: 06/28/2015    Years since quitting: 2.4  . Smokeless tobacco: Former Neurosurgeon  . Tobacco comment: quit smoking with found out about pregnancy  Substance Use Topics  . Alcohol use: No  . Drug use: Yes    Types: Marijuana    Comment: last use 08/20/15     Allergies   Patient has no known allergies.   Review of Systems Review of Systems  Constitutional: Negative for chills and fever.  HENT: Negative for congestion.   Eyes: Negative for visual disturbance.  Respiratory: Negative for cough and shortness of breath.   Cardiovascular: Negative for chest pain.  Gastrointestinal: Positive for diarrhea and nausea. Negative for abdominal pain, blood in stool and vomiting.  Genitourinary: Positive for pelvic pain and vaginal bleeding. Negative for dysuria, flank pain, frequency, hematuria, urgency and vaginal discharge.  Musculoskeletal: Negative for arthralgias and myalgias.  Skin: Negative for rash.  Neurological: Negative for dizziness, syncope, weakness,  light-headedness, numbness and headaches.  Psychiatric/Behavioral: Negative for sleep disturbance. The patient is not nervous/anxious.      Physical Exam Updated Vital Signs BP 121/66 (BP Location: Right Arm)   Pulse 75   Temp 98.4 F (36.9 C) (Oral)   Resp 18   LMP  (LMP Unknown)   SpO2 99%   Physical Exam  Constitutional: She is oriented to person, place, and time. She appears well-developed and well-nourished.  Non-toxic appearance. No distress.  HENT:  Head: Normocephalic and atraumatic.  Nose: Nose normal.  Mouth/Throat: Oropharynx is clear and moist.  Eyes: Conjunctivae are normal. Pupils are equal, round, and reactive to light. Right eye exhibits no discharge. Left eye exhibits no discharge.  Neck: Normal range of motion. Neck supple.  Cardiovascular: Normal rate, regular rhythm, normal heart sounds and intact distal pulses. Exam reveals no gallop and no friction rub.  No murmur heard. Pulmonary/Chest: Effort normal and breath sounds normal. No stridor. No  respiratory distress. She has no wheezes. She has no rales. She exhibits no tenderness.  Abdominal: Soft. Bowel sounds are normal. There is no tenderness. There is no rigidity, no rebound, no guarding, no CVA tenderness, no tenderness at McBurney's point and negative Murphy's sign.  Genitourinary:  Genitourinary Comments: Chaperone present for exam. No external lesions, swelling, erythema, or rash of the labia. No erythema, discharge, bleeding, or lesions noted in the vaginal vault. No CMT tenderness, bleeding or friability. No adnexal tenderness, mass or fullness bilaterally. No inguinal adenopathy or hernia.    Musculoskeletal: Normal range of motion. She exhibits no tenderness.  Lymphadenopathy:    She has no cervical adenopathy.  Neurological: She is alert and oriented to person, place, and time.  Skin: Skin is warm and dry. Capillary refill takes less than 2 seconds.  Psychiatric: Her behavior is normal. Judgment and  thought content normal.  Nursing note and vitals reviewed.    ED Treatments / Results  Labs (all labs ordered are listed, but only abnormal results are displayed) Labs Reviewed  WET PREP, GENITAL - Abnormal; Notable for the following components:      Result Value   Clue Cells Wet Prep HPF POC PRESENT (*)    WBC, Wet Prep HPF POC MANY (*)    All other components within normal limits  PREGNANCY, URINE - Abnormal; Notable for the following components:   Preg Test, Ur POSITIVE (*)    All other components within normal limits  HCG, SERUM, QUALITATIVE - Abnormal; Notable for the following components:   Preg, Serum POSITIVE (*)    All other components within normal limits  CBC WITH DIFFERENTIAL/PLATELET - Abnormal; Notable for the following components:   HCT 35.8 (*)    All other components within normal limits  COMPREHENSIVE METABOLIC PANEL - Abnormal; Notable for the following components:   BUN <5 (*)    Creatinine, Ser 0.42 (*)    Calcium 8.7 (*)    Albumin 3.4 (*)    AST 14 (*)    ALT 11 (*)    All other components within normal limits  HCG, QUANTITATIVE, PREGNANCY - Abnormal; Notable for the following components:   hCG, Beta Chain, Quant, S 81,640 (*)    All other components within normal limits  URINALYSIS, ROUTINE W REFLEX MICROSCOPIC  GC/CHLAMYDIA PROBE AMP () NOT AT [facility]    EKG  EKG Interpretation None       Radiology [place] Ob Comp < 14 Wks  Result Date: 11/23/2017 CLINICAL DATA:  Abdominal cramping and spotting. EXAM: OBSTETRIC <14 WK US AND TRANSVAGINAL OB US TECHNIQUE: Both transabdominal and transvaginal ultrasound examinations were performed for complete evaluation of the gestation as well as the maternal uterus, adnexal regions, and pelvic cul-de-sac. Transvaginal technique was performed to assess early pregnancy. COMPARISON:  O 04/13/2017. FINDINGS: Intrauterine gestational sac: Single. Gestational sac appears to be in the lower uterine segment. Small  amount of fluid within the cervix cannot be excluded. These findings should be closely followed . Yolk sac:  Not visualized Embryo:  Visualized Cardiac Activity: Visualized Heart Rate: 152  bpm CRL:  7.2 cm 13 w   3 d                  US EDC: 05/28/2018 Subchorionic hemorrhage:  None visualized. Maternal uterus/adnexae: Small amount of fluid noted in the cervical canal. Cervix measures 2.3 cm. IMPRESSION: 1.  Single viable injury pregnancy at 13 weeks 3 days. 2. Gestational sac noted in  the lower uterine segment. Small amount of fluid noted in the cervical canal. These findings should be closely followed. Electronically Signed   By: Maisie Fus  Register   On: 11/23/2017 13:10   US Ob Transvaginal  Result Date: 11/23/2017 CLINICAL DATA:  Abdominal cramping and spotting. EXAM: OBSTETRIC <14 WK [place] AND TRANSVAGINAL OB US TECHNIQUE: Both transabdominal and transvaginal ultrasound examinations were performed for complete evaluation of the gestation as well as the maternal uterus, adnexal regions, and pelvic cul-de-sac. Transvaginal technique was performed to assess early pregnancy. COMPARISON:  O 04/13/2017. FINDINGS: Intrauterine gestational sac: Single. Gestational sac appears to be in the lower uterine segment. Small amount of fluid within the cervix cannot be excluded. These findings should be closely followed . Yolk sac:  Not visualized Embryo:  Visualized Cardiac Activity: Visualized Heart Rate: 152  bpm CRL:  7.2 cm 13 w   3 d                  [place] EDC: 05/28/2018 Subchorionic hemorrhage:  None visualized. Maternal uterus/adnexae: Small amount of fluid noted in the cervical canal. Cervix measures 2.3 cm. IMPRESSION: 1.  Single viable injury pregnancy at 13 weeks 3 days. 2. Gestational sac noted in the lower uterine segment. Small amount of fluid noted in the cervical canal. These findings should be closely followed. Electronically Signed   By: Maisie Fus  Register   On: 11/23/2017 13:10    Procedures Procedures  (including critical care time)  Medications Ordered in ED Medications  diphenhydrAMINE (BENADRYL) injection 25 mg (25 mg Intravenous Given 11/23/17 1211)  metoCLOPramide (REGLAN) tablet 5 mg (5 mg Oral Given 11/23/17 1212)  acetaminophen (TYLENOL) tablet 650 mg (650 mg Oral Given 11/23/17 1429)     Initial Impression / Assessment and Plan / ED Course  I have reviewed the triage vital signs and the nursing notes.  Pertinent labs & imaging results that were available during my care of the patient were reviewed by me and considered in my medical decision making (see chart for details).  Clinical Course as of Nov 24 1623  Fri Nov 23, 2017  1623 I did discuss with Dr. Earlene Plater who is on-call for OB/GYN at [facility].  She spoke with her attending concerning patient's ultrasound finding.  As she do not feel that patient needed any further intervention currently.  However she, she has recommended that patient come to their office for a cerclage next Thursday morning.  The patient will be notified by their office for an appointment time.  Reports n.p.o. after midnight the prior night.  Recommend Tylenol for pain but no further intervention at this time.  Patient was updated on this and was was given instructions.  [KL]    Clinical Course User Index [KL] Rise Mu, PA-C    Patient presents to the ED for evaluation of lower abdominal cramping, nausea, vaginal bleeding.  The patient states that she is [redacted] weeks pregnant.  Patient has not seen OB/GYN doctor or started prenatal care for this pregnancy.  She is G4 P3.  History of cervical incompetence that has required 3 3 cervical cerclage in the past.  Patient denies any associated fevers, chills, vomiting, change in bowel habits, vaginal discharge.  On exam patient is overall well-appearing and nontoxic.  Vital signs are reassuring.  Patient is afebrile, no tachycardia, no hypotension is noted.  Patient has no focal abdominal tenderness  to palpation.  No signs of peritonitis.  Pelvic exam was performed that shows no cervical  motion tenderness or adnexal tenderness.  No bleeding or discharge was noted.  Lab work is reassuring.  No leukocytosis.  Electrolytes appear at baseline and are reassuring.  Normal kidney and liver function.  UA shows no signs of infection.  Wet prep does reveal WBCs and clue cells are present however patient does not have any symptoms with this.  According to up-to-date guidelines asymptomatic back to vaginosis and pregnancy does not need to be treated.  This will be deferred to primary care at this time.  His nausea was treated with Reglan and Benadryl.  She states that this significantly helped her symptoms.  Her pain was treated with Tylenol which is only provided some minimal relief.  Ultrasound was obtained that shows a viable intrauterine pregnancy of 13 weeks and 3 days.  Patient's gestational sac is sitting low in the uterus that is consistent with her prior cervical incompetence.  The patient states that by this time she has had her cervical cerclage is.  Patient has not followed up with OB/GYN doctor.  Has follow-up in 1 week.  This does not need emergent intervention at this time however she needs close follow-up and have given her very strict return precautions.  Clinical presentation does not seem consistent with PID, ovarian torsion, appendicitis, diverticulitis.  Repeat abdominal exam shows no focal abdominal tenderness.  Patient tolerating p.o. fluids without any emesis.  Vital signs remained very reassuring.  Unknown cause of patient's symptoms.  Patient is requesting something stronger for her pain because Tylenol does not work for her.  I told her that during her pregnancy we are limited on pain medicine that she can get.  I also encouraged patient to try diet modifications and B6 with Unisom for her nausea.  I have also told her to start taking a prenatal vitamin as this is very important to her  baby's development.  Pt is hemodynamically stable, in NAD, & able to ambulate in the ED. Evaluation does not show pathology that would require ongoing emergent intervention or inpatient treatment. I explained the diagnosis to the patient. Pain has been managed & has no complaints prior to dc. Pt is comfortable with above plan and is stable for discharge at this time. All questions were answered prior to disposition. Strict return precautions for f/u to the ED were discussed. Encouraged follow up with PCP.  Pt was dicussed with Dr. Juleen ChinaKohut who is agreed with the above plan.   Final Clinical Impressions(s) / ED Diagnoses   Final diagnoses:  Lower abdominal pain  Nausea  Vaginal bleeding    ED Discharge Orders    None       Wallace KellerLeaphart, Kenneth T, PA-C 11/23/17 1442    Raeford RazorKohut, Stephen, MD 11/23/17 1539    Rise MuLeaphart, Kenneth T, PA-C 11/23/17 1624    Raeford RazorKohut, Stephen, MD 11/26/17 [phone]

## 2017-11-23 NOTE — Discharge Instructions (Addendum)
[facility] for the appointment time. Nothing to eat or drink after midnight on Wednesday.  It is still very important for you to follow-up with a primary care doctor.  [facility].  If you have any further vaginal bleeding or worsening pain please [facility].  Return to the ED with any worsening symptoms.     Your lab work has been reassuring.  Your urine shows no signs.  Have discussed the bacterial vaginosis and we will not treat at this time.  Please follow-up with your OB/GYN concerning this.  Your ultrasound does show a viable pregnancy of approximately 13 weeks and 3 days.  Have discussed that the gestational sac is lower in your uterus which is consistent with your prior history.  This needs to be closely followed by OB/GYN.  I would also recommend you start taking prenatal vitamins as this is very important to the development of your baby.  I would also recommend you start taking Unisom at night and vitamin B6 3 times a day for your nausea.  It is very important that you follow-up with OB/GYN doctor.  In terms of pain you may take Tylenol however you are very limited on pain medicine during her pregnancy.  If you develop any worsening symptoms including worsening pain, worsening vaginal bleeding, fevers, chills or vomiting return to the ED immediately for evaluation.

## 2017-11-23 NOTE — ED Notes (Signed)
Patient transported to Ultrasound 

## 2017-11-23 NOTE — ED Triage Notes (Signed)
Patient states that she is [redacted] weeks pregnant. For the last 2 -3 days she has had lower abdominal cramping - the patient now has spotting this am. She has not had any prenatal care with this pregnancy. This is her 4th

## 2017-11-26 ENCOUNTER — Encounter (HOSPITAL_COMMUNITY): Payer: Self-pay

## 2017-11-26 ENCOUNTER — Telehealth (HOSPITAL_COMMUNITY): Payer: Self-pay

## 2017-11-26 LAB — GC/CHLAMYDIA PROBE AMP (~~LOC~~) NOT AT ARMC
CHLAMYDIA, DNA PROBE: NEGATIVE
Neisseria Gonorrhea: NEGATIVE

## 2017-11-26 NOTE — Telephone Encounter (Signed)
-----   Message from Conan BowensKelly M Davis, MD sent at 11/23/2017  3:16 PM EST ----- Please schedule this patient [facility] Thursday [facility]. Also please call patient with appt details. Thanks.

## 2017-11-26 NOTE — Telephone Encounter (Signed)
Called patient to give her surgery date and time, no answer, unable to leave voicemail, voice mailbox is full.

## 2017-11-28 ENCOUNTER — Encounter (HOSPITAL_COMMUNITY): Payer: Self-pay | Admitting: Anesthesiology

## 2017-11-28 NOTE — Anesthesia Preprocedure Evaluation (Deleted)
Anesthesia Evaluation  Patient identified by MRN, date of birth, ID band Patient awake    Reviewed: Allergy & Precautions, NPO status , Patient's Chart, lab work & pertinent test results  Airway Mallampati: II  TM Distance: >3 FB Neck ROM: Full    Dental   Pulmonary former smoker,    breath sounds clear to auscultation       Cardiovascular negative cardio ROS   Rhythm:Regular Rate:Normal     Neuro/Psych negative neurological ROS     GI/Hepatic negative GI ROS, Neg liver ROS,   Endo/Other  negative endocrine ROS  Renal/GU negative Renal ROS     Musculoskeletal   Abdominal   Peds  Hematology negative hematology ROS (+)   Anesthesia Other Findings   Reproductive/Obstetrics (+) Pregnancy                             Lab Results  Component Value Date   WBC 8.7 11/23/2017   HGB 12.3 11/23/2017   HCT 35.8 (L) 11/23/2017   MCV 86.3 11/23/2017   PLT 268 11/23/2017   Lab Results  Component Value Date   CREATININE 0.42 (L) 11/23/2017   BUN <5 (L) 11/23/2017   NA 136 11/23/2017   K 3.7 11/23/2017   CL 105 11/23/2017   CO2 22 11/23/2017    Anesthesia Physical  Anesthesia Plan  ASA: II  Anesthesia Plan: Spinal   Post-op Pain Management:    Induction:   PONV Risk Score and Plan:   Airway Management Planned: Natural Airway  Additional Equipment:   Intra-op Plan:   Post-operative Plan:   Informed Consent: I have reviewed the patients History and Physical, chart, labs and discussed the procedure including the risks, benefits and alternatives for the proposed anesthesia with the patient or authorized representative who has indicated his/her understanding and acceptance.     Plan Discussed with: CRNA  Anesthesia Plan Comments:         Anesthesia Quick Evaluation

## 2017-11-29 ENCOUNTER — Ambulatory Visit (HOSPITAL_COMMUNITY): Admission: RE | Admit: 2017-11-29 | Payer: Self-pay | Source: Ambulatory Visit | Admitting: Obstetrics & Gynecology

## 2017-11-29 SURGERY — CERCLAGE, CERVIX, VAGINAL APPROACH
Anesthesia: Choice

## 2017-11-29 MED ORDER — INDOMETHACIN 50 MG RE SUPP
100.0000 mg | Freq: Once | RECTAL | Status: DC
  Filled 2017-11-29: qty 2

## 2017-11-29 MED ORDER — DEXTROSE 5 % IV SOLN
500.0000 mg | INTRAVENOUS | Status: DC
  Filled 2017-11-29 (×2): qty 500

## 2017-12-07 ENCOUNTER — Telehealth: Payer: Self-pay | Admitting: General Practice

## 2017-12-07 NOTE — Telephone Encounter (Signed)
Left message for patient to give our office a call back in regards to her NEW OB appointment.

## 2017-12-17 ENCOUNTER — Encounter: Payer: Self-pay | Admitting: Obstetrics & Gynecology

## 2020-03-26 ENCOUNTER — Encounter (HOSPITAL_BASED_OUTPATIENT_CLINIC_OR_DEPARTMENT_OTHER): Payer: Self-pay | Admitting: Emergency Medicine

## 2020-03-26 ENCOUNTER — Other Ambulatory Visit: Payer: Self-pay

## 2020-03-26 ENCOUNTER — Emergency Department (HOSPITAL_BASED_OUTPATIENT_CLINIC_OR_DEPARTMENT_OTHER)
Admission: EM | Admit: 2020-03-26 | Discharge: 2020-03-26 | Disposition: A | Discharge status: Home / Self Care | Payer: Medicaid Other | Attending: Emergency Medicine | Admitting: Emergency Medicine

## 2020-03-26 DIAGNOSIS — R220 Localized swelling, mass and lump, head: Secondary | ICD-10-CM | POA: Diagnosis present

## 2020-03-26 DIAGNOSIS — Z79899 Other long term (current) drug therapy: Secondary | ICD-10-CM | POA: Diagnosis not present

## 2020-03-26 DIAGNOSIS — T7840XA Allergy, unspecified, initial encounter: Secondary | ICD-10-CM | POA: Diagnosis not present

## 2020-03-26 DIAGNOSIS — Z87891 Personal history of nicotine dependence: Secondary | ICD-10-CM | POA: Insufficient documentation

## 2020-03-26 MED ORDER — FAMOTIDINE IN NACL 20-0.9 MG/50ML-% IV SOLN
20.0000 mg | Freq: Once | INTRAVENOUS | Status: AC
  Administered 2020-03-26: 20 mg via INTRAVENOUS
  Filled 2020-03-26: qty 50

## 2020-03-26 MED ORDER — DIPHENHYDRAMINE HCL 50 MG/ML IJ SOLN
25.0000 mg | Freq: Once | INTRAMUSCULAR | Status: AC
  Administered 2020-03-26: 14:00:00 25 mg via INTRAVENOUS
  Filled 2020-03-26: qty 1

## 2020-03-26 MED ORDER — DEXAMETHASONE SODIUM PHOSPHATE 10 MG/ML IJ SOLN
10.0000 mg | Freq: Once | INTRAMUSCULAR | Status: AC
  Administered 2020-03-26: 10 mg via INTRAMUSCULAR
  Filled 2020-03-26: qty 1

## 2020-03-26 NOTE — Discharge Instructions (Addendum)
1.  You have been given dose of Decadron.  This is a steroid shot.  It should start to be effective within the next 6 to 12 hours.  We will continue to work for several days. 2.  Take 25 to 50 mg of Benadryl every 6 hours for itching and swelling.  When symptoms are improved you may decrease your dose of Benadryl.  Also, take Pepcid 20 mg twice a day for the next 3 to 5 days. 3.  Try to think of anything that you might have used as a cosmetic type product or anything that may have gotten on your face and neck.  Some of the appearance of rash looks like a contact dermatitis. 4.  You will have to list amoxicillin as an allergy.  Also do not take ibuprofen, codeine or other medications at this time. 5.  Return to the emergency department if your symptoms are worsening despite treatment.

## 2020-03-26 NOTE — ED Provider Notes (Addendum)
[facility] EMERGENCY DEPARTMENT Provider Note   CSN: [id number] Arrival date & time: 03/26/20  1302     History Chief Complaint  Patient presents with  . Allergic Reaction    Sarah Sutton is a 29 y.o. female.  HPI Patient had an infected right upper tooth.  She was seen at the dental clinic in anticipation of getting it pulled 3 days ago.  Her blood pressure was very elevated when she was seen at the clinic.  They would not extract the tooth due to elevated blood pressure and put the patient on amoxicillin with plan to return.  He has been taking amoxicillin and some Tylenol with codeine for pain.  She reports that the pain has improved a lot but this morning she started to notice her face was getting swollen.  She has pictures and initially it appeared more like hives and patchy and since has become diffusely moderately swollen around her eyes and cheeks with a fine rash.  She reports her face is very itchy.  She also has itching up and around her upper chest and throat.  This morning she started to feel like it felt weird to swallow water and started to think that her throat might be closing.  She reports she not sure if she got anxious and that made it feel worse.  This time it is improving.  She has not had fevers or chills.  No cough.  She denies using any new products on her face.    Past Medical History:  Diagnosis Date  . Abnormal Pap smear 2007   COLPO DONE;LAST PAP 03/2011 WAS NORMAL  . Anemia    NO MEDS IN PAST  . Heart murmur    AS A CHILD, never caused any problems  . NSVD (normal spontaneous vaginal delivery) 09/30/2012   Preterm delivery at 34 weeks; IOL for PPROM  . Polycystic ovarian syndrome 2009   by Dr. Gaynell Face  . Preterm labor   . Prolonged rupture of membranes 05/10/2017    Patient Active Problem List   Diagnosis Date Noted  . Prolonged rupture of membranes 05/10/2017  . At risk for postpartum depression 04/08/2017  . McDonald cerclage present,  antepartum 02/01/2017  . Pyelectasis of fetus on prenatal ultrasound 01/10/2017  . History of incompetent cervix, currently pregnant 10/16/2016  . History of preterm delivery, currently pregnant 10/16/2016  . Short interval between pregnancies affecting pregnancy, antepartum 10/16/2016  . Previous cesarean section complicating pregnancy 10/16/2016  . Supervision of high risk pregnancy, antepartum 10/16/2016  . Obesity complicating pregnancy 10/16/2016    Past Surgical History:  Procedure Laterality Date  . CERVICAL CERCLAGE  07/01/2012   Procedure: CERCLAGE CERVICAL;  Surgeon: Esmeralda Arthur, MD;  Location: [facility];  Service: Gynecology;  Laterality: N/A;  . CERVICAL CERCLAGE N/A 09/01/2015   Procedure: CERCLAGE CERVICAL;  Surgeon: Jaymes Graff, MD;  Location: [facility];  Service: Gynecology;  Laterality: N/A;  . CERVICAL CERCLAGE N/A 12/15/2015   Procedure: CERCLAGE CERVICAL removal;  Surgeon: Jaymes Graff, MD;  Location: [facility];  Service: Obstetrics;  Laterality: N/A;  . CERVICAL CERCLAGE N/A 11/21/2016   Procedure: CERCLAGE CERVICAL;  Surgeon: Willodean Rosenthal, MD;  Location: [facility];  Service: Gynecology;  Laterality: N/A;  . CESAREAN SECTION N/A 12/15/2015   Procedure: CESAREAN SECTION;  Surgeon: Jaymes Graff, MD;  Location: [facility];  Service: Obstetrics;  Laterality: N/A;  . CESAREAN SECTION N/A 05/10/2017   Procedure: CESAREAN SECTION;  Surgeon: Adam Phenix, MD;  Location: [facility];  Service: Obstetrics;  Laterality: N/A;  . CHOLECYSTECTOMY  november 2009  . COLPOSCOPY  2009  . WISDOM TOOTH EXTRACTION     ALL 4 REMOVED     OB History    Gravida  4   Para  3   Term  1   Preterm  2   AB  0   Living  3     SAB  0   TAB  0   Ectopic  0   Multiple  0   Live Births  3           Family History  Problem Relation Age of Onset  . Asthma Mother   . Diabetes Father   . Hypertension Father   . Cancer Maternal Grandmother        bone cancer  .  Cancer Paternal Grandmother        breast cancer    Social History   Tobacco Use  . Smoking status: Former Smoker    Packs/day: 0.50    Years: 10.00    Pack years: 5.00    Types: Cigarettes    Quit date: 06/28/2015    Years since quitting: 4.7  . Smokeless tobacco: Former Systems developer  . Tobacco comment: quit smoking with found out about pregnancy  Substance Use Topics  . Alcohol use: No  . Drug use: Yes    Types: Marijuana    Comment: last use 08/20/15    Home Medications Prior to Admission medications   Medication Sig Start Date End Date Taking? Authorizing Provider  acetaminophen-codeine (TYLENOL #3) 300-30 MG tablet Take 1-2 tablets by mouth every 6 (six) hours as needed for moderate pain.   Yes [provider]  amoxicillin (AMOXIL) 500 MG tablet Take 500 mg by mouth in the morning, at noon, and at bedtime.   Yes [provider]  ibuprofen (ADVIL) 800 MG tablet Take 800 mg by mouth every 6 (six) hours as needed.   Yes [provider]  amLODipine (NORVASC) 10 MG tablet Take 1 tablet (10 mg total) by mouth daily. Patient not taking: Reported on 11/28/2017 06/18/17   Constant, Peggy, MD  cephALEXin (KEFLEX) 500 MG capsule Take 1 capsule (500 mg total) by mouth 4 (four) times daily. Patient not taking: Reported on 07/03/2017 06/18/17   Constant, Peggy, MD  Norethindrone Acetate-Ethinyl Estrad-FE (LOESTRIN 24 FE) 1-20 MG-MCG(24) tablet Take 1 tablet by mouth daily. Patient not taking: Reported on 11/28/2017 07/03/17   Constant, Peggy, MD    Allergies    Patient has no known allergies.  Review of Systems   Review of Systems 10 Systems reviewed and are negative for acute change except as noted in the HPI. Physical Exam Updated Vital Signs BP (!) 141/89 (BP Location: Right Arm)   Pulse 64   Temp 98.4 F (36.9 C) (Oral)   Resp 20   Ht 5\' 7"  (1.702 m)   Wt 99.5 kg   SpO2 100%   BMI 34.36 kg/m   Physical Exam Constitutional:      Comments: Patient is alert and  appropriate.  Nontoxic.  No respiratory distress.  HENT:     Head:     Comments: Patient has some diffuse edema of the periorbital area, cheeks and chin.  Eyes are open and swelling does not result in inability to open her eyes.  There is some fine rash on the chin and forehead.    Mouth/Throat:     Mouth: Mucous  membranes are moist.     Pharynx: Oropharynx is clear.     Comments: No intraoral swelling.  Posterior airway is widely patent.  Patient has a area of dental decay in the upper second molar.  This is nontender to palpation and there is no drainage or swelling around the tooth or tenderness of the adjacent maxilla.  No trismus. Eyes:     Extraocular Movements: Extraocular movements intact.     Conjunctiva/sclera: Conjunctivae normal.     Pupils: Pupils are equal, round, and reactive to light.  Cardiovascular:     Rate and Rhythm: Normal rate and regular rhythm.  Pulmonary:     Effort: Pulmonary effort is normal.     Breath sounds: Normal breath sounds.  Abdominal:     General: There is no distension.     Palpations: Abdomen is soft.     Tenderness: There is no abdominal tenderness. There is no guarding.  Musculoskeletal:        General: No swelling. Normal range of motion.     Cervical back: Neck supple.     Right lower leg: No edema.     Left lower leg: No edema.  Skin:    General: Skin is warm and dry.     Comments: Patient does not having diffuse full body rash.  Rash and swelling are limited to face  Neurological:     General: No focal deficit present.     Mental Status: She is oriented to person, place, and time.     Coordination: Coordination normal.  Psychiatric:        Mood and Affect: Mood normal.         ED Results / Procedures / Treatments   Labs (all labs ordered are listed, but only abnormal results are displayed) Labs Reviewed - No data to display  EKG None  Radiology No results found.  Procedures Procedures (including critical care  time)  Medications Ordered in ED Medications  famotidine (PEPCID) IVPB 20 mg premix (20 mg Intravenous New Bag/Given 03/26/20 1417)  diphenhydrAMINE (BENADRYL) injection 25 mg (25 mg Intravenous Given 03/26/20 1405)  dexamethasone (DECADRON) injection 10 mg (10 mg Intramuscular Given 03/26/20 1410)    ED Course  I have reviewed the triage vital signs and the nursing notes.  Pertinent labs & imaging results that were available during my care of the patient were reviewed by me and considered in my medical decision making (see chart for details).    MDM Rules/Calculators/A&P                     Patient has some diffuse generalized swelling of her face with fine rash that is pruritic.  This may be allergic reaction to amoxicillin.  We will have the patient discontinue the amoxicillin.  Her dental pain has completely resolved with 2 days of treatment.  Patient is given a dose of Benadryl, Pepcid and Decadron in the emergency department.  Appearance is somewhat suggestive of contact dermatitis although patient denies any use of new products or anything that she thinks could have precipitated contact dermatitis.  Will discharge with plan for Benadryl and Pepcid for several more days.  Return precautions reviewed. Final Clinical Impression(s) / ED Diagnoses Final diagnoses:  Allergic reaction, initial encounter    Rx / DC Orders ED Discharge Orders    None       Arby Barrette, MD 03/26/20 1445    Arby Barrette, MD 03/26/20 1453

## 2020-03-26 NOTE — ED Triage Notes (Signed)
Reports having a bad tooth on right upper.  Went to dentist a few days ago and was placed on pain meds and amoxicillin.  Now reports facial swelling and difficulty swallowing as well as feeling dizzy.
